# Patient Record
Sex: Male | Born: 1945 | Race: White | Hispanic: No | Smoking: Current every day smoker
Health system: Southern US, Community
[De-identification: ages and names within clinical notes are randomized; demographics above are authoritative.]

## PROBLEM LIST (undated history)

## (undated) DIAGNOSIS — Z8719 Personal history of other diseases of the digestive system: Secondary | ICD-10-CM

## (undated) DIAGNOSIS — Z720 Tobacco use: Secondary | ICD-10-CM

## (undated) DIAGNOSIS — I499 Cardiac arrhythmia, unspecified: Secondary | ICD-10-CM

## (undated) DIAGNOSIS — I951 Orthostatic hypotension: Secondary | ICD-10-CM

## (undated) DIAGNOSIS — R600 Localized edema: Secondary | ICD-10-CM

## (undated) DIAGNOSIS — C8590 Non-Hodgkin lymphoma, unspecified, unspecified site: Secondary | ICD-10-CM

## (undated) DIAGNOSIS — K219 Gastro-esophageal reflux disease without esophagitis: Secondary | ICD-10-CM

## (undated) DIAGNOSIS — K08109 Complete loss of teeth, unspecified cause, unspecified class: Secondary | ICD-10-CM

## (undated) DIAGNOSIS — M87052 Idiopathic aseptic necrosis of left femur: Secondary | ICD-10-CM

## (undated) DIAGNOSIS — J189 Pneumonia, unspecified organism: Secondary | ICD-10-CM

## (undated) DIAGNOSIS — Z8614 Personal history of Methicillin resistant Staphylococcus aureus infection: Secondary | ICD-10-CM

## (undated) DIAGNOSIS — G4733 Obstructive sleep apnea (adult) (pediatric): Secondary | ICD-10-CM

## (undated) DIAGNOSIS — G20 Parkinson's disease: Secondary | ICD-10-CM

## (undated) DIAGNOSIS — E669 Obesity, unspecified: Secondary | ICD-10-CM

## (undated) DIAGNOSIS — D049 Carcinoma in situ of skin, unspecified: Secondary | ICD-10-CM

## (undated) DIAGNOSIS — T8859XA Other complications of anesthesia, initial encounter: Secondary | ICD-10-CM

## (undated) DIAGNOSIS — M51369 Other intervertebral disc degeneration, lumbar region without mention of lumbar back pain or lower extremity pain: Secondary | ICD-10-CM

## (undated) DIAGNOSIS — T4145XA Adverse effect of unspecified anesthetic, initial encounter: Secondary | ICD-10-CM

## (undated) DIAGNOSIS — R519 Headache, unspecified: Secondary | ICD-10-CM

## (undated) DIAGNOSIS — M5136 Other intervertebral disc degeneration, lumbar region: Secondary | ICD-10-CM

## (undated) DIAGNOSIS — Z9889 Other specified postprocedural states: Secondary | ICD-10-CM

## (undated) DIAGNOSIS — K635 Polyp of colon: Secondary | ICD-10-CM

## (undated) DIAGNOSIS — C801 Malignant (primary) neoplasm, unspecified: Secondary | ICD-10-CM

## (undated) DIAGNOSIS — M87051 Idiopathic aseptic necrosis of right femur: Secondary | ICD-10-CM

## (undated) DIAGNOSIS — K8050 Calculus of bile duct without cholangitis or cholecystitis without obstruction: Secondary | ICD-10-CM

## (undated) DIAGNOSIS — I4891 Unspecified atrial fibrillation: Secondary | ICD-10-CM

## (undated) DIAGNOSIS — G20A1 Parkinson's disease without dyskinesia, without mention of fluctuations: Secondary | ICD-10-CM

## (undated) DIAGNOSIS — R112 Nausea with vomiting, unspecified: Secondary | ICD-10-CM

## (undated) DIAGNOSIS — D733 Abscess of spleen: Secondary | ICD-10-CM

## (undated) DIAGNOSIS — Z9981 Dependence on supplemental oxygen: Secondary | ICD-10-CM

## (undated) DIAGNOSIS — G709 Myoneural disorder, unspecified: Secondary | ICD-10-CM

## (undated) DIAGNOSIS — J449 Chronic obstructive pulmonary disease, unspecified: Secondary | ICD-10-CM

## (undated) HISTORY — PX: EYE SURGERY: SHX253

## (undated) HISTORY — PX: CATARACT EXTRACTION: SUR2

## (undated) HISTORY — PX: KNEE ARTHROSCOPY: SUR90

## (undated) HISTORY — DX: Obesity, unspecified: E66.9

## (undated) HISTORY — DX: Idiopathic aseptic necrosis of left femur: M87.052

## (undated) HISTORY — DX: Localized edema: R60.0

## (undated) HISTORY — DX: Other intervertebral disc degeneration, lumbar region: M51.36

## (undated) HISTORY — DX: Orthostatic hypotension: I95.1

## (undated) HISTORY — DX: Unspecified atrial fibrillation: I48.91

## (undated) HISTORY — DX: Chronic obstructive pulmonary disease, unspecified: J44.9

## (undated) HISTORY — DX: Abscess of spleen: D73.3

## (undated) HISTORY — PX: TONSILLECTOMY: SUR1361

## (undated) HISTORY — DX: Obstructive sleep apnea (adult) (pediatric): G47.33

## (undated) HISTORY — DX: Tobacco use: Z72.0

## (undated) HISTORY — DX: Idiopathic aseptic necrosis of right femur: M87.051

## (undated) HISTORY — PX: MULTIPLE TOOTH EXTRACTIONS: SHX2053

## (undated) HISTORY — DX: Personal history of Methicillin resistant Staphylococcus aureus infection: Z86.14

## (undated) HISTORY — DX: Non-Hodgkin lymphoma, unspecified, unspecified site: C85.90

## (undated) HISTORY — PX: ANAL FISSURE REPAIR: SHX2312

## (undated) HISTORY — DX: Other intervertebral disc degeneration, lumbar region without mention of lumbar back pain or lower extremity pain: M51.369

## (undated) HISTORY — DX: Gastro-esophageal reflux disease without esophagitis: K21.9

## (undated) HISTORY — DX: Polyp of colon: K63.5

---

## 2001-01-04 ENCOUNTER — Encounter: Payer: Self-pay | Admitting: *Deleted

## 2001-01-04 ENCOUNTER — Encounter: Admission: RE | Admit: 2001-01-04 | Discharge: 2001-01-04 | Payer: Self-pay | Admitting: *Deleted

## 2003-09-05 ENCOUNTER — Encounter: Payer: Self-pay | Admitting: Ophthalmology

## 2003-09-05 ENCOUNTER — Ambulatory Visit (HOSPITAL_COMMUNITY): Admission: RE | Admit: 2003-09-05 | Discharge: 2003-09-05 | Payer: Self-pay | Admitting: Ophthalmology

## 2003-10-11 ENCOUNTER — Ambulatory Visit (HOSPITAL_BASED_OUTPATIENT_CLINIC_OR_DEPARTMENT_OTHER): Admission: RE | Admit: 2003-10-11 | Discharge: 2003-10-11 | Payer: Self-pay | Admitting: Neurology

## 2003-11-25 ENCOUNTER — Encounter (INDEPENDENT_AMBULATORY_CARE_PROVIDER_SITE_OTHER): Payer: Self-pay | Admitting: *Deleted

## 2003-11-25 ENCOUNTER — Ambulatory Visit (HOSPITAL_COMMUNITY): Admission: RE | Admit: 2003-11-25 | Discharge: 2003-11-25 | Payer: Self-pay | Admitting: *Deleted

## 2004-01-27 ENCOUNTER — Ambulatory Visit (HOSPITAL_COMMUNITY): Admission: RE | Admit: 2004-01-27 | Discharge: 2004-01-27 | Payer: Self-pay | Admitting: Pulmonary Disease

## 2004-09-26 ENCOUNTER — Ambulatory Visit (HOSPITAL_COMMUNITY): Admission: RE | Admit: 2004-09-26 | Discharge: 2004-09-26 | Payer: Self-pay

## 2004-12-13 ENCOUNTER — Ambulatory Visit: Payer: Self-pay | Admitting: Internal Medicine

## 2004-12-13 ENCOUNTER — Ambulatory Visit (HOSPITAL_COMMUNITY): Admission: RE | Admit: 2004-12-13 | Discharge: 2004-12-13 | Payer: Self-pay | Admitting: Internal Medicine

## 2004-12-28 ENCOUNTER — Ambulatory Visit (HOSPITAL_COMMUNITY): Admission: RE | Admit: 2004-12-28 | Discharge: 2004-12-28 | Payer: Self-pay | Admitting: Internal Medicine

## 2005-01-11 ENCOUNTER — Ambulatory Visit: Payer: Self-pay | Admitting: Internal Medicine

## 2005-06-06 ENCOUNTER — Ambulatory Visit: Payer: Self-pay | Admitting: Internal Medicine

## 2005-09-07 ENCOUNTER — Other Ambulatory Visit: Admission: RE | Admit: 2005-09-07 | Discharge: 2005-09-07 | Payer: Self-pay | Admitting: Dermatology

## 2005-11-28 ENCOUNTER — Ambulatory Visit: Payer: Self-pay | Admitting: Internal Medicine

## 2005-12-07 ENCOUNTER — Other Ambulatory Visit: Admission: RE | Admit: 2005-12-07 | Discharge: 2005-12-07 | Payer: Self-pay | Admitting: Dermatology

## 2006-04-24 ENCOUNTER — Ambulatory Visit (HOSPITAL_COMMUNITY): Admission: RE | Admit: 2006-04-24 | Discharge: 2006-04-24 | Payer: Self-pay | Admitting: Pulmonary Disease

## 2006-05-29 ENCOUNTER — Ambulatory Visit: Payer: Self-pay | Admitting: Internal Medicine

## 2006-06-07 ENCOUNTER — Ambulatory Visit (HOSPITAL_COMMUNITY): Admission: RE | Admit: 2006-06-07 | Discharge: 2006-06-07 | Payer: Self-pay | Admitting: Pulmonary Disease

## 2006-07-05 ENCOUNTER — Ambulatory Visit (HOSPITAL_COMMUNITY): Admission: RE | Admit: 2006-07-05 | Discharge: 2006-07-05 | Payer: Self-pay | Admitting: Pulmonary Disease

## 2006-12-25 HISTORY — PX: COLONOSCOPY W/ POLYPECTOMY: SHX1380

## 2006-12-28 ENCOUNTER — Ambulatory Visit: Payer: Self-pay | Admitting: Internal Medicine

## 2007-02-28 ENCOUNTER — Ambulatory Visit (HOSPITAL_COMMUNITY): Admission: RE | Admit: 2007-02-28 | Discharge: 2007-02-28 | Payer: Self-pay | Admitting: Internal Medicine

## 2007-02-28 ENCOUNTER — Encounter (INDEPENDENT_AMBULATORY_CARE_PROVIDER_SITE_OTHER): Payer: Self-pay | Admitting: *Deleted

## 2007-02-28 ENCOUNTER — Ambulatory Visit: Payer: Self-pay | Admitting: Internal Medicine

## 2008-12-25 DIAGNOSIS — Z8614 Personal history of Methicillin resistant Staphylococcus aureus infection: Secondary | ICD-10-CM

## 2008-12-25 HISTORY — PX: LYMPH NODE BIOPSY: SHX201

## 2008-12-25 HISTORY — DX: Personal history of Methicillin resistant Staphylococcus aureus infection: Z86.14

## 2008-12-25 HISTORY — PX: ABSCESS DRAINAGE: SHX1119

## 2009-06-24 DIAGNOSIS — C8590 Non-Hodgkin lymphoma, unspecified, unspecified site: Secondary | ICD-10-CM

## 2009-06-24 HISTORY — DX: Non-Hodgkin lymphoma, unspecified, unspecified site: C85.90

## 2009-07-17 ENCOUNTER — Inpatient Hospital Stay (HOSPITAL_COMMUNITY): Admission: EM | Admit: 2009-07-17 | Discharge: 2009-07-23 | Payer: Self-pay | Admitting: Emergency Medicine

## 2009-07-22 ENCOUNTER — Encounter: Payer: Self-pay | Admitting: Gastroenterology

## 2009-07-23 ENCOUNTER — Ambulatory Visit: Payer: Self-pay | Admitting: Internal Medicine

## 2009-07-23 ENCOUNTER — Ambulatory Visit: Payer: Self-pay | Admitting: Hematology and Oncology

## 2009-07-25 DIAGNOSIS — D733 Abscess of spleen: Secondary | ICD-10-CM

## 2009-07-25 HISTORY — DX: Abscess of spleen: D73.3

## 2009-07-27 ENCOUNTER — Encounter: Payer: Self-pay | Admitting: Gastroenterology

## 2009-07-28 ENCOUNTER — Ambulatory Visit: Payer: Self-pay | Admitting: Hematology and Oncology

## 2009-07-28 ENCOUNTER — Encounter: Payer: Self-pay | Admitting: Gastroenterology

## 2009-08-04 ENCOUNTER — Ambulatory Visit (HOSPITAL_COMMUNITY): Admission: RE | Admit: 2009-08-04 | Discharge: 2009-08-04 | Payer: Self-pay | Admitting: Pulmonary Disease

## 2009-08-04 ENCOUNTER — Ambulatory Visit (HOSPITAL_COMMUNITY): Admission: RE | Admit: 2009-08-04 | Discharge: 2009-08-04 | Payer: Self-pay | Admitting: Hematology and Oncology

## 2009-08-13 ENCOUNTER — Encounter (INDEPENDENT_AMBULATORY_CARE_PROVIDER_SITE_OTHER): Payer: Self-pay | Admitting: Interventional Radiology

## 2009-08-13 ENCOUNTER — Encounter: Payer: Self-pay | Admitting: Hematology and Oncology

## 2009-08-13 ENCOUNTER — Inpatient Hospital Stay (HOSPITAL_COMMUNITY): Admission: AD | Admit: 2009-08-13 | Discharge: 2009-08-19 | Payer: Self-pay | Admitting: Oncology

## 2009-08-13 ENCOUNTER — Ambulatory Visit: Payer: Self-pay | Admitting: Oncology

## 2009-08-14 ENCOUNTER — Ambulatory Visit: Payer: Self-pay | Admitting: Oncology

## 2009-08-20 ENCOUNTER — Ambulatory Visit (HOSPITAL_COMMUNITY): Admission: RE | Admit: 2009-08-20 | Discharge: 2009-08-20 | Payer: Self-pay | Admitting: Oncology

## 2009-08-20 ENCOUNTER — Encounter (INDEPENDENT_AMBULATORY_CARE_PROVIDER_SITE_OTHER): Payer: Self-pay | Admitting: Interventional Radiology

## 2009-08-24 ENCOUNTER — Encounter: Payer: Self-pay | Admitting: Gastroenterology

## 2009-08-24 LAB — CBC WITH DIFFERENTIAL/PLATELET
BASO%: 0.3 % (ref 0.0–2.0)
Eosinophils Absolute: 0.2 10*3/uL (ref 0.0–0.5)
LYMPH%: 22.7 % (ref 14.0–49.0)
MCHC: 33.9 g/dL (ref 32.0–36.0)
MCV: 88.3 fL (ref 79.3–98.0)
MONO%: 6.4 % (ref 0.0–14.0)
Platelets: 301 10*3/uL (ref 140–400)
RBC: 4.77 10*6/uL (ref 4.20–5.82)

## 2009-08-24 LAB — COMPREHENSIVE METABOLIC PANEL
ALT: 21 U/L (ref 0–53)
AST: 14 U/L (ref 0–37)
Albumin: 3.8 g/dL (ref 3.5–5.2)
Alkaline Phosphatase: 95 U/L (ref 39–117)
Potassium: 4.2 mEq/L (ref 3.5–5.3)
Sodium: 140 mEq/L (ref 135–145)
Total Protein: 7.3 g/dL (ref 6.0–8.3)

## 2009-08-24 LAB — URIC ACID: Uric Acid, Serum: 5.1 mg/dL (ref 4.0–7.8)

## 2009-08-27 ENCOUNTER — Ambulatory Visit: Payer: Self-pay | Admitting: Oncology

## 2009-09-01 ENCOUNTER — Encounter: Admission: RE | Admit: 2009-09-01 | Discharge: 2009-09-01 | Payer: Self-pay | Admitting: Oncology

## 2009-09-06 ENCOUNTER — Ambulatory Visit (HOSPITAL_COMMUNITY): Admission: RE | Admit: 2009-09-06 | Discharge: 2009-09-06 | Payer: Self-pay | Admitting: Oncology

## 2009-09-06 ENCOUNTER — Encounter (HOSPITAL_COMMUNITY): Payer: Self-pay | Admitting: Oncology

## 2009-09-09 ENCOUNTER — Ambulatory Visit (HOSPITAL_COMMUNITY): Admission: RE | Admit: 2009-09-09 | Discharge: 2009-09-09 | Payer: Self-pay | Admitting: Interventional Radiology

## 2009-09-09 ENCOUNTER — Encounter (HOSPITAL_COMMUNITY): Payer: Self-pay | Admitting: Oncology

## 2009-09-09 ENCOUNTER — Ambulatory Visit: Admission: RE | Admit: 2009-09-09 | Discharge: 2009-09-09 | Payer: Self-pay | Admitting: Oncology

## 2009-09-10 LAB — COMPREHENSIVE METABOLIC PANEL
AST: 14 U/L (ref 0–37)
Albumin: 3.7 g/dL (ref 3.5–5.2)
BUN: 18 mg/dL (ref 6–23)
Calcium: 9.1 mg/dL (ref 8.4–10.5)
Chloride: 106 mEq/L (ref 96–112)
Glucose, Bld: 113 mg/dL — ABNORMAL HIGH (ref 70–99)
Potassium: 4.1 mEq/L (ref 3.5–5.3)
Sodium: 139 mEq/L (ref 135–145)
Total Protein: 7.2 g/dL (ref 6.0–8.3)

## 2009-09-10 LAB — CBC WITH DIFFERENTIAL/PLATELET
EOS%: 7.6 % — ABNORMAL HIGH (ref 0.0–7.0)
HCT: 42.4 % (ref 38.4–49.9)
LYMPH%: 17.4 % (ref 14.0–49.0)
MCH: 29 pg (ref 27.2–33.4)
MCHC: 34 g/dL (ref 32.0–36.0)
MCV: 85.5 fL (ref 79.3–98.0)
MONO#: 1.1 10*3/uL — ABNORMAL HIGH (ref 0.1–0.9)
MONO%: 9.4 % (ref 0.0–14.0)
NEUT%: 65 % (ref 39.0–75.0)
Platelets: 236 10*3/uL (ref 140–400)
RBC: 4.96 10*6/uL (ref 4.20–5.82)
WBC: 11.7 10*3/uL — ABNORMAL HIGH (ref 4.0–10.3)

## 2009-09-10 LAB — URIC ACID: Uric Acid, Serum: 4.7 mg/dL (ref 4.0–7.8)

## 2009-09-17 LAB — CBC WITH DIFFERENTIAL/PLATELET
BASO%: 0.5 % (ref 0.0–2.0)
Basophils Absolute: 0 10*3/uL (ref 0.0–0.1)
EOS%: 6.2 % (ref 0.0–7.0)
HGB: 13.6 g/dL (ref 13.0–17.1)
MCH: 28.7 pg (ref 27.2–33.4)
MCHC: 34 g/dL (ref 32.0–36.0)
RDW: 14.6 % (ref 11.0–14.6)
WBC: 6 10*3/uL (ref 4.0–10.3)
lymph#: 1.8 10*3/uL (ref 0.9–3.3)

## 2009-09-23 ENCOUNTER — Ambulatory Visit: Payer: Self-pay | Admitting: Oncology

## 2009-09-27 LAB — CBC WITH DIFFERENTIAL/PLATELET
Basophils Absolute: 0.1 10*3/uL (ref 0.0–0.1)
EOS%: 0.4 % (ref 0.0–7.0)
LYMPH%: 16.2 % (ref 14.0–49.0)
MCH: 28.2 pg (ref 27.2–33.4)
MCV: 87 fL (ref 79.3–98.0)
MONO%: 8.1 % (ref 0.0–14.0)
RBC: 4.4 10*6/uL (ref 4.20–5.82)
RDW: 16 % — ABNORMAL HIGH (ref 11.0–14.6)
nRBC: 0 % (ref 0–0)

## 2009-09-27 LAB — URIC ACID: Uric Acid, Serum: 5.3 mg/dL (ref 4.0–7.8)

## 2009-09-27 LAB — COMPREHENSIVE METABOLIC PANEL
Alkaline Phosphatase: 77 U/L (ref 39–117)
Creatinine, Ser: 0.86 mg/dL (ref 0.40–1.50)
Glucose, Bld: 93 mg/dL (ref 70–99)
Sodium: 143 mEq/L (ref 135–145)
Total Bilirubin: 0.4 mg/dL (ref 0.3–1.2)
Total Protein: 6.2 g/dL (ref 6.0–8.3)

## 2009-10-04 LAB — CBC WITH DIFFERENTIAL/PLATELET
BASO%: 0.8 % (ref 0.0–2.0)
Basophils Absolute: 0 10*3/uL (ref 0.0–0.1)
HCT: 34.6 % — ABNORMAL LOW (ref 38.4–49.9)
HGB: 11.4 g/dL — ABNORMAL LOW (ref 13.0–17.1)
LYMPH%: 46.1 % (ref 14.0–49.0)
MCH: 28 pg (ref 27.2–33.4)
MCHC: 32.9 g/dL (ref 32.0–36.0)
MONO#: 0.2 10*3/uL (ref 0.1–0.9)
NEUT%: 44.5 % (ref 39.0–75.0)
Platelets: 136 10*3/uL — ABNORMAL LOW (ref 140–400)
WBC: 2.6 10*3/uL — ABNORMAL LOW (ref 4.0–10.3)
lymph#: 1.2 10*3/uL (ref 0.9–3.3)

## 2009-10-12 LAB — CBC WITH DIFFERENTIAL/PLATELET
BASO%: 0.6 % (ref 0.0–2.0)
Basophils Absolute: 0.1 10*3/uL (ref 0.0–0.1)
EOS%: 0.2 % (ref 0.0–7.0)
HCT: 38.7 % (ref 38.4–49.9)
HGB: 12.7 g/dL — ABNORMAL LOW (ref 13.0–17.1)
LYMPH%: 15 % (ref 14.0–49.0)
MCH: 28.2 pg (ref 27.2–33.4)
MCHC: 32.8 g/dL (ref 32.0–36.0)
MCV: 86 fL (ref 79.3–98.0)
MONO%: 9.8 % (ref 0.0–14.0)
NEUT%: 74.4 % (ref 39.0–75.0)

## 2009-10-12 LAB — COMPREHENSIVE METABOLIC PANEL
ALT: 14 U/L (ref 0–53)
AST: 10 U/L (ref 0–37)
Alkaline Phosphatase: 85 U/L (ref 39–117)
BUN: 14 mg/dL (ref 6–23)
Calcium: 8.9 mg/dL (ref 8.4–10.5)
Creatinine, Ser: 0.87 mg/dL (ref 0.40–1.50)
Total Bilirubin: 0.3 mg/dL (ref 0.3–1.2)

## 2009-10-19 LAB — CBC WITH DIFFERENTIAL/PLATELET
Basophils Absolute: 0 10*3/uL (ref 0.0–0.1)
Eosinophils Absolute: 0 10*3/uL (ref 0.0–0.5)
HCT: 33.3 % — ABNORMAL LOW (ref 38.4–49.9)
HGB: 11.3 g/dL — ABNORMAL LOW (ref 13.0–17.1)
LYMPH%: 53.8 % — ABNORMAL HIGH (ref 14.0–49.0)
MCV: 85.3 fL (ref 79.3–98.0)
MONO#: 0.2 10*3/uL (ref 0.1–0.9)
MONO%: 8.4 % (ref 0.0–14.0)
NEUT#: 0.7 10*3/uL — ABNORMAL LOW (ref 1.5–6.5)
NEUT%: 35 % — ABNORMAL LOW (ref 39.0–75.0)
Platelets: 132 10*3/uL — ABNORMAL LOW (ref 140–400)
WBC: 2 10*3/uL — ABNORMAL LOW (ref 4.0–10.3)

## 2009-10-22 ENCOUNTER — Ambulatory Visit (HOSPITAL_COMMUNITY): Admission: RE | Admit: 2009-10-22 | Discharge: 2009-10-22 | Payer: Self-pay | Admitting: Oncology

## 2009-10-26 ENCOUNTER — Ambulatory Visit: Payer: Self-pay | Admitting: Oncology

## 2009-10-28 LAB — COMPREHENSIVE METABOLIC PANEL
BUN: 13 mg/dL (ref 6–23)
CO2: 25 mEq/L (ref 19–32)
Calcium: 8.5 mg/dL (ref 8.4–10.5)
Chloride: 106 mEq/L (ref 96–112)
Creatinine, Ser: 0.85 mg/dL (ref 0.40–1.50)
Glucose, Bld: 90 mg/dL (ref 70–99)
Total Bilirubin: 0.2 mg/dL — ABNORMAL LOW (ref 0.3–1.2)

## 2009-10-28 LAB — LACTATE DEHYDROGENASE: LDH: 150 U/L (ref 94–250)

## 2009-10-28 LAB — CBC WITH DIFFERENTIAL/PLATELET
BASO%: 0.4 % (ref 0.0–2.0)
Basophils Absolute: 0.1 10*3/uL (ref 0.0–0.1)
Eosinophils Absolute: 0 10*3/uL (ref 0.0–0.5)
HCT: 36.6 % — ABNORMAL LOW (ref 38.4–49.9)
HGB: 11.9 g/dL — ABNORMAL LOW (ref 13.0–17.1)
LYMPH%: 12.6 % — ABNORMAL LOW (ref 14.0–49.0)
MCHC: 32.5 g/dL (ref 32.0–36.0)
MONO#: 1.1 10*3/uL — ABNORMAL HIGH (ref 0.1–0.9)
NEUT%: 78 % — ABNORMAL HIGH (ref 39.0–75.0)
Platelets: 347 10*3/uL (ref 140–400)
WBC: 12.8 10*3/uL — ABNORMAL HIGH (ref 4.0–10.3)

## 2009-11-04 LAB — CBC WITH DIFFERENTIAL/PLATELET
Basophils Absolute: 0 10*3/uL (ref 0.0–0.1)
HCT: 32.4 % — ABNORMAL LOW (ref 38.4–49.9)
HGB: 10.5 g/dL — ABNORMAL LOW (ref 13.0–17.1)
MONO#: 0.2 10*3/uL (ref 0.1–0.9)
NEUT#: 1.2 10*3/uL — ABNORMAL LOW (ref 1.5–6.5)
NEUT%: 46.1 % (ref 39.0–75.0)
RDW: 18.6 % — ABNORMAL HIGH (ref 11.0–14.6)
WBC: 2.7 10*3/uL — ABNORMAL LOW (ref 4.0–10.3)
lymph#: 1.2 10*3/uL (ref 0.9–3.3)

## 2009-11-15 LAB — COMPREHENSIVE METABOLIC PANEL
ALT: 15 U/L (ref 0–53)
AST: 14 U/L (ref 0–37)
Albumin: 3.9 g/dL (ref 3.5–5.2)
BUN: 18 mg/dL (ref 6–23)
CO2: 22 mEq/L (ref 19–32)
Calcium: 9.3 mg/dL (ref 8.4–10.5)
Chloride: 109 mEq/L (ref 96–112)
Creatinine, Ser: 0.85 mg/dL (ref 0.40–1.50)
Potassium: 4.3 mEq/L (ref 3.5–5.3)

## 2009-11-15 LAB — LACTATE DEHYDROGENASE: LDH: 158 U/L (ref 94–250)

## 2009-11-15 LAB — CBC WITH DIFFERENTIAL/PLATELET
Basophils Absolute: 0.1 10*3/uL (ref 0.0–0.1)
EOS%: 0.6 % (ref 0.0–7.0)
HGB: 12.3 g/dL — ABNORMAL LOW (ref 13.0–17.1)
MCH: 29 pg (ref 27.2–33.4)
MCV: 90.3 fL (ref 79.3–98.0)
MONO%: 10.5 % (ref 0.0–14.0)
NEUT#: 5.9 10*3/uL (ref 1.5–6.5)
RBC: 4.24 10*6/uL (ref 4.20–5.82)
RDW: 22.3 % — ABNORMAL HIGH (ref 11.0–14.6)
lymph#: 1.3 10*3/uL (ref 0.9–3.3)

## 2009-11-22 ENCOUNTER — Ambulatory Visit: Admission: RE | Admit: 2009-11-22 | Discharge: 2009-11-22 | Payer: Self-pay | Admitting: Oncology

## 2009-11-22 ENCOUNTER — Ambulatory Visit: Payer: Self-pay | Admitting: Vascular Surgery

## 2009-11-22 ENCOUNTER — Encounter (HOSPITAL_COMMUNITY): Payer: Self-pay | Admitting: Oncology

## 2009-11-22 LAB — CBC WITH DIFFERENTIAL/PLATELET
BASO%: 0.4 % (ref 0.0–2.0)
Basophils Absolute: 0 10*3/uL (ref 0.0–0.1)
EOS%: 1.5 % (ref 0.0–7.0)
Eosinophils Absolute: 0.1 10*3/uL (ref 0.0–0.5)
HCT: 36.1 % — ABNORMAL LOW (ref 38.4–49.9)
HGB: 11.8 g/dL — ABNORMAL LOW (ref 13.0–17.1)
LYMPH%: 19.6 % (ref 14.0–49.0)
MCH: 29.2 pg (ref 27.2–33.4)
MCHC: 32.7 g/dL (ref 32.0–36.0)
MCV: 89.4 fL (ref 79.3–98.0)
MONO#: 0.2 10*3/uL (ref 0.1–0.9)
MONO%: 3.2 % (ref 0.0–14.0)
NEUT#: 3.5 10*3/uL (ref 1.5–6.5)
NEUT%: 75.3 % — ABNORMAL HIGH (ref 39.0–75.0)
Platelets: 106 10*3/uL — ABNORMAL LOW (ref 140–400)
RBC: 4.04 10*6/uL — ABNORMAL LOW (ref 4.20–5.82)
RDW: 21.3 % — ABNORMAL HIGH (ref 11.0–14.6)
WBC: 4.7 10*3/uL (ref 4.0–10.3)
lymph#: 0.9 10*3/uL (ref 0.9–3.3)
nRBC: 0 % (ref 0–0)

## 2009-11-25 ENCOUNTER — Ambulatory Visit: Payer: Self-pay | Admitting: Oncology

## 2009-11-29 LAB — CBC WITH DIFFERENTIAL/PLATELET
Basophils Absolute: 0.1 10*3/uL (ref 0.0–0.1)
Eosinophils Absolute: 0 10*3/uL (ref 0.0–0.5)
HGB: 13.1 g/dL (ref 13.0–17.1)
LYMPH%: 16.2 % (ref 14.0–49.0)
MCV: 91 fL (ref 79.3–98.0)
MONO%: 12 % (ref 0.0–14.0)
NEUT#: 7 10*3/uL — ABNORMAL HIGH (ref 1.5–6.5)
NEUT%: 70.7 % (ref 39.0–75.0)
Platelets: 218 10*3/uL (ref 140–400)

## 2009-12-08 LAB — CBC WITH DIFFERENTIAL/PLATELET
Eosinophils Absolute: 0.1 10*3/uL (ref 0.0–0.5)
HCT: 40 % (ref 38.4–49.9)
LYMPH%: 14 % (ref 14.0–49.0)
MONO#: 1.7 10*3/uL — ABNORMAL HIGH (ref 0.1–0.9)
NEUT#: 11.3 10*3/uL — ABNORMAL HIGH (ref 1.5–6.5)
NEUT%: 73.5 % (ref 39.0–75.0)
Platelets: 200 10*3/uL (ref 140–400)
RBC: 4.36 10*6/uL (ref 4.20–5.82)
WBC: 15.3 10*3/uL — ABNORMAL HIGH (ref 4.0–10.3)
lymph#: 2.2 10*3/uL (ref 0.9–3.3)

## 2009-12-08 LAB — COMPREHENSIVE METABOLIC PANEL
CO2: 25 mEq/L (ref 19–32)
Creatinine, Ser: 0.85 mg/dL (ref 0.40–1.50)
Glucose, Bld: 97 mg/dL (ref 70–99)
Total Bilirubin: 0.2 mg/dL — ABNORMAL LOW (ref 0.3–1.2)

## 2009-12-22 ENCOUNTER — Ambulatory Visit (HOSPITAL_COMMUNITY): Admission: RE | Admit: 2009-12-22 | Discharge: 2009-12-22 | Payer: Self-pay | Admitting: Oncology

## 2009-12-23 ENCOUNTER — Ambulatory Visit: Payer: Self-pay | Admitting: Oncology

## 2009-12-28 ENCOUNTER — Ambulatory Visit: Payer: Self-pay | Admitting: Oncology

## 2009-12-28 LAB — COMPREHENSIVE METABOLIC PANEL
AST: 18 U/L (ref 0–37)
Albumin: 4 g/dL (ref 3.5–5.2)
BUN: 18 mg/dL (ref 6–23)
Calcium: 9.1 mg/dL (ref 8.4–10.5)
Chloride: 106 mEq/L (ref 96–112)
Glucose, Bld: 88 mg/dL (ref 70–99)
Potassium: 4 mEq/L (ref 3.5–5.3)

## 2009-12-28 LAB — CBC WITH DIFFERENTIAL/PLATELET
BASO%: 0.9 % (ref 0.0–2.0)
Basophils Absolute: 0.1 10*3/uL (ref 0.0–0.1)
Eosinophils Absolute: 0.2 10*3/uL (ref 0.0–0.5)
MCH: 31 pg (ref 27.2–33.4)
MCHC: 33.3 g/dL (ref 32.0–36.0)
MCV: 93.3 fL (ref 79.3–98.0)
NEUT#: 6.1 10*3/uL (ref 1.5–6.5)
NEUT%: 68.9 % (ref 39.0–75.0)
Platelets: 234 10*3/uL (ref 140–400)
RBC: 4.45 10*6/uL (ref 4.20–5.82)
RDW: 18.9 % — ABNORMAL HIGH (ref 11.0–14.6)

## 2010-01-12 LAB — CBC WITH DIFFERENTIAL/PLATELET
BASO%: 2.9 % — ABNORMAL HIGH (ref 0.0–2.0)
EOS%: 0.7 % (ref 0.0–7.0)
Eosinophils Absolute: 0.1 10*3/uL (ref 0.0–0.5)
MCHC: 33.2 g/dL (ref 32.0–36.0)
MCV: 96.6 fL (ref 79.3–98.0)
MONO%: 7.6 % (ref 0.0–14.0)
NEUT#: 9 10*3/uL — ABNORMAL HIGH (ref 1.5–6.5)
RBC: 4.21 10*6/uL (ref 4.20–5.82)
RDW: 19.5 % — ABNORMAL HIGH (ref 11.0–14.6)

## 2010-01-19 LAB — CBC WITH DIFFERENTIAL/PLATELET
BASO%: 0.9 % (ref 0.0–2.0)
Eosinophils Absolute: 0.2 10*3/uL (ref 0.0–0.5)
MONO#: 1.2 10*3/uL — ABNORMAL HIGH (ref 0.1–0.9)
NEUT#: 6.1 10*3/uL (ref 1.5–6.5)
Platelets: 220 10*3/uL (ref 140–400)
RBC: 4.44 10*6/uL (ref 4.20–5.82)
RDW: 16.9 % — ABNORMAL HIGH (ref 11.0–14.6)
WBC: 9.2 10*3/uL (ref 4.0–10.3)
lymph#: 1.6 10*3/uL (ref 0.9–3.3)

## 2010-02-07 ENCOUNTER — Ambulatory Visit: Payer: Self-pay | Admitting: Oncology

## 2010-02-09 LAB — COMPREHENSIVE METABOLIC PANEL
ALT: 18 U/L (ref 0–53)
Albumin: 4.4 g/dL (ref 3.5–5.2)
BUN: 19 mg/dL (ref 6–23)
CO2: 26 mEq/L (ref 19–32)
Calcium: 9.9 mg/dL (ref 8.4–10.5)
Chloride: 107 mEq/L (ref 96–112)
Creatinine, Ser: 0.87 mg/dL (ref 0.40–1.50)

## 2010-02-09 LAB — CBC WITH DIFFERENTIAL/PLATELET
Basophils Absolute: 0.1 10*3/uL (ref 0.0–0.1)
Eosinophils Absolute: 0.3 10*3/uL (ref 0.0–0.5)
HCT: 42 % (ref 38.4–49.9)
HGB: 13.9 g/dL (ref 13.0–17.1)
LYMPH%: 17.8 % (ref 14.0–49.0)
MCV: 97 fL (ref 79.3–98.0)
MONO%: 9.7 % (ref 0.0–14.0)
NEUT#: 6 10*3/uL (ref 1.5–6.5)
Platelets: 188 10*3/uL (ref 140–400)

## 2010-02-14 ENCOUNTER — Ambulatory Visit (HOSPITAL_COMMUNITY): Admission: RE | Admit: 2010-02-14 | Discharge: 2010-02-14 | Payer: Self-pay | Admitting: Oncology

## 2010-03-17 ENCOUNTER — Encounter (INDEPENDENT_AMBULATORY_CARE_PROVIDER_SITE_OTHER): Payer: Self-pay

## 2010-03-29 ENCOUNTER — Ambulatory Visit: Payer: Self-pay | Admitting: Oncology

## 2010-03-31 LAB — COMPREHENSIVE METABOLIC PANEL
AST: 14 U/L (ref 0–37)
Albumin: 4.3 g/dL (ref 3.5–5.2)
Alkaline Phosphatase: 59 U/L (ref 39–117)
BUN: 17 mg/dL (ref 6–23)
Creatinine, Ser: 0.72 mg/dL (ref 0.40–1.50)
Potassium: 4 mEq/L (ref 3.5–5.3)

## 2010-03-31 LAB — CBC WITH DIFFERENTIAL/PLATELET
BASO%: 0.5 % (ref 0.0–2.0)
EOS%: 2.2 % (ref 0.0–7.0)
HCT: 40.6 % (ref 38.4–49.9)
MCH: 33.1 pg (ref 27.2–33.4)
MCHC: 34.1 g/dL (ref 32.0–36.0)
NEUT%: 64.7 % (ref 39.0–75.0)
lymph#: 1.4 10*3/uL (ref 0.9–3.3)

## 2010-05-06 ENCOUNTER — Ambulatory Visit: Payer: Self-pay | Admitting: Oncology

## 2010-05-09 ENCOUNTER — Ambulatory Visit (HOSPITAL_COMMUNITY): Admission: RE | Admit: 2010-05-09 | Discharge: 2010-05-09 | Payer: Self-pay | Admitting: Oncology

## 2010-05-09 LAB — CBC WITH DIFFERENTIAL/PLATELET
Eosinophils Absolute: 0.2 10*3/uL (ref 0.0–0.5)
HGB: 13.7 g/dL (ref 13.0–17.1)
MONO#: 0.9 10*3/uL (ref 0.1–0.9)
NEUT#: 2.9 10*3/uL (ref 1.5–6.5)
RBC: 4.32 10*6/uL (ref 4.20–5.82)
RDW: 13.4 % (ref 11.0–14.6)
WBC: 5.4 10*3/uL (ref 4.0–10.3)
lymph#: 1.3 10*3/uL (ref 0.9–3.3)

## 2010-05-09 LAB — COMPREHENSIVE METABOLIC PANEL
Albumin: 4.1 g/dL (ref 3.5–5.2)
Alkaline Phosphatase: 64 U/L (ref 39–117)
CO2: 22 mEq/L (ref 19–32)
Calcium: 9 mg/dL (ref 8.4–10.5)
Chloride: 108 mEq/L (ref 96–112)
Glucose, Bld: 82 mg/dL (ref 70–99)
Potassium: 4 mEq/L (ref 3.5–5.3)
Sodium: 141 mEq/L (ref 135–145)
Total Protein: 6.1 g/dL (ref 6.0–8.3)

## 2010-05-09 LAB — SEDIMENTATION RATE: Sed Rate: 10 mm/hr (ref 0–16)

## 2010-05-09 LAB — LACTATE DEHYDROGENASE: LDH: 146 U/L (ref 94–250)

## 2010-05-20 IMAGING — CR DG CHEST 2V
3 series · 3 of 3 positions shown · non-contrast
Comparison: 07/17/2009

CLINICAL DATA: Fever, chills, leukocytosis, history pancreatic
carcinoma, sleep apnea, smoking

CHEST - 2 VIEW

[view not recorded (1 of 3)]
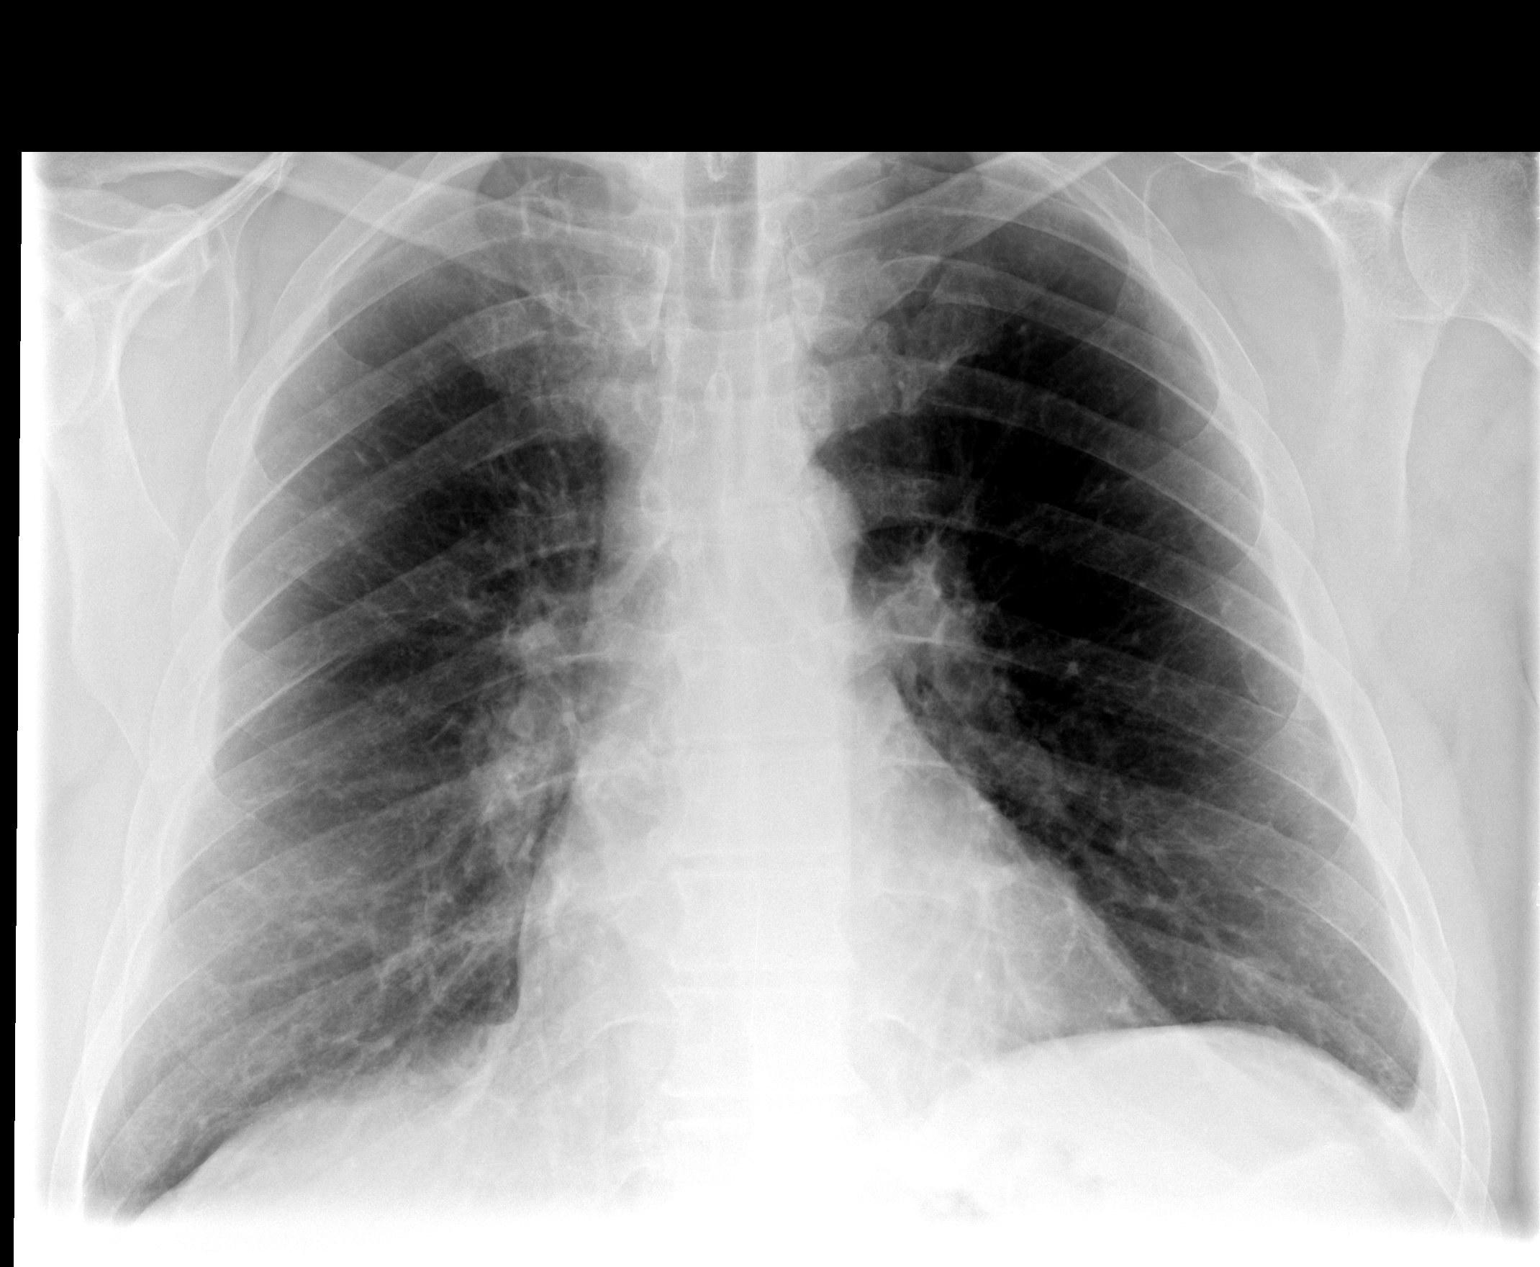

[view not recorded (2 of 3)]
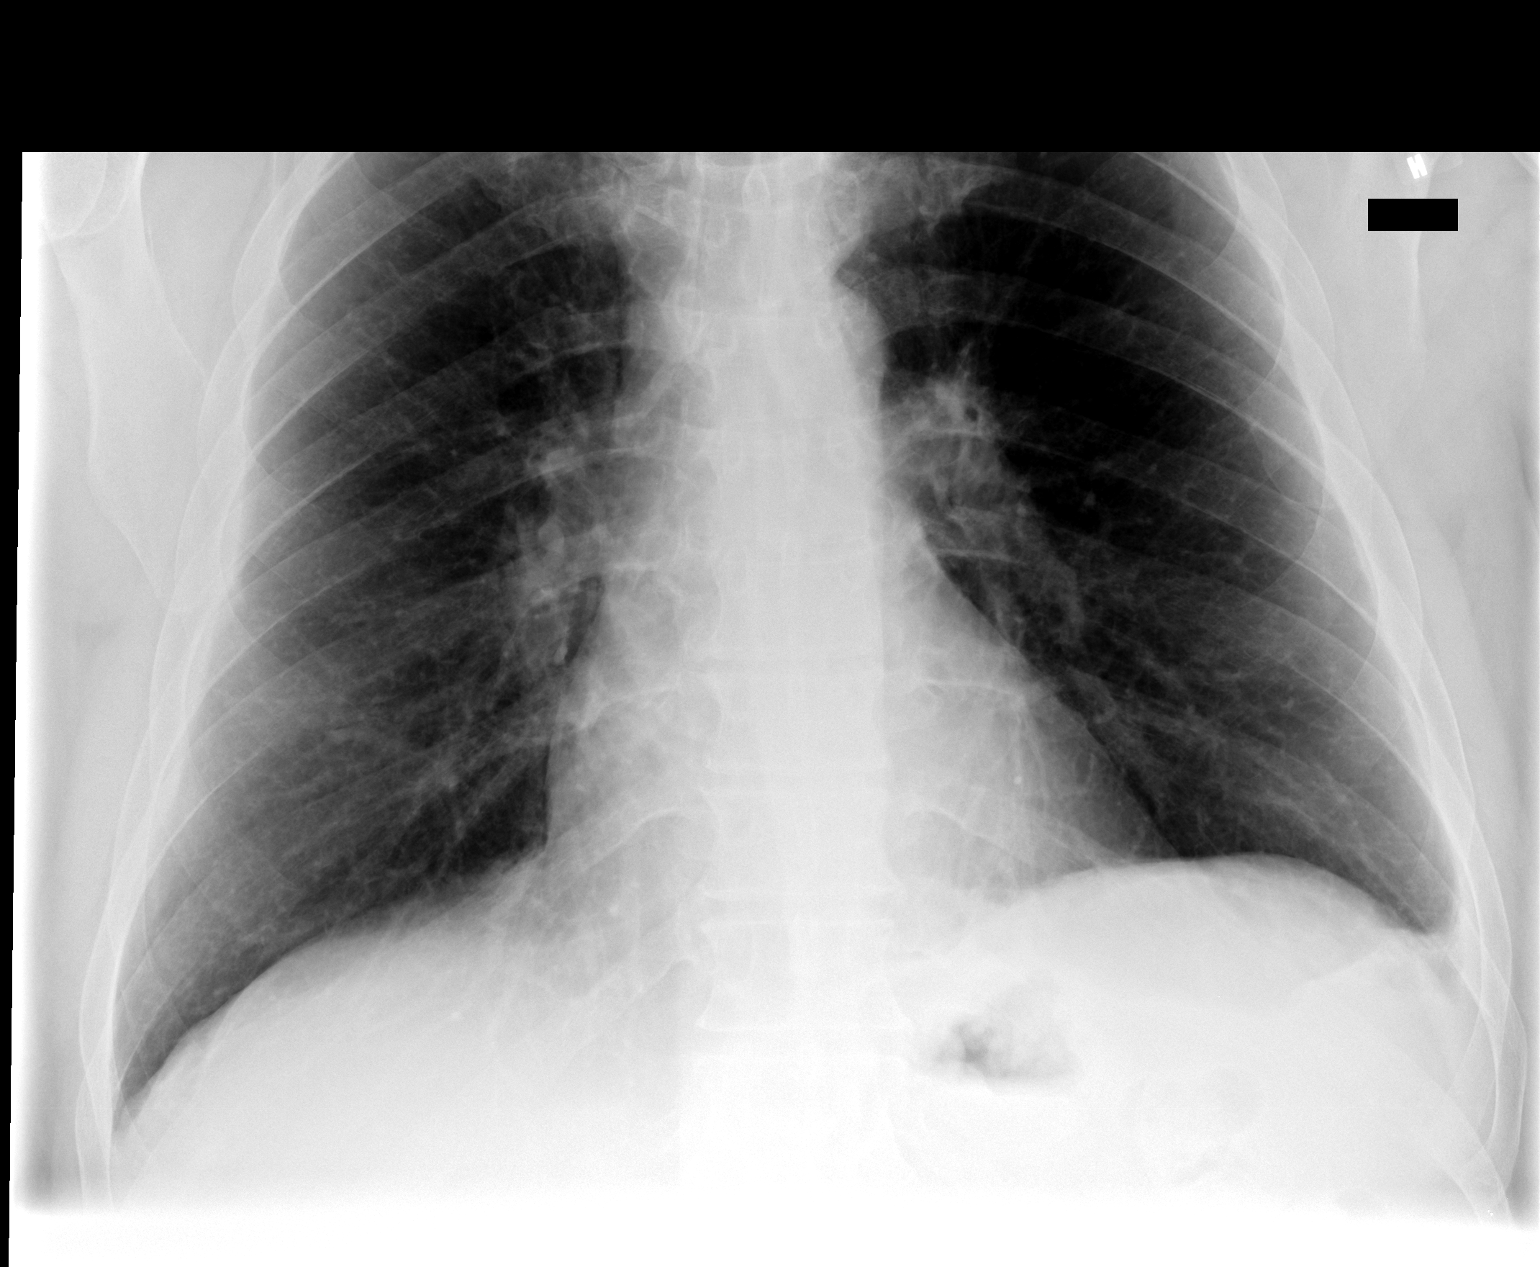

[view not recorded (3 of 3)]
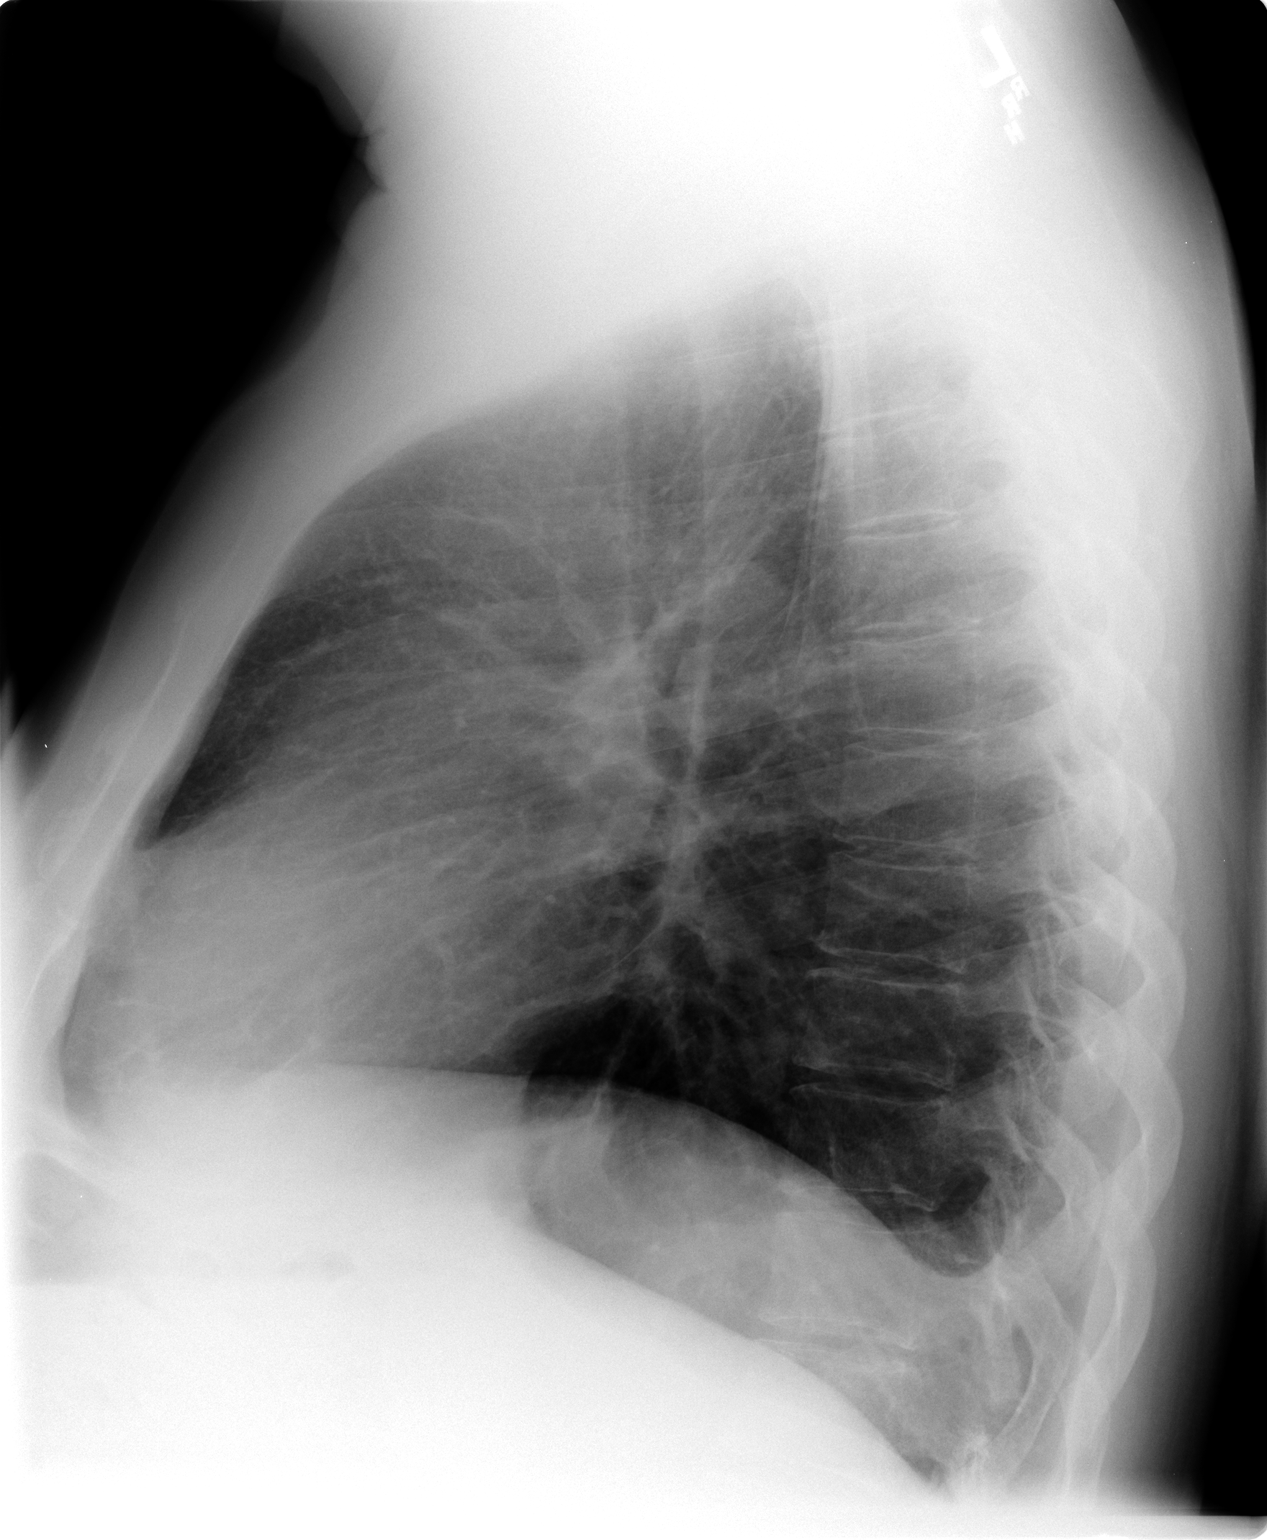

[3 of 3 positions shown; findings below may reference images not displayed]

FINDINGS: Upper normal heart size.
Normal mediastinal contours and pulmonary vascularity.
Mild bronchitic changes, little changed.
No pulmonary infiltrate, right pleural effusion, or pneumothorax.
Small left pleural effusion.
No acute bony abnormalities.
IMPRESSION: Bronchitic changes.
Small left pleural effusion.

## 2010-07-08 ENCOUNTER — Ambulatory Visit: Payer: Self-pay | Admitting: Oncology

## 2010-07-12 LAB — CBC WITH DIFFERENTIAL/PLATELET
Basophils Absolute: 0 10*3/uL (ref 0.0–0.1)
EOS%: 3.2 % (ref 0.0–7.0)
HCT: 42.1 % (ref 38.4–49.9)
HGB: 14.2 g/dL (ref 13.0–17.1)
LYMPH%: 21 % (ref 14.0–49.0)
MCH: 31.7 pg (ref 27.2–33.4)
MCHC: 33.7 g/dL (ref 32.0–36.0)
NEUT%: 62.3 % (ref 39.0–75.0)
Platelets: 192 10*3/uL (ref 140–400)
lymph#: 1.4 10*3/uL (ref 0.9–3.3)

## 2010-07-12 LAB — COMPREHENSIVE METABOLIC PANEL
Albumin: 4.2 g/dL (ref 3.5–5.2)
Alkaline Phosphatase: 56 U/L (ref 39–117)
BUN: 16 mg/dL (ref 6–23)
Calcium: 9.4 mg/dL (ref 8.4–10.5)
Chloride: 107 mEq/L (ref 96–112)
Creatinine, Ser: 0.77 mg/dL (ref 0.40–1.50)
Glucose, Bld: 87 mg/dL (ref 70–99)
Potassium: 4.4 mEq/L (ref 3.5–5.3)

## 2010-09-01 ENCOUNTER — Ambulatory Visit: Payer: Self-pay | Admitting: Oncology

## 2010-09-06 LAB — CBC WITH DIFFERENTIAL/PLATELET
BASO%: 0.5 % (ref 0.0–2.0)
EOS%: 1.9 % (ref 0.0–7.0)
Eosinophils Absolute: 0.1 10*3/uL (ref 0.0–0.5)
LYMPH%: 23.5 % (ref 14.0–49.0)
MCH: 32.5 pg (ref 27.2–33.4)
MCHC: 34.4 g/dL (ref 32.0–36.0)
MCV: 94.5 fL (ref 79.3–98.0)
MONO%: 10.8 % (ref 0.0–14.0)
NEUT#: 4.4 10*3/uL (ref 1.5–6.5)
Platelets: 210 10*3/uL (ref 140–400)
RBC: 4.69 10*6/uL (ref 4.20–5.82)
RDW: 14.5 % (ref 11.0–14.6)

## 2010-09-07 LAB — COMPREHENSIVE METABOLIC PANEL
ALT: 17 U/L (ref 0–53)
AST: 14 U/L (ref 0–37)
Albumin: 4.4 g/dL (ref 3.5–5.2)
Alkaline Phosphatase: 72 U/L (ref 39–117)
Glucose, Bld: 83 mg/dL (ref 70–99)
Potassium: 4.2 mEq/L (ref 3.5–5.3)
Sodium: 142 mEq/L (ref 135–145)
Total Bilirubin: 0.3 mg/dL (ref 0.3–1.2)
Total Protein: 6.5 g/dL (ref 6.0–8.3)

## 2010-09-07 LAB — SEDIMENTATION RATE: Sed Rate: 2 mm/hr (ref 0–16)

## 2010-11-01 ENCOUNTER — Ambulatory Visit: Payer: Self-pay | Admitting: Oncology

## 2010-11-03 ENCOUNTER — Ambulatory Visit (HOSPITAL_COMMUNITY)
Admission: RE | Admit: 2010-11-03 | Discharge: 2010-11-03 | Payer: Self-pay | Source: Home / Self Care | Admitting: Oncology

## 2010-11-03 LAB — CBC WITH DIFFERENTIAL/PLATELET
BASO%: 0.6 % (ref 0.0–2.0)
HCT: 43.3 % (ref 38.4–49.9)
HGB: 15.1 g/dL (ref 13.0–17.1)
MCHC: 34.8 g/dL (ref 32.0–36.0)
MONO#: 0.7 10*3/uL (ref 0.1–0.9)
NEUT%: 71.2 % (ref 39.0–75.0)
RDW: 14.2 % (ref 11.0–14.6)
WBC: 7.9 10*3/uL (ref 4.0–10.3)
lymph#: 1.4 10*3/uL (ref 0.9–3.3)

## 2010-11-03 LAB — COMPREHENSIVE METABOLIC PANEL
BUN: 15 mg/dL (ref 6–23)
CO2: 26 mEq/L (ref 19–32)
Creatinine, Ser: 0.94 mg/dL (ref 0.40–1.50)
Glucose, Bld: 97 mg/dL (ref 70–99)
Total Bilirubin: 0.5 mg/dL (ref 0.3–1.2)

## 2010-11-03 LAB — SEDIMENTATION RATE: Sed Rate: 1 mm/hr (ref 0–16)

## 2010-11-03 LAB — LACTATE DEHYDROGENASE: LDH: 149 U/L (ref 94–250)

## 2010-11-07 ENCOUNTER — Encounter: Payer: Self-pay | Admitting: Gastroenterology

## 2011-01-05 ENCOUNTER — Ambulatory Visit: Payer: Self-pay | Admitting: Oncology

## 2011-01-09 ENCOUNTER — Encounter: Payer: Self-pay | Admitting: *Deleted

## 2011-01-09 LAB — CBC WITH DIFFERENTIAL/PLATELET
BASO%: 0.5 % (ref 0.0–2.0)
Basophils Absolute: 0 10*3/uL (ref 0.0–0.1)
EOS%: 2.2 % (ref 0.0–7.0)
Eosinophils Absolute: 0.2 10*3/uL (ref 0.0–0.5)
HCT: 44 % (ref 38.4–49.9)
HGB: 15.3 g/dL (ref 13.0–17.1)
LYMPH%: 24.8 % (ref 14.0–49.0)
MCH: 31.7 pg (ref 27.2–33.4)
MCHC: 34.8 g/dL (ref 32.0–36.0)
MCV: 91.3 fL (ref 79.3–98.0)
MONO#: 0.5 10*3/uL (ref 0.1–0.9)
MONO%: 6.4 % (ref 0.0–14.0)
NEUT#: 4.8 10*3/uL (ref 1.5–6.5)
NEUT%: 66.1 % (ref 39.0–75.0)
Platelets: 193 10*3/uL (ref 140–400)
RBC: 4.82 10*6/uL (ref 4.20–5.82)
RDW: 13.2 % (ref 11.0–14.6)
WBC: 7.3 10*3/uL (ref 4.0–10.3)
lymph#: 1.8 10*3/uL (ref 0.9–3.3)

## 2011-01-09 LAB — COMPREHENSIVE METABOLIC PANEL
ALT: 24 U/L (ref 0–53)
AST: 18 U/L (ref 0–37)
Albumin: 4.2 g/dL (ref 3.5–5.2)
Alkaline Phosphatase: 55 U/L (ref 39–117)
BUN: 19 mg/dL (ref 6–23)
CO2: 20 mEq/L (ref 19–32)
Calcium: 9.2 mg/dL (ref 8.4–10.5)
Chloride: 110 mEq/L (ref 96–112)
Creatinine, Ser: 0.96 mg/dL (ref 0.40–1.50)
Glucose, Bld: 88 mg/dL (ref 70–99)
Potassium: 4.2 mEq/L (ref 3.5–5.3)
Sodium: 140 mEq/L (ref 135–145)
Total Bilirubin: 0.4 mg/dL (ref 0.3–1.2)
Total Protein: 6.1 g/dL (ref 6.0–8.3)

## 2011-01-09 LAB — CONVERTED CEMR LAB
ALT: 24 units/L
Albumin: 4.2 g/dL
CO2: 20 meq/L
Chloride: 110 meq/L
Glucose, Bld: 88 mg/dL
Potassium: 4.2 meq/L
Sodium: 140 meq/L
Total Protein: 6.1 g/dL

## 2011-01-09 LAB — LACTATE DEHYDROGENASE: LDH: 142 U/L (ref 94–250)

## 2011-01-10 ENCOUNTER — Encounter (INDEPENDENT_AMBULATORY_CARE_PROVIDER_SITE_OTHER): Payer: Self-pay | Admitting: Pulmonary Disease

## 2011-01-10 ENCOUNTER — Encounter: Payer: Self-pay | Admitting: Cardiology

## 2011-01-10 ENCOUNTER — Ambulatory Visit
Admission: RE | Admit: 2011-01-10 | Discharge: 2011-01-10 | Payer: Self-pay | Source: Home / Self Care | Attending: Cardiology | Admitting: Cardiology

## 2011-01-10 ENCOUNTER — Ambulatory Visit (HOSPITAL_COMMUNITY)
Admission: RE | Admit: 2011-01-10 | Discharge: 2011-01-10 | Payer: Self-pay | Source: Home / Self Care | Attending: Pulmonary Disease | Admitting: Pulmonary Disease

## 2011-01-10 DIAGNOSIS — Z87898 Personal history of other specified conditions: Secondary | ICD-10-CM | POA: Insufficient documentation

## 2011-01-10 DIAGNOSIS — I4821 Permanent atrial fibrillation: Secondary | ICD-10-CM | POA: Insufficient documentation

## 2011-01-10 DIAGNOSIS — K219 Gastro-esophageal reflux disease without esophagitis: Secondary | ICD-10-CM | POA: Insufficient documentation

## 2011-01-10 DIAGNOSIS — E119 Type 2 diabetes mellitus without complications: Secondary | ICD-10-CM | POA: Insufficient documentation

## 2011-01-10 DIAGNOSIS — I4891 Unspecified atrial fibrillation: Secondary | ICD-10-CM | POA: Insufficient documentation

## 2011-01-10 DIAGNOSIS — F17200 Nicotine dependence, unspecified, uncomplicated: Secondary | ICD-10-CM | POA: Insufficient documentation

## 2011-01-10 DIAGNOSIS — E669 Obesity, unspecified: Secondary | ICD-10-CM | POA: Insufficient documentation

## 2011-01-10 HISTORY — DX: Permanent atrial fibrillation: I48.21

## 2011-01-10 LAB — CONVERTED CEMR LAB
AST: 16 units/L (ref 0–37)
Albumin: 4.2 g/dL (ref 3.5–5.2)
Alkaline Phosphatase: 54 units/L (ref 39–117)
BUN: 17 mg/dL (ref 6–23)
Basophils Relative: 1 % (ref 0–1)
Calcium: 9.2 mg/dL (ref 8.4–10.5)
Chloride: 108 meq/L (ref 96–112)
Eosinophils Absolute: 0.2 10*3/uL (ref 0.0–0.7)
Lymphs Abs: 3 10*3/uL (ref 0.7–4.0)
Monocytes Relative: 10 % (ref 3–12)
Neutro Abs: 4.2 10*3/uL (ref 1.7–7.7)
Neutrophils Relative %: 51 % (ref 43–77)
Platelets: 207 10*3/uL (ref 150–400)
Potassium: 4.3 meq/L (ref 3.5–5.3)
RBC: 4.63 M/uL (ref 4.22–5.81)
Sodium: 142 meq/L (ref 135–145)
Total Protein: 5.9 g/dL — ABNORMAL LOW (ref 6.0–8.3)
WBC: 8.3 10*3/uL (ref 4.0–10.5)

## 2011-01-11 ENCOUNTER — Encounter: Payer: Self-pay | Admitting: Cardiology

## 2011-01-14 ENCOUNTER — Encounter (HOSPITAL_COMMUNITY): Payer: Self-pay | Admitting: Oncology

## 2011-01-15 ENCOUNTER — Encounter: Payer: Self-pay | Admitting: Physical Medicine & Rehabilitation

## 2011-01-15 ENCOUNTER — Encounter (HOSPITAL_COMMUNITY): Payer: Self-pay | Admitting: Oncology

## 2011-01-16 ENCOUNTER — Encounter: Payer: Self-pay | Admitting: Internal Medicine

## 2011-01-24 NOTE — Letter (Signed)
Summary: Recall, Screening Colonoscopy Only  [facility]  [address]   [place], [place] [address]   Phone: [phone]  Fax: [phone]    March 17, 2010  [name] [address] [place], [place]  [address] September 09, 1946   Dear [name],   Our records indicate it is time to schedule your colonoscopy.     Please call our office at [phone] and ask for the nurse.   Thank you,  Hendricks Limes, LPN Cloria Spring, LPN  [facility] Ph: [phone]   Fax: [phone]

## 2011-01-24 NOTE — Letter (Signed)
Summary: [facility]  [facility]   Imported By: Lennie Odor 11/18/2010 12:26:46  _____________________________________________________________________  External Attachment:    Type:   Image     Comment:   External Document

## 2011-01-26 NOTE — Assessment & Plan Note (Signed)
Summary: PER DR.HAWKINS FOR A-FIB/TG  Medications Added SENNA S 8.6-50 MG TABS (SENNOSIDES-DOCUSATE SODIUM) take 1 tab two times a day      Allergies Added: ! CODEINE  Visit Type:  Initial Consult Referring Provider:  Dr. Arline Sharp Primary Provider:  Dr. Kari Sharp   History of Present Illness: [name] is seen at the kind request of Dr. Arline Sharp for newly identified atrial fibrillation.  [name] has had no cardiovascular disease and has never previously been evaluated by a cardiologist.  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  With those treatments, he developed symptomatic hypotension.  He has some unsteadiness of gait, and has noted recurrent dizziness in recent weeks including one episode of near syncope after he walked to his car.  He does not restrict salt in his diet.  Current Medications (verified): 1)  Lidoderm 5 % Ptch (Lidocaine) .... Apply Q 12hrs 2)  Travatan Z 0.004 % Soln (Travoprost) .... Apply 1 Drop To Each Eye Nightly 3)  Metformin Hcl 500 Mg Tabs (Metformin Hcl) .... Take 1 Tab Two Times A Day 4)  Morphine Sulfate 15 Mg/ml Soln (Morphine Sulfate) .... Every 6 Hrs As Needed For Pain 5)  Senna S 8.6-50 Mg Tabs (Sennosides-Docusate Sodium) .... Take 1 Tab Two Times A Day  Allergies (verified): 1)  ! Codeine  Comments:  Nurse/Medical Assistant: patient didnt bring meds reviewed with patient he is allergic to codeine makes him swell up prescription solution,diabetes,eye drops [facility] [place],morphin [facility] [place]  Past History:  Family History: Last updated: January 26, 2011 Father: Deceased secondary to leukemia Mother: Died at age 40 as a result of CHF Siblings: Total of 16; Sister with carcinoma of the colon; 4 brothers with CHF; one brother due to trauma; 3 sisters of unknown cause;  one sister and infancy due to infectious disease.  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 Tobacco Use - 45 pack years; one pack per day Alcohol Use - no Regular Exercise - no Drug Use - no  Past Medical History: Atrial fibrillation Chronic obstructive pulmonary disease Tobacco abuse-45 pack years; one pack per day Orthostatic hypotension with lightheadedness; no definite loss of consciousness Non-Hodgkin's lymphoma: 06/2009; chemotherapy until 11/2010 Splenic abscess-07/2009 Diabetes mellitus type 2 Obesity Pedal edema Glaucoma Obstructive sleep apnea-treated with CPAP Gastroesophageal reflux disease Degenerative joint disease-HNP of LS spine Colonic polyp  Past Surgical History: Percutaneous drainage of splenic abscess-2010 Colonoscopy with snare polypectomy-2008 Right cataract extraction with lens implant  PFT  Procedure date:  01/27/2004  Findings:                                   PULMONARY FUNCTION TEST   FINDINGS:  1. Spirometry shows a mild ventilatory defect with evidence of air flow     obstruction.  2. Arterial blood gasses are normal.  3. There is no significant bronchodilator effect.  CT Scan  Procedure date:  11/03/2010  Findings:       Clinical Data: Non-Hodgkins lymphoma.    CT ABDOMEN AND PELVIS WITH CONTRAST   The lung bases are clear.  The heart is normal in size.   No pericardial effusion.  The distal esophagus is unremarkable.    The liver demonstrates no abnormalities except for a stable small  right hepatic cyst.  No biliary dilatation.  The gallbladder   appears normal.  The pancreas is normal.  The spleen is normal in   size.  There is a small calcification inferiorly.  There is a small   focal area scarring change likely related to the previous abscess   drainage.  No findings suggest lymphomatous involvement of the   spleen.  The adrenal glands and kidneys are unremarkable and  stable   in appearance.  No mesenteric or retroperitoneal masses or   adenopathy.    The stomach, duodenum, small bowel and colon are unremarkable.   The bladder, prostate gland and seminal vesicles appear normal.  No  pelvic mass or adenopathy.  The aorta demonstrates moderate   atherosclerotic changes but no focal aneurysm or dissection.   The bony structures appear stable.  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    IMPRESSION:   No CT findings to suggest recurrent lymphoma in the abdomen or   pelvis.    Read By:  Cyndie Chime,  M.D.  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 The patient has undergone chemotherapy and radiation   therapy.   Comparison: PET CT 02/14/2010  Findings:    Neck:No hypermetabolic nodes in the neck.    Chest:No hypermetabolic mediastinal or hilar nodes.  No suspicious   pulmonary nodules.    Abdomen / Pelvis:There is no focal residual abnormal hypermetabolic activity within the spleen.  There is small tongue of tissue extending from the spleen measuring 14 mm (image 155)  which is not changed from prior.  No hypermetabolic retroperitoneal, peritoneal, or pelvic lymph nodes.  No abnormal activity within the solid organs.    Skeleton:No focal hypermetabolic activity to suggest skeletal   metastasis.    IMPRESSION:    1.  No focal residual hypermetabolic activity within the spleen to   suggest residual active lymphoma.   2.  No additional evidence of active lymphoma in the chest,   abdomen, or pelvis.    Read By:  Genevive Bi,  M.D.  Carotid Doppler  Procedure date:  06/24/2006  Findings:      Clinical data:   65 year old male with prior history of smoking,   headaches, left eye visual disturbance.   Right carotid system:  No significant atherosclerosis.  The right ICA   peak systolic velocity is 73 cm per  second compared to a CCA velocity of 93 cm per second.  The right ICA/CCA ratio is .79.  End diastolic velocities are not elevated.  The right vertebral is antegrade in flow and patent.   Left carotid system:  No significant atherosclerotic plaque   formation.  The left ICA peak systolic velocity is 92 cm per second   compared to a CCA velocity of 74 cm per second.  The left ICA/CCA ratio is 1.24.  End diastolic velocities are not elevated.  Left   vertebral is also patent and antegrade in flow.   IMPRESSION:   1.  No hemodynamically significant ICA stenosis.   2.  Patent vertebral arteries bilaterally.   3.  No significant carotid atherosclerosis by ultrasound.    Read By:  Sigurd Sos.,  M.D.  Arterial Doppler  Procedure date:  06/24/2006  Findings:       ULTRASOUND LOWER EXTREMITY ARTERIAL SEGMENTAL   Clinical Data:  Decreased pulses in feet.  Question peripheral   vascular disease.   Pulse volume recording shows mildly dampened wave-forms associated with the right ankle.    IMPRESSION:   Slight interval decrease in the ankle brachial indices, with slight   tibial outflow disease seen associated with the right lower   extremity.  No significant inflow stenoses.    ULTRASOUND ABDOMINAL AORTA:   The abdominal aorta is normal in caliber, measuring 2.8 cm in   greatest diameter.  The right kidney measures 12.2 cm in length and the left kidney 13.0 cm in length.  The right common iliac artery   measures 1.4 cm in diameter and the left common iliac artery 1.9 cm in diameter.  There is no hydronephrosis.    IMPRESSION:   Atherosclerotic changes are noted within the abdominal aorta.  No   evidence for an aortic aneurysm.    Read By:  Stoney Bang,  M.D.  EKG  Procedure date:  01/10/2011  Findings:      Atrial fibrillation with controlled ventricular response Heart rate of 85 bpm Otherwise within normal limits No previous tracing for comparison.   Family  History: Father: Deceased secondary to leukemia Mother: Died at age 15 as a result of CHF Siblings: Total of 16; Sister with carcinoma of the colon; 4 brothers with CHF; one brother due to trauma; 3 sisters of unknown cause; one sister and infancy due to infectious disease.  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  All other systems reviewed and are negative.  Vital Signs:  Patient profile:   65 year old male Height:      76 inches Weight:      265 pounds BMI:     32.37 O2 Sat:      98 % on Room air Pulse rate:   82 / minute Pulse (ortho):   111 / minute BP sitting:   106 / 75  (left arm) BP standing:   98 / 64  Vitals Entered By: Dreama Saa, CNA (January 10, 2011 11:08 AM)  O2 Flow:  Room air  Serial Vital Signs/Assessments:  Time      Position  BP       Pulse  Resp  Temp     By 12:21 PM  Lying LA  129/73   88                    Teressa Lower RN 12:21 PM  Sitting   100/76   94                    Teressa Lower RN 12:21 PM  Standing  98/64    111                   Tammy Sanders RN   Physical Exam  General:  Mildly to moderately obese; well-developed; no acute distress: HEENT-[place]/AT; PERRL; EOM intact; conjunctiva and lids nl:  Neck-No JVD; no carotid bruits: Endocrine-No thyromegaly: Lungs-No tachypnea, clear without rales, rhonchi or wheezes: CV-normal PMI; irregular rhythm; distant S1 and S2:;  Abdomen-BS normal; soft and non-tender  without masses or organomegaly: MS-No deformities, cyanosis or clubbing: Neurologic-Nl cranial nerves; symmetric strength and tone: Skin-  Warm, no sig. lesions: Extremities-Nl distal pulses; Trace edema    Impression & Recommendations:  Problem # 1:  ATRIAL FIBRILLATION (ICD-427.31) Patient has conduction system disease with a controlled heart rate despite the absence of AV nodal blocking drugs.  His echocardiogram shows no significant structural heart disease although LV function is low normal.  TSH was normal at 2010.  I doubt hyperthyroidism but will repeat that test.  His only risk factor for thromboembolism is very mild diabetes.  I am inclined to treat him with aspirin rather than full anticoagulation.  Problem # 2:  TOBACCO ABUSE (ICD-305.1) He has been tapering cigarette consumption while taking Chantix over the past 2 months.  This medication was recently discontinued due to adverse GI effects.  He was encouraged to refrain from any cigarette smoking in the future.  Problem # 3:  SLEEP APNEA (ICD-780.57) Sleep apnea could be a contributing cause to atrial fibrillation.  He is apparently receiving adequate therapy for this problem.  Problem # 4:  ORTHOSTATIC HYPOTENSION (ICD-458.0) Patient experienced an episode of near syncope 6 months ago and has had orthostatic dizziness of late.  Vital signs show a significant decrease in systolic blood pressure when assuming the standing position.  He is encouraged to increase the salt in his diet.  If necessary, ProAmatine will be added to his medical regime.  He was advised concerning behavioral measures to control his symptoms and will report syncope should it occur.  Other Orders: T-TSH [id number]) T-Comprehensive Metabolic Panel [phone]) T-CBC w/Diff [phone]) Hemoccult Cards (Take Home) (Hemoccult Cards)  Patient Instructions: 1)  Your physician recommends that you schedule a follow-up appointment in: 2 months 2)  Your physician recommends that you return for lab work in: today 3)  Your physician has asked that you test your stool for blood. It is necessary to  test 3 different stool specimens for accuracy. You will be given 3 hemoccult cards for specimen collection. For each stool specimen, place a small portion of stool sample (from 2 different areas of the stool) into the 2 squares on the card. Close card. Repeat with 2 more stool specimens. Bring the cards back to the office for testing.

## 2011-02-09 ENCOUNTER — Encounter (INDEPENDENT_AMBULATORY_CARE_PROVIDER_SITE_OTHER): Payer: Self-pay | Admitting: *Deleted

## 2011-02-09 ENCOUNTER — Encounter (INDEPENDENT_AMBULATORY_CARE_PROVIDER_SITE_OTHER): Payer: Self-pay

## 2011-02-09 DIAGNOSIS — R195 Other fecal abnormalities: Secondary | ICD-10-CM

## 2011-02-09 LAB — CONVERTED CEMR LAB
OCCULT 1: NEGATIVE
OCCULT 2: NEGATIVE
OCCULT 3: NEGATIVE

## 2011-02-15 NOTE — Miscellaneous (Signed)
Summary: HEMOCCULT CARDS 02/09/2011  Clinical Lists Changes  Observations: Added new observation of HEMOCCULT 3: NEG (02/09/2011 11:38) Added new observation of HEMOCCULT 2: NEG (02/09/2011 11:38) Added new observation of HEMOCCULT 1: NEG (02/09/2011 11:38)

## 2011-02-28 ENCOUNTER — Encounter: Payer: Self-pay | Admitting: *Deleted

## 2011-02-28 DIAGNOSIS — E669 Obesity, unspecified: Secondary | ICD-10-CM

## 2011-02-28 DIAGNOSIS — R600 Localized edema: Secondary | ICD-10-CM

## 2011-03-09 ENCOUNTER — Other Ambulatory Visit (HOSPITAL_COMMUNITY): Payer: Self-pay | Admitting: Oncology

## 2011-03-09 ENCOUNTER — Encounter (HOSPITAL_BASED_OUTPATIENT_CLINIC_OR_DEPARTMENT_OTHER): Payer: Medicare Other | Admitting: Oncology

## 2011-03-09 DIAGNOSIS — C8589 Other specified types of non-Hodgkin lymphoma, extranodal and solid organ sites: Secondary | ICD-10-CM

## 2011-03-09 DIAGNOSIS — C8590 Non-Hodgkin lymphoma, unspecified, unspecified site: Secondary | ICD-10-CM

## 2011-03-09 LAB — CBC WITH DIFFERENTIAL/PLATELET
BASO%: 0.8 % (ref 0.0–2.0)
EOS%: 2.2 % (ref 0.0–7.0)
MCHC: 34.3 g/dL (ref 32.0–36.0)
MONO#: 0.7 10*3/uL (ref 0.1–0.9)
RBC: 4.55 10*6/uL (ref 4.20–5.82)
WBC: 7.1 10*3/uL (ref 4.0–10.3)
lymph#: 1.9 10*3/uL (ref 0.9–3.3)

## 2011-03-09 LAB — COMPREHENSIVE METABOLIC PANEL

## 2011-03-09 LAB — LACTATE DEHYDROGENASE

## 2011-03-10 LAB — COMPREHENSIVE METABOLIC PANEL
Albumin: 4.4 g/dL (ref 3.5–5.2)
Alkaline Phosphatase: 49 U/L (ref 39–117)
BUN: 18 mg/dL (ref 6–23)
Creatinine, Ser: 1.09 mg/dL (ref 0.40–1.50)
Glucose, Bld: 106 mg/dL — ABNORMAL HIGH (ref 70–99)
Potassium: 4.1 mEq/L (ref 3.5–5.3)
Total Bilirubin: 0.4 mg/dL (ref 0.3–1.2)

## 2011-03-13 LAB — GLUCOSE, CAPILLARY: Glucose-Capillary: 109 mg/dL — ABNORMAL HIGH (ref 70–99)

## 2011-03-15 LAB — GLUCOSE, CAPILLARY: Glucose-Capillary: 99 mg/dL (ref 70–99)

## 2011-03-23 ENCOUNTER — Ambulatory Visit: Payer: Self-pay | Admitting: Cardiology

## 2011-03-27 LAB — GLUCOSE, CAPILLARY: Glucose-Capillary: 97 mg/dL (ref 70–99)

## 2011-03-30 LAB — GLUCOSE, CAPILLARY: Glucose-Capillary: 117 mg/dL — ABNORMAL HIGH (ref 70–99)

## 2011-03-31 LAB — GLUCOSE, CAPILLARY
Glucose-Capillary: 106 mg/dL — ABNORMAL HIGH (ref 70–99)
Glucose-Capillary: 96 mg/dL (ref 70–99)

## 2011-03-31 LAB — DIFFERENTIAL
Eosinophils Relative: 6 % — ABNORMAL HIGH (ref 0–5)
Lymphocytes Relative: 16 % (ref 12–46)
Lymphs Abs: 1.8 10*3/uL (ref 0.7–4.0)

## 2011-03-31 LAB — CHROMOSOME ANALYSIS, BONE MARROW

## 2011-03-31 LAB — CBC
HCT: 42.9 % (ref 39.0–52.0)
Hemoglobin: 14.4 g/dL (ref 13.0–17.0)
Platelets: 273 10*3/uL (ref 150–400)
RBC: 4.82 MIL/uL (ref 4.22–5.81)
WBC: 11.1 10*3/uL — ABNORMAL HIGH (ref 4.0–10.5)

## 2011-04-01 LAB — URINALYSIS, ROUTINE W REFLEX MICROSCOPIC
Bilirubin Urine: NEGATIVE
Ketones, ur: NEGATIVE mg/dL
Nitrite: NEGATIVE
Protein, ur: NEGATIVE mg/dL
Urobilinogen, UA: 1 mg/dL (ref 0.0–1.0)

## 2011-04-01 LAB — BASIC METABOLIC PANEL
CO2: 30 mEq/L (ref 19–32)
Calcium: 8.6 mg/dL (ref 8.4–10.5)
Calcium: 8.7 mg/dL (ref 8.4–10.5)
Chloride: 103 mEq/L (ref 96–112)
Creatinine, Ser: 0.92 mg/dL (ref 0.4–1.5)
GFR calc Af Amer: >60 mL/min (ref >60)
GFR calc Af Amer: >60 mL/min (ref >60)
GFR calc non Af Amer: >60 mL/min (ref >60)
Glucose, Bld: 99 mg/dL (ref 70–99)
Potassium: 3.7 mEq/L (ref 3.5–5.1)
Sodium: 137 mEq/L (ref 135–145)
Sodium: 138 mEq/L (ref 135–145)

## 2011-04-01 LAB — CBC
Hemoglobin: 11.5 g/dL — ABNORMAL LOW (ref 13.0–17.0)
Hemoglobin: 14.1 g/dL (ref 13.0–17.0)
MCHC: 33.3 g/dL (ref 30.0–36.0)
MCHC: 33.3 g/dL (ref 30.0–36.0)
MCHC: 33.9 g/dL (ref 30.0–36.0)
MCV: 89.3 fL (ref 78.0–100.0)
MCV: 91.3 fL (ref 78.0–100.0)
MCV: 90.3 fL (ref 78.0–100.0)
Platelets: 259 10*3/uL (ref 150–400)
Platelets: 290 10*3/uL (ref 150–400)
Platelets: 342 10*3/uL (ref 150–400)
Platelets: 363 10*3/uL (ref 150–400)
RBC: 3.8 MIL/uL — ABNORMAL LOW (ref 4.22–5.81)
RBC: 3.88 MIL/uL — ABNORMAL LOW (ref 4.22–5.81)
RDW: 13.5 % (ref 11.5–15.5)
RDW: 14.4 % (ref 11.5–15.5)
RDW: 14.6 % (ref 11.5–15.5)
RDW: 14.4 % (ref 11.5–15.5)
WBC: 7.8 10*3/uL (ref 4.0–10.5)

## 2011-04-01 LAB — GLUCOSE, CAPILLARY
Glucose-Capillary: 106 mg/dL — ABNORMAL HIGH (ref 70–99)
Glucose-Capillary: 118 mg/dL — ABNORMAL HIGH (ref 70–99)
Glucose-Capillary: 73 mg/dL (ref 70–99)
Glucose-Capillary: 79 mg/dL (ref 70–99)
Glucose-Capillary: 83 mg/dL (ref 70–99)
Glucose-Capillary: 84 mg/dL (ref 70–99)
Glucose-Capillary: 88 mg/dL (ref 70–99)
Glucose-Capillary: 88 mg/dL (ref 70–99)
Glucose-Capillary: 90 mg/dL (ref 70–99)
Glucose-Capillary: 98 mg/dL (ref 70–99)

## 2011-04-01 LAB — COMPREHENSIVE METABOLIC PANEL
ALT: 29 U/L (ref 0–53)
AST: 21 U/L (ref 0–37)
Albumin: 2.5 g/dL — ABNORMAL LOW (ref 3.5–5.2)
Albumin: 2.7 g/dL — ABNORMAL LOW (ref 3.5–5.2)
Alkaline Phosphatase: 100 U/L (ref 39–117)
CO2: 29 mEq/L (ref 19–32)
Calcium: 8.5 mg/dL (ref 8.4–10.5)
Calcium: 9.1 mg/dL (ref 8.4–10.5)
Chloride: 104 mEq/L (ref 96–112)
Creatinine, Ser: 0.98 mg/dL (ref 0.4–1.5)
Creatinine, Ser: 1.02 mg/dL (ref 0.4–1.5)
GFR calc Af Amer: >60 mL/min (ref >60)
GFR calc Af Amer: >60 mL/min (ref >60)
GFR calc non Af Amer: >60 mL/min (ref >60)
Glucose, Bld: 89 mg/dL (ref 70–99)
Glucose, Bld: 94 mg/dL (ref 70–99)
Potassium: 3.7 mEq/L (ref 3.5–5.1)
Sodium: 136 mEq/L (ref 135–145)
Total Bilirubin: 0.4 mg/dL (ref 0.3–1.2)
Total Protein: 6.5 g/dL (ref 6.0–8.3)
Total Protein: 7.3 g/dL (ref 6.0–8.3)

## 2011-04-01 LAB — DIFFERENTIAL
Basophils Absolute: 0 10*3/uL (ref 0.0–0.1)
Basophils Relative: 0 % (ref 0–1)
Eosinophils Absolute: 0.1 10*3/uL (ref 0.0–0.7)
Monocytes Relative: 9 % (ref 3–12)
Neutro Abs: 7.6 10*3/uL (ref 1.7–7.7)
Neutrophils Relative %: 64 % (ref 43–77)

## 2011-04-01 LAB — PROTIME-INR
INR: 1.1 (ref 0.00–1.49)
INR: 1.2 (ref 0.00–1.49)
Prothrombin Time: 15.4 seconds — ABNORMAL HIGH (ref 11.6–15.2)

## 2011-04-01 LAB — URINE CULTURE
Culture: NO GROWTH
Special Requests: NEGATIVE

## 2011-04-01 LAB — CULTURE, BLOOD (ROUTINE X 2)
Culture: NO GROWTH
Culture: NO GROWTH

## 2011-04-01 LAB — CULTURE, ROUTINE-ABSCESS

## 2011-04-01 LAB — LACTATE DEHYDROGENASE: LDH: 175 U/L (ref 94–250)

## 2011-04-01 LAB — APTT: aPTT: 34 seconds (ref 24–37)

## 2011-04-02 LAB — COMPREHENSIVE METABOLIC PANEL
ALT: 22 U/L (ref 0–53)
AST: 22 U/L (ref 0–37)
Alkaline Phosphatase: 104 U/L (ref 39–117)
Alkaline Phosphatase: 89 U/L (ref 39–117)
Alkaline Phosphatase: 82 U/L (ref 39–117)
BUN: 18 mg/dL (ref 6–23)
BUN: 8 mg/dL (ref 6–23)
CO2: 28 mEq/L (ref 19–32)
Calcium: 8.8 mg/dL (ref 8.4–10.5)
Calcium: 9.6 mg/dL (ref 8.4–10.5)
Chloride: 105 mEq/L (ref 96–112)
Chloride: 104 mEq/L (ref 96–112)
Creatinine, Ser: 1.03 mg/dL (ref 0.4–1.5)
Creatinine, Ser: 1.07 mg/dL (ref 0.4–1.5)
GFR calc Af Amer: >60 mL/min (ref >60)
GFR calc non Af Amer: >60 mL/min (ref >60)
Glucose, Bld: 102 mg/dL — ABNORMAL HIGH (ref 70–99)
Glucose, Bld: 121 mg/dL — ABNORMAL HIGH (ref 70–99)
Glucose, Bld: 99 mg/dL (ref 70–99)
Potassium: 4.4 mEq/L (ref 3.5–5.1)
Potassium: 4.4 mEq/L (ref 3.5–5.1)
Potassium: 3.6 mEq/L (ref 3.5–5.1)
Sodium: 138 mEq/L (ref 135–145)
Total Bilirubin: 0.8 mg/dL (ref 0.3–1.2)
Total Protein: 7 g/dL (ref 6.0–8.3)

## 2011-04-02 LAB — GLUCOSE, CAPILLARY
Glucose-Capillary: 108 mg/dL — ABNORMAL HIGH (ref 70–99)
Glucose-Capillary: 96 mg/dL (ref 70–99)
Glucose-Capillary: 105 mg/dL — ABNORMAL HIGH (ref 70–99)
Glucose-Capillary: 122 mg/dL — ABNORMAL HIGH (ref 70–99)
Glucose-Capillary: 97 mg/dL (ref 70–99)

## 2011-04-02 LAB — URINALYSIS, ROUTINE W REFLEX MICROSCOPIC
Glucose, UA: NEGATIVE mg/dL
Glucose, UA: NEGATIVE mg/dL
Hgb urine dipstick: NEGATIVE
Ketones, ur: NEGATIVE mg/dL
Leukocytes, UA: NEGATIVE
Protein, ur: NEGATIVE mg/dL
Protein, ur: NEGATIVE mg/dL
Specific Gravity, Urine: 1.025 (ref 1.005–1.030)
Urobilinogen, UA: 0.2 mg/dL (ref 0.0–1.0)

## 2011-04-02 LAB — CBC
HCT: 45.1 % (ref 39.0–52.0)
HCT: 37 % — ABNORMAL LOW (ref 39.0–52.0)
Hemoglobin: 13.7 g/dL (ref 13.0–17.0)
Hemoglobin: 15.8 g/dL (ref 13.0–17.0)
Hemoglobin: 12.3 g/dL — ABNORMAL LOW (ref 13.0–17.0)
Hemoglobin: 12.8 g/dL — ABNORMAL LOW (ref 13.0–17.0)
MCHC: 35 g/dL (ref 30.0–36.0)
MCHC: 35.3 g/dL (ref 30.0–36.0)
MCHC: 35.3 g/dL (ref 30.0–36.0)
MCHC: 33.4 g/dL (ref 30.0–36.0)
MCHC: 34 g/dL (ref 30.0–36.0)
MCV: 90.2 fL (ref 78.0–100.0)
MCV: 90.4 fL (ref 78.0–100.0)
MCV: 90.2 fL (ref 78.0–100.0)
Platelets: 151 10*3/uL (ref 150–400)
Platelets: 170 10*3/uL (ref 150–400)
Platelets: 101 10*3/uL — ABNORMAL LOW (ref 150–400)
Platelets: 99 10*3/uL — ABNORMAL LOW (ref 150–400)
RBC: 4.32 MIL/uL (ref 4.22–5.81)
RBC: 4.34 MIL/uL (ref 4.22–5.81)
RBC: 3.98 MIL/uL — ABNORMAL LOW (ref 4.22–5.81)
RBC: 4.07 MIL/uL — ABNORMAL LOW (ref 4.22–5.81)
RDW: 14.3 % (ref 11.5–15.5)
RDW: 13.8 % (ref 11.5–15.5)
RDW: 13.8 % (ref 11.5–15.5)
RDW: 14 % (ref 11.5–15.5)
RDW: 14.2 % (ref 11.5–15.5)
WBC: 7.3 10*3/uL (ref 4.0–10.5)
WBC: 5.3 10*3/uL (ref 4.0–10.5)
WBC: 6.7 10*3/uL (ref 4.0–10.5)

## 2011-04-02 LAB — DIFFERENTIAL
Basophils Absolute: 0 10*3/uL (ref 0.0–0.1)
Basophils Absolute: 0 10*3/uL (ref 0.0–0.1)
Basophils Relative: 0 % (ref 0–1)
Basophils Relative: 1 % (ref 0–1)
Basophils Relative: 1 % (ref 0–1)
Basophils Relative: 1 % (ref 0–1)
Eosinophils Absolute: 0.1 10*3/uL (ref 0.0–0.7)
Eosinophils Absolute: 0.1 10*3/uL (ref 0.0–0.7)
Eosinophils Relative: 2 % (ref 0–5)
Lymphocytes Relative: 19 % (ref 12–46)
Lymphocytes Relative: 36 % (ref 12–46)
Lymphs Abs: 1.9 10*3/uL (ref 0.7–4.0)
Monocytes Absolute: 0.5 10*3/uL (ref 0.1–1.0)
Monocytes Relative: 5 % (ref 3–12)
Monocytes Relative: 9 % (ref 3–12)
Monocytes Relative: 10 % (ref 3–12)
Neutro Abs: 6.9 10*3/uL (ref 1.7–7.7)
Neutro Abs: 7 10*3/uL (ref 1.7–7.7)
Neutro Abs: 2.5 10*3/uL (ref 1.7–7.7)
Neutrophils Relative %: 60 % (ref 43–77)
Neutrophils Relative %: 62 % (ref 43–77)
Neutrophils Relative %: 75 % (ref 43–77)
Neutrophils Relative %: 50 % (ref 43–77)

## 2011-04-02 LAB — BASIC METABOLIC PANEL
BUN: 13 mg/dL (ref 6–23)
CO2: 30 mEq/L (ref 19–32)
Calcium: 8.8 mg/dL (ref 8.4–10.5)
Chloride: 106 mEq/L (ref 96–112)
Creatinine, Ser: 1.1 mg/dL (ref 0.4–1.5)
GFR calc Af Amer: >60 mL/min (ref >60)

## 2011-04-02 LAB — POCT CARDIAC MARKERS
CKMB, poc: 2.2 ng/mL (ref 1.0–8.0)
Myoglobin, poc: 202 ng/mL (ref 12–200)

## 2011-04-02 LAB — CANCER ANTIGEN 19-9: CA 19-9: 8.6 U/mL — ABNORMAL LOW (ref <35.0)

## 2011-04-02 LAB — LIPASE, BLOOD: Lipase: 21 U/L (ref 11–59)

## 2011-04-02 LAB — C-REACTIVE PROTEIN: CRP: 0.3 mg/dL — ABNORMAL LOW (ref <0.6)

## 2011-04-02 LAB — LIPID PANEL
HDL: 26 mg/dL — ABNORMAL LOW (ref >39)
LDL Cholesterol: 148 mg/dL — ABNORMAL HIGH (ref 0–99)
Triglycerides: 151 mg/dL — ABNORMAL HIGH (ref <150)
VLDL: 30 mg/dL (ref 0–40)

## 2011-04-02 LAB — URINE MICROSCOPIC-ADD ON

## 2011-04-02 LAB — CEA: CEA: 2 ng/mL (ref 0.0–5.0)

## 2011-05-05 ENCOUNTER — Encounter (HOSPITAL_BASED_OUTPATIENT_CLINIC_OR_DEPARTMENT_OTHER): Payer: Medicare Other | Admitting: Oncology

## 2011-05-05 ENCOUNTER — Ambulatory Visit (HOSPITAL_COMMUNITY)
Admission: RE | Admit: 2011-05-05 | Discharge: 2011-05-05 | Disposition: A | Discharge status: Home / Self Care | Payer: Medicare Other | Source: Ambulatory Visit | Attending: Oncology | Admitting: Oncology

## 2011-05-05 ENCOUNTER — Encounter (HOSPITAL_COMMUNITY): Payer: Self-pay

## 2011-05-05 ENCOUNTER — Other Ambulatory Visit (HOSPITAL_COMMUNITY): Payer: Self-pay | Admitting: Oncology

## 2011-05-05 ENCOUNTER — Encounter (HOSPITAL_COMMUNITY)
Admission: RE | Admit: 2011-05-05 | Discharge: 2011-05-05 | Disposition: A | Discharge status: Home / Self Care | Payer: Medicare Other | Source: Ambulatory Visit | Attending: Oncology | Admitting: Oncology

## 2011-05-05 DIAGNOSIS — C8590 Non-Hodgkin lymphoma, unspecified, unspecified site: Secondary | ICD-10-CM

## 2011-05-05 DIAGNOSIS — C8589 Other specified types of non-Hodgkin lymphoma, extranodal and solid organ sites: Secondary | ICD-10-CM | POA: Insufficient documentation

## 2011-05-05 DIAGNOSIS — I7090 Unspecified atherosclerosis: Secondary | ICD-10-CM | POA: Insufficient documentation

## 2011-05-05 LAB — CBC WITH DIFFERENTIAL/PLATELET
BASO%: 0.4 % (ref 0.0–2.0)
EOS%: 2.1 % (ref 0.0–7.0)
HCT: 43.3 % (ref 38.4–49.9)
LYMPH%: 26 % (ref 14.0–49.0)
MCH: 31.7 pg (ref 27.2–33.4)
MCHC: 33.8 g/dL (ref 32.0–36.0)
MONO#: 0.6 10*3/uL (ref 0.1–0.9)
NEUT%: 63.3 % (ref 39.0–75.0)
Platelets: 156 10*3/uL (ref 140–400)

## 2011-05-05 LAB — GLUCOSE, CAPILLARY: Glucose-Capillary: 97 mg/dL (ref 70–99)

## 2011-05-05 LAB — CMP (CANCER CENTER ONLY)
ALT(SGPT): 29 U/L (ref 10–47)
CO2: 26 mEq/L (ref 18–33)
Creat: 0.9 mg/dl (ref 0.6–1.2)
Total Bilirubin: 0.5 mg/dl (ref 0.20–1.60)

## 2011-05-05 MED ORDER — FLUDEOXYGLUCOSE F - 18 (FDG) INJECTION: 17.5000 | Freq: Once | INTRAVENOUS | Status: AC | PRN

## 2011-05-09 ENCOUNTER — Encounter (HOSPITAL_BASED_OUTPATIENT_CLINIC_OR_DEPARTMENT_OTHER): Payer: Medicare Other | Admitting: Oncology

## 2011-05-09 ENCOUNTER — Ambulatory Visit (HOSPITAL_COMMUNITY)
Admission: RE | Admit: 2011-05-09 | Discharge: 2011-05-09 | Disposition: A | Discharge status: Home / Self Care | Payer: Medicare Other | Source: Ambulatory Visit | Attending: Oncology | Admitting: Oncology

## 2011-05-09 ENCOUNTER — Encounter (HOSPITAL_COMMUNITY): Payer: Self-pay

## 2011-05-09 DIAGNOSIS — C8589 Other specified types of non-Hodgkin lymphoma, extranodal and solid organ sites: Secondary | ICD-10-CM | POA: Insufficient documentation

## 2011-05-09 DIAGNOSIS — R109 Unspecified abdominal pain: Secondary | ICD-10-CM | POA: Insufficient documentation

## 2011-05-09 DIAGNOSIS — M87059 Idiopathic aseptic necrosis of unspecified femur: Secondary | ICD-10-CM | POA: Insufficient documentation

## 2011-05-09 MED ORDER — IOHEXOL 300 MG/ML  SOLN
125.0000 mL | Freq: Once | INTRAMUSCULAR | Status: AC | PRN
  Administered 2011-05-09: 125 mL via INTRAVENOUS

## 2011-05-09 NOTE — Consult Note (Signed)
[name], [name]                  ACCOUNT NO.:  [id number]   MEDICAL RECORD NO.:  [id number]          PATIENT TYPE:  INP   LOCATION:  1337                         FACILITY:  [facility]   PHYSICIAN:  [name], M.D.DATE OF BIRTH:  Jul 17, 1946   DATE OF CONSULTATION:  07/23/2009  DATE OF DISCHARGE:                                 CONSULTATION   REQUESTING PHYSICIAN:  Dr. Rob Sharp.   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   PATIENT IDENTIFICATION/HISTORY OF PRESENT ILLNESS:  [name] is a  65 year old white male admitted to [facility] on July 17, 2009  for evaluation and management of a 24-hour history of abdominal pain  with nausea and vomiting as well as increased pain with food.  His  workup included a CT scan of the abdomen and pelvis without contrast  which revealed an ill-defined soft tissue involving the distal pancreas  and splenic hilar region, no acute pelvic findings.  Followup CT of the  abdomen with contrast on July 17, 2009 revealed a 3.8 x 4.1 cm mass  immediately superior to the pancreatic tail.  There are lymph nodes  surrounding pancreatic tail.  The mass appears to be surrounding the  splenic artery and vein.  A 4 cm hypodense mass mid pole of the spleen  which may be due to metastatic disease from direct invasion.  Followup  MRI of the abdomen with and without contrast on July 20, 2009 revealed a  3.5 x 3 cm focal mass extending from the superior aspect of the  pancreatic tail to the splenic hilum. Two focal splenic lesions suspect  for metastatic disease.  Small nodule dorsal to pancreatic tail mass,  likely lymph node.  Small central hepatic cyst, small bilateral pleural  effusions and sigmoid diverticulosis.  Bilateral femoral head avascular  necrosis.  The patient went on to receive upper EUS with fine-needle  aspirate on July 22, 2009, this showed a 3.8 x 3.3 cm pancreatic tail  mass and  adenopathy, malignant appearing.  Fine-needle aspirate from the  mass and adenopathy was positive for marked atypical cells and was felt  to strongly suggest large lymphoid cells. Additional material was  however strongly recommnded.  Dr. Delila Sharp felt that this could be islet  cell  and he is obtaining a second opinion at [facility].   PAST MEDICAL HISTORY/SURGICAL HISTORY:  1. COPD.  2. Depression.  3. Glaucoma.  4. Obstructive sleep apnea.  5. Gastroesophageal reflux disease.  6. Obesity.  7. Osteoarthritis.  8. History of herniated disk with chronic back pain.  9. Type 2 diabetes mellitus.   MEDICATIONS ON ADMISSION:  1. Aleve 600 mg p.o. three times daily as needed for pain.  2. Aspirin 81 mg p.o. daily.  3. Lidoderm patch to back 12 hours on 12 hours off.  4. Travatan eye drops 0.004% one drop to each eye daily.  5. Metformin 500 mg p.o. twice daily.   ALLERGIES:  CODEINE.   REVIEW OF SYSTEMS:  The patient is currently pain  free.  He is not  experiencing any dyspnea, nausea, vomiting, constipation or diarrhea.  He has good appetite.  He does report some intermittent back, hip and  abdominal pain as well as intermittent numbness and tingling in his  wrist at times.   FAMILY HISTORY:  Positive history of colon cancer in a sister and an  uncle, also positive history of malignancy in his father who died of  leukemia.  No other history of malignancies.  The patient has one  daughter, age 77, alive and well.   SOCIAL HISTORY:  The patient is divorced.  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  He has a history of  heavy cigarette smoking for approximately 45 years.  He then quit for a  short time and resumed smoking 6 months ago.  He states he has not had a  cigarette since his hospital admission.  No history of EtOH or illicit  drug use.   PHYSICAL EXAM:  Temperature is 98.5, heart rate 53, respirations 20,  blood  pressure 113/59, O2 saturation 99% on 2 liters.  GENERAL:  This is a well-developed, well-nourished white male in no  acute distress.  HEENT:  Normocephalic, sclerae nonicteric, there is no oral thrush or  mucositis.  SKIN:  Without rashes or lesions.  LYMPH:  No cervical, supraclavicular, axillary or inguinal  lymphadenopathy.  CARDIAC:  Regular rate and rhythm without murmurs or gallops.  Peripheral pulses are 2+.  CHEST:  Lungs clear to auscultation bilaterally.  ABDOMEN:  Positive bowel sounds, soft with generalized upper quadrant  tenderness, no organomegaly.  EXTREMITIES:  No edema or cyanosis.  NEURO:  Alert and oriented x3.  Strength, sensation, coordination are  grossly intact.   LABS:  CBC with diff reveals white blood count of 5, hemoglobin 12.3,  hematocrit 36.2, platelets of 105, ANC of 2.5 and MCV of 91.  Laboratory  data from July 21, 2009:  PSA of 0.51, CEA of 2.  Laboratory data from  July 20, 2009:  CA 19-9 of 8.6, lipase of 21.  Chemistries reveal a  sodium of 137, potassium 3.6, chloride of 104, BUN of 8, creatinine 1.07  and glucose of 99, calcium of 8.2, bilirubin 0.8, alkaline phosphatase  82, AST 18, ALT 20, total protein 5.8 and albumin of 3.   IMPRESSION/PLAN:  1. 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  Dr. Johna Sharp plans to      follow up with patient as an out patient for a surgical opinion.      He follows up with Oncology thereafter.  2. Osteoarthritis with chronic back pain and bilateral femoral head      avascular necrosis.  3. Diabetes mellitus type 2 on oral medication.  4. Glaucoma on eye drops.  5.  Thrombocytopenia, no bleeding or bruising.  6. Anemia, asymptomatic.  7. Deep venous thrombosis prophylaxis, sequential compression devices.  8. Obstructive sleep apnea.  9. Gastroesophageal reflux disease.   Patient is evaluated by Dr. Arlan Organ and the plan  of care is  formulated by Dr. Arlan Organ.   Thank you for allowing [place] to participate in the care of this patient.      Sherilyn Banker, NP      [name], M.D.  Electronically Signed    RJ/MEDQ  D:  07/23/2009  T:  07/23/2009  Job:  [id number]

## 2011-05-09 NOTE — Group Therapy Note (Signed)
NAME:  [name], [name]                  ACCOUNT NO.:  [id number]   MEDICAL RECORD NO.:  [id number]          PATIENT TYPE:  INP   LOCATION:  A336                          FACILITY:  [facility]   PHYSICIAN:  Melissa L. Ladona Ridgel, MD  DATE OF BIRTH:  June 30, 1946   DATE OF PROCEDURE:  DATE OF DISCHARGE:  07/19/2009                                 PROGRESS NOTE   Please note, this patient was seen in conjunction with physician's  assistant.  Please see our written documentation in conjunction with  this dictation.  Subjectively, the patient is feeling okay.  He did  tolerate a little bit of clear liquids this morning but is not advancing  his diet as quickly as he would like. Still, he maintains some nausea  but no vomiting.  The patient underwent CT scan on admission and was  noted to have a mass that is involving the tail of the pancreas.  It  extends to the level of the splenic hilum and wraps around the splenic  artery.  There is also a 4 cm mass in the spleen. All the findings are  consistent and worrisome for possible malignancy.  In light of the fact  that the patient is unable to have a CT scan here at [facility]  secondary to his current weight, we have elected to transfer the patient  to [facility] Hospital where he can have his MRI and can be evaluated  by [facility] who may elect to undertake an esophageal ultrasound if his  MRI warrants that.   PHYSICAL EXAMINATION:  VITAL SIGNS:  Clinically, I have reviewed his  vital signs.  He is afebrile.  Blood pressure stable at 128/86, pulse  53, respirations 18, saturation 98%.  GENERAL:  This is a robust, moderately obese white male in no acute  distress.  HEENT:  He is normocephalic, atraumatic.  Pupils equal, round, reactive  to light.  Extraocular muscles are intact.  His mucous membranes are  moist.  NECK:  Supple.  I did not appreciate any JVD.  CHEST:  Clear to auscultation.  There is no rhonchi, rales or wheezes.  CARDIOVASCULAR:   Regular rate and rhythm.  Positive S1,S2.  No S3, S4.  No murmurs, rubs or gallops.  ABDOMEN:  Soft, nontender.  Note that he is slightly tender in the mid  epigastric area.  I do not appreciate any guarding or rebound.  EXTREMITIES:  Show no clubbing, cyanosis or edema.  NEUROLOGIC:  He is awake, alert, oriented.  Cranial nerves II-XII are  intact.   LABORATORY DATA:  I have reviewed his laboratories and note no really  significant abnormalities.  Thyroid studies are within normal limits.  Hemoglobin A1c is 5.8.  CBC is within normal limits today with white  count of 7.3, hemoglobin 13.7, hematocrit 38.8, platelets of 105.  His  creatinine is currently 1.06.   ASSESSMENT/PLAN:  This is a 65 year old white male who presented to the  hospital with nausea, vomiting, abdominal pain, found to have pancreatic  mass that extends to the spleen with metastatic lesions  to the spleen.  The patient really is not tolerating them much p.o. intake.  Therefore,  feel it is important to transfer him to [facility] for further  evaluation of this mass and to establish his nutritional status so that  he is able to fight further adversity as the days go on.  The patient  likely can be discharged in the morning after a plan is put together for  possible outpatient evaluation with other studies, but at this time I  feel it is necessary for him to continue to be in the hospital since he  is not eating solid foods.   Total time on this case was approximately 40 minutes.  Please see the  accompanying written note by my physician's assistant.      Melissa L. Ladona Ridgel, MD  Electronically Signed     MLT/MEDQ  D:  07/19/2009  T:  07/19/2009  Job:  [id number]

## 2011-05-09 NOTE — Discharge Summary (Signed)
NAMEJODECI, [name]                  ACCOUNT NO.:  [id number]   MEDICAL RECORD NO.:  [id number]          PATIENT TYPE:  INP   LOCATION:  1337                         FACILITY:  [facility]   PHYSICIAN:  Marcellus Scott, MD     DATE OF BIRTH:  January 08, 1946   DATE OF ADMISSION:  07/19/2009  DATE OF DISCHARGE:  07/23/2009                               DISCHARGE SUMMARY   PRIMARY MEDICAL DOCTOR:  Dr. Kari Baars   DISCHARGE DIAGNOSES:  1. 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  2. Abdominal pain secondary to problem number 1.  3. Thrombocytopenia, improving.  4. Type 2 diabetes mellitus.  5. Obstructive sleep apnea syndrome on nightly continuous positive      airway pressure and as-needed home oxygen.  6. Mild anemia.  7. Gastroesophageal reflux disease.  8. Tobacco abuse.  9. Chronic obstructive pulmonary disease.  10.History of osteoarthritis, herniated disks and chronic back pain.  11.Glaucoma.   DISCHARGE MEDICATIONS:  1. Lidoderm 5% patch to back 12 hours onn and 12 hours off, as needed.  2. Travatan 0.004% eye drops, 1 drop in each eye at bedtime.  3. Metformin 500 mg p.o. b.i.d.  4. Nicotine 7 mg per 24-hour patch daily for 5 weeks then discontinue.      Two-week supply dispensed.  5. CPAP at bedtime.  6. Oxygen via nasal cannula at 2 liters per minute p.r.n.  7. Morphine sulfate immediate release (MSIR) 15 mg p.o. q.6 hourly      p.r.n. Twenty tablets dispensed.   DISCONTINUED MEDICATIONS:  1. Aleve  2. Aspirin.   PROCEDURES:  1. Endoscopic ultrasound with biopsy by Dr. Rob Bunting on July 22, 2009.  Impression:  A 3.8 cm x 3.3 cm tail of pancreas mass nearby      malignant appearing adenopathy.  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  2. CT of the abdomen with contrast.  Impression:  A 3.8 cm x 4.1 cm      mass immediately superior to the  pancreatic tail.  There are lymph      nodes surrounding the pancreatic tail.  The mass appears to be      surrounding splenic artery and vein.  There is a 4 cm hypodense      mass in the mid pole of the spleen which may be due to metastatic      disease from direct invasion.  MRI using pancreatic protocol was      recommended.  3. CT of the abdomen on July 24th.  Impression:  Ill-defined soft      tissue involving the distal pancreas and splenic hilar region.      This finding was highly concerning for a neoplastic process arising      from the pancreas.  There may be adjacent lymphadenopathy.      Recommended abdominal CT using a pancreatic protocol to better  characterize this abnormality.  This was done as per procedure #1.      A 3-mm stone in the right kidney without hydronephrosis.      Diverticulosis without acute inflammation.  4. CT of the pelvis.  Impression:  No acute pelvic findings.  There is      sclerosis involving the femoral head concerning for avascular      necrosis or osteonecrosis.  5. Chest x-ray on July 24th.  Impression:  No acute chest findings.   PERTINENT LABS:  CBCs today with hemoglobin 12.3, hematocrit 36.2, white  blood cell 5, platelets 105.  PSA 0.51, carcinoembryonic antigen 2, CA-  19-9 elevated at 8.6, lipase 21.  Comprehensive metabolic panel  unremarkable except for total protein 5.8, albumin 3, hemoglobin A1c  5.8, C-reactive protein 0.3, free T4 1.08, TSH 3.480.  Lipid panel:  Cholesterol 204, triglycerides 151, HDL 26, LDL 148, VLDL 30.  However,  the sample was not a fasting sample as it was drawn at 6:44 p.m.  Urinalysis without features of urinary tract infection.  ESR 2.  Urinalysis not keeping with urinary tract infection.  Point of care  cardiac markers not suggestive of acute MI.   CONSULTATIONS:  1. GI, Dr. Rob Bunting.  2. Surgery, Dr. Glenna Fellows.  3. Oncology, Dr. Vicente Serene Odogwu.   HOSPITAL COURSE AND PATIENT  DISPOSITION:  Please refer to the history  and physical note for initial admission details.  In summary, [name]  is a pleasant 65 year old Caucasian male patient with a history of COPD,  depression, glaucoma, obstructive sleep apnea on nightly CPAP,  gastroesophageal reflux disease, obesity, type 2 diabetes mellitus who  presented with a history of abdominal pain and nausea, vomiting 24 hours  prior to admission which increased with food.  He denied any weight loss  or episodes of jaundice.  He was thereby admitted for further evaluation  and management.  He has been off tobacco for approximately 4-5 years.  Further evaluation revealed CT abdomen and pelvis with a pancreatic  mass.  He was placed on morphine PCA.  GI was consulted.  They proceeded  to do MRI of the abdomen which was done outside the [facility].  The impression was an approximately 3.5 cm by 3 cm size focal  mass extending from the superior aspect of the pancreatic tail to the  splenic hila.  Resolution of this study was not adequate to assess  possible splenic vascular encasement or splenic parenchymal invasion.  No focal splenic lesions are suspected for metastatic disease.  Small  nodule dorsal to the pancreatic tail most likely lymph node.  Small  central hepatic cyst.  Small bilateral pleural effusions.  Sigmoid  diverticulosis.  Bilateral femoral head avascular necrosis.  GI proceeded to do endoscopic ultrasound and biopsy which was suggestive  of islet cell tumor.  They recommended surgical and oncology evaluation.  Dr. Dalene Carrow saw the patient and I did discuss with her.  She indicates  that if it is an islet cell tumor, the tumors are generally chemo  resistant.  She indicates disease appears localized and recommended a  surgical opinion.  I spoke with Dr. Glenna Fellows who indicated that  the patient could be discharged from his standpoint and his offices will  call for an appointment for the  patient to be followed in their offices.  At this time patient complains of sense of abdominal bloating, but  minimal pain.  However, he requests a small  supply of medication if his  abdominal pain gets worse.  He has been advised to seek immediate  medical attention if there is worsening pain.  Both Dr. Johna Sheriff and Dr.  Lonell Face offices will get in touch with [name] for appointments to  follow with them.   Patient had thrombocytopenia to a low of 99.  This had dropped from 170  on admission.  He had been on Lovenox.  There was no bleeding.  A HIT  panel was sent off and is pending.  Recommend repeating his CBCs in a  week's time.  If his HIT is positive then recommend not using heparin  products in the future.  At this time, the patient is stable for  discharge.      Marcellus Scott, MD  Electronically Signed     AH/MEDQ  D:  07/23/2009  T:  07/23/2009  Job:  [id number]   cc:   Ramon Dredge L. [name], M.D.  Fax: [phone]   Lauretta I. Odogwu, M.D.  Fax: [phone]   Lorne Skeens. Hoxworth, M.D.  [address], [address]  [place]  [place] [address]   Rachael Fee, MD  [address]  [place], [place] [address]

## 2011-05-09 NOTE — Discharge Summary (Signed)
[name], [name]                  ACCOUNT NO.:  [id number]   MEDICAL RECORD NO.:  [id number]          PATIENT TYPE:  INP   LOCATION:  1332                         FACILITY:  [facility]   PHYSICIAN:  Samul Dada, M.D.DATE OF BIRTH:  09/05/1946   DATE OF ADMISSION:  08/13/2009  DATE OF DISCHARGE:  08/19/2009                               DISCHARGE SUMMARY   PRIMARY MEDICAL PHYSICIAN:  Oneal Deputy. [name], M.D.   DISCHARGE DIAGNOSES:  1. 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  2. Large 10.9 x 6.5 x 8.6 cm splenic abscess.  3. Abdominal pain due to pancreatic tail mass and splenic abscess.  4. Diabetes mellitus type 2.  5. Glaucoma.  6. Obstructive sleep apnea on continuous positive airway pressure at      night, as well as oxygen via nasal cannula as needed at home.  7. Gastroesophageal reflux disease.  8. Chronic obstructive pulmonary disease.  9. History of tobacco abuse.  10.History of osteoarthritis, herniated disk and chronic back pain.   DISCHARGE MEDICATIONS:  1. Lidoderm 5% patch to back, 12 hours on followed by 12 hours off as      needed.  2. Travatan 0.004% eye drops, 1 drop to each eye at bedtime.  3. Metformin 500 mg p.o. b.i.d.  4. Continuous positive airway pressure at bedtime.  5. Oxygen via nasal cannula at 2 liters p.r.n.  6. Morphine sulfate immediate release 15 mg every 6 hours p.r.n. pain.  7. Augmentin 875 mg p.o. b.i.d. 3 weeks.   PROCEDURES:  1. Interventional radiology to place a large caliber drainage catheter      in the splenic abscess on August 13, 2009.  2. CT of the abdomen and pelvis with and without contrast on August 18, 2009 showed interval decrease in the size of the splenic      abscess.  Comparison was made to the CT scan of the abdomen and      pelvis on July 17, 2009.  On July 17, 2009, the abscess was 13.8 x      12.6 x 8.6 cm and now measured 10.9 x 6.5 x 8.6 cm was a stable      pancreatic tail mass and  splenic mass.   VITAL SIGNS DAY OF DISCHARGE:  Temperature 98.2, pulse 65, respiration  20, blood pressure 117/71, pulse ox 96% on room air.   PERTINENT LABORATORIES:  1. Cultures of the abscess drainage grew abundant WBCs present      predominantly PMN.  Rare squamous epithelial cells present.      Moderate gram-positive cocci in pairs, clusters and chains, rare      gram positive rods, moderate diphtheroids, Corynebacterium species.  2. Negative urine culture from August 13, 2009.  No growth.  3. CBC on August 17, 2009.  WBC of 7.8, hemoglobin 11.5, platelets      263,000.  4. BMET on August 17, 2009.  Sodium 137, potassium 3.5, chloride 103,      CO2 of 30, glucose 106, BUN 15, creatinine 0.92.  5. Blood  cultures x2 from August __________, 2010 - no growth.   CONDITION OF PATIENT AT DISCHARGE:  Stable.   HOSPITAL COURSE:  See history and physical for admission details.  Briefly, [name] was admitted for a biopsy of the pancreatic mass which  was unable to be completed due to the large splenic abscess.  A large  bore catheter to drain the splenic abscess was placed.  The patient did  have fevers and was on Unasyn antibiotic for the abscess during his  admission.  He was discharged on Augmentin.   FOLLOW UP:  1. On August 20, 2009, the patient will return to the hospital to have      the catheter draining the splenic abscess replaced, as it had begun      to slow down in its drainage.  2. The patient will follow up with Dr. Arline Asp in the office next      week, on either August 23, 2009 or August 24, 2009.  3. Biopsy of the pancreatic tail mass will be rescheduled once the      splenic abscess is resolved.      Bobbe Medico, PA-C      Samul Dada, M.D.  Electronically Signed    SJ/MEDQ  D:  08/20/2009  T:  08/20/2009  Job:  [id number]

## 2011-05-09 NOTE — Group Therapy Note (Signed)
NAME:  [name], [name]                  ACCOUNT NO.:  [id number]   MEDICAL RECORD NO.:  [id number]          PATIENT TYPE:  INP   LOCATION:  A336                          FACILITY:  [facility]   PHYSICIAN:  Melissa L. Ladona Ridgel, MD  DATE OF BIRTH:  May 26, 1946   DATE OF PROCEDURE:  DATE OF DISCHARGE:  07/19/2009                                 PROGRESS NOTE   ADDENDUM   He has no family history of pancreatic cancer.  CT done at admission  with contrast showed a 4-cm pancreatic lesion in the tail of the  pancreas, also showed an approximately 4-cm splenic lesion.  There was  no acute pancreatitis noted on CT.  The patient does have his  gallbladder.   LABORATORY DATA:  White blood cell count 7.3, hemoglobin 13.7,  hematocrit 38.8, and platelets 105.  Sodium 138, potassium 4.4, BUN 10,  and creatinine 1.06.  LFTs are completely within normal limits.  Serum  albumin is 3.2.   OBJECTIVE:  VITAL SIGNS:  Temperature is 98.6, pulse 53, respirations  18, and blood pressure is 125/56.  GENERAL:  This is a 65 year old, relatively obese gentleman who is a  Caucasian.  He is calm, appropriate, and in no acute distress.  HEAD:  Normocephalic and atraumatic.  EYES:  PERRL and anicteric.  NOSE:  No external lesions or discharge.  MOUTH:  No external lesions.  Mucous membranes are moist.  NECK:  No lymphadenopathy or JVD.  Trachea is midline.  Neck is supple.  RESPIRATORY:  He has no accessory muscle use.  Breath sounds are  decreased on the left, but there are no obvious wheezes or crackles.  HEART:  Decreased heart sounds but no obvious murmurs, rubs, or gallops.  Normal S1 and S2.  No S3 or S4.  ABDOMEN:  Obese.  The patient jumps when I palpate his right lower  quadrant.  He also complains of pain diffusely throughout his abdomen.  His abdomen is minimally distended and soft.  He has decreased bowel  sounds.  No tympany.  No organomegaly.  Distal extremities show no  edema.  NEUROLOGIC:  Cranial nerves  II through XII are grossly intact.  His  judgment is appropriate.  He has no dizziness or headaches.  PSYCHIATRIC:  His mood and affect are normal.  Recent and remote memory  are intact.   ASSESSMENT:  A 65 year old gentleman with long-term smoking history,  multiple medical comorbidities, and heavy NSAID use.  Admitted, CT scan  shows a mass in his pancreatic tail as well as in his spleen.  Lipase is  normal, but he appears to have clinical pancreatitis.  Other medical  comorbidities are stable.   PLAN:  With regards to the pancreatitis and mass in his pancreas, we  will check a CA 19-9, start him on non-fat clear liquid diet, transfer  him to [facility] for MRI and for the diagnostic workup of his lesions  with regards to the rest of his comorbidities.  1. COPD is stable.  2. Depression stable, not on medication.  3. Glaucoma, on Travatan eye  drops.  4. Type 2 diabetes.  Outpatient, he was on metformin.  Inpatient, it      has been diet controlled with the serum glucose remaining about 100-      110.  5. Chronic back pain.  He is on Aleve as an outpatient for this.      Currently, on a morphine drip for his pancreatic pain as well as      his chronic back pain.  6. Tobacco abuse.  Encouraged quitting.  The patient has been on      Chantix.  He is also on nicotine patch.  We will transfer him to      [facility] to be accepted by [facility] for further      diagnostic workup of his pancreatic and splenic lesions.      Stephani Police, PA      Melissa L. Ladona Ridgel, MD  Electronically Signed    MLY/MEDQ  D:  07/19/2009  T:  07/20/2009  Job:  [id number]

## 2011-05-09 NOTE — H&P (Signed)
[name], [name]                  ACCOUNT NO.:  [id number]   MEDICAL RECORD NO.:  [id number]          Sharp TYPE:  INP   LOCATION:  1332                         [facility]ACILITY:  [facility]   PHYSICIAN:  Samul Dada, M.D.DATE OF BIRTH:  September 06, 1946   DATE OF ADMISSION:  08/13/2009  DATE OF DISCHARGE:                              HISTORY & PHYSICAL   REASON FOR ADMISSION:  Large splenic abscess.   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  [name] had a fine  needle aspiration on 07/29, suspicious for non-Hodgkin's large cell  lymphoma.  He was to have today a formal biopsy of Ryan pancreatic tail,  however, during Ryan procedure, a large abscess was found, which placed  Ryan biopsy on hold.  Of note, [name] had had fevers and chills over  Ryan last week, and as an outpatient given antibiotics without  significant improvement.   Today, [name] is symptomatic, but Ryan abscess is worrisome for  infectious process, due to odorous quality, and is draining to an  external valve.  To this point, 125 mL of bloody fluid has been drained.  We will admit [name], for 23-hour observation.   PAST MEDICAL HISTORY:  1. Rule out lymphoma.  2. Osteoarthritis.  3. Diabetes mellitus type 2.  4. Glaucoma.  5. History of thrombocytopenia.  6. Obstructive sleep apnea, on CPAP.   ALLERGIES:  1. CODEINE.  2. PREDNISOLONE, WHICH CAUSES BLURRED VISION.   REVIEW OF SYSTEMS:  See HPI for significant positives.  [name]  denies any significant respiratory or cardiac symptoms.  Ryan pain is  more concentrated in his back and in his left upper quadrant, relieved  with Ryan current medication regimen.  No mental status changes.  No  urinary complaints.  No blood in Ryan stools.  Rest of Ryan review of  systems is negative.   SOCIAL HISTORY:  Ryan  Sharp is divorced, a retired [employer].  No  alcohol history.  He smokes one pack a day of cigarettes for at least 45  years.   PHYSICAL EXAMINATION:  GENERAL:  On physical exam, this is a well-  developed, well-nourished 65 year old white male in no acute distress,  alert and oriented x3.  VITAL SIGNS:  Vital signs are pending.  HEENT: Normocephalic, atraumatic.  PERRLA.  Oral cavity without thrush  or lesions.  NECK: Supple.  No cervical or supraclavicular masses.  LUNGS: Essentially clear to auscultation anteriorly.  CARDIOVASCULAR: Regular rate and rhythm without murmurs, rubs or  gallops.  ABDOMEN: Distended, tender in Ryan left upper quadrant, there is a large  abscess drain on his left upper quadrant area.  Bowel sounds x4.  EXTREMITIES: Without clubbing or cyanosis.  No edema.  No inguinal  masses seen at Ryan area of drain.  There is mild erythema, but no  tenderness to light palpation.  No petechial rash.  GU/RECTAL:  Deferred.  MUSCULOSKELETAL: No apparent spinal tenderness.  NEUROLOGIC:  Nonfocal.   LABORATORY DATA:  Labs at this point are pending, as they have been  recently drawn.   ASSESSMENT/PLAN:  Mr. Mcmillon is a pleasant 65 year old white male, with a  new diagnosis of a large pancreatic tail mass, for which he was to  undergo a biopsy, but found to have a large splenic abscess instead, for  which Ryan biopsy has been placed on hold at this time.  [name]  needs to be admitted for further evaluation, for a 23-hour observation.   In Ryan meantime, we will gently hydrate, draw labs including CBC with  differential, CMP, LDH, CEA, uric acid and panculture him.  Will provide  pain medications, including MS Contin and morphine as needed.  In  addition, [name] will be placed on Unasyn 3 grams IV q.6 h after  drawing his cultures.  [name] will be closely monitored, will await  Ryan results of Ryan sample of Ryan drain that has been sent to cytology,  and  further recommendations are to proceed.      [name], P.A.      Samul Dada, M.D.  Electronically Signed    SW/MEDQ  D:  08/19/2009  T:  08/19/2009  Job:  [id number]

## 2011-05-09 NOTE — H&P (Signed)
NAMECLAUDIUS, [name]                  ACCOUNT NO.:  [id number]   MEDICAL RECORD NO.:  [id number]          PATIENT TYPE:  INP   LOCATION:  A336                          FACILITY:  [facility]   PHYSICIAN:  Renee Ramus, MD       DATE OF BIRTH:  [birth year]   DATE OF ADMISSION:  07/17/2009  DATE OF DISCHARGE:  LH                              HISTORY & PHYSICAL   PRIMARY CARE PHYSICIAN:  [name], M.D.   HISTORY OF PRESENT ILLNESS:  The patient is a 65 year old male who has  had abdominal pain with nausea and vomiting x24 hours prior to  admission.  The patient reports an increase of pain with food.  There  are no alleviating factors.  He denies fevers, chills, night sweats,  diarrhea or constipation.  He denies diaphoresis, shortness of breath,  PND or orthopnea.  The patient denies recent weight loss.  He also  denies any episodes of jaundice.  The patient was seen in the emergency  department, admitted to our service.   PAST MEDICAL HISTORY:  1. COPD.  2. Depression.  3. Glaucoma.  4. Obstructive sleep apnea.  5. Gastroesophageal reflux disease.  6. Obesity.  7. Osteoarthritis.  8. Diabetes mellitus type 2, controlled.  9. History of herniated disks with chronic back pain.  10.Glaucoma.   SOCIAL HISTORY:  The patient has been off of tobacco for approximately 4-  5 years, had an extensive tobacco history previous.  Denies alcohol use.   FAMILY HISTORY:  Not available.   REVIEW OF SYSTEMS:  All other comprehensive review systems are negative.   ALLERGIES:  The patient is allergic to CODEINE.   CURRENT MEDICATIONS:  1. Aleve 600 mg p.o. t.i.d. p.r.n. pain.  2. Aspirin 81 mg p.o. daily p.r.n. pain.  3. Lidoderm patch to back 12 hours on, 12 hours off p.r.n. pain.  4. Travatan eye drops 0.004% one drop OU daily.  5. Metformin 500 mg 1 p.o. b.i.d.   PHYSICAL EXAMINATION:  GENERAL:  This is a well-developed, well-  nourished somewhat obese white male, currently in no apparent  distress.  VITAL SIGNS:  Blood pressure 143/81, heart rate 77, respiratory rate 22,  temperature 97.7.  HEENT:  No jugular venous distention or lymphadenopathy.  Oropharynx is  clear.  Mucous membranes are pink and moist.  TMs clear bilaterally.  Pupils are equal, round, and reactive to light and accommodation.  Extraocular muscles are intact.  CARDIOVASCULAR:  He has a regular rate  and rhythm without murmurs, rubs, or gallops.  PULMONARY:  Lungs are clear to auscultation bilaterally.  ABDOMEN:  Obese, nontender, nondistended without hepatosplenomegaly.  Bowel sounds are present.  He has no rebound or guarding.  EXTREMITIES:  He has no clubbing, cyanosis, or edema.  He has good  peripheral pulses, dorsalis pedis and radial arteries.  He is able to  move all extremities.  NEURO:  Cranial nerves II-XII are grossly intact.  He has no focal  neurological deficits.   STUDIES:  1. Chest x-ray shows no acute disease.  2. CT abdomen and  pelvis shows 3.8 x 4.1-cm mass in the pancreatic      tail.   LABORATORY FINDINGS:  White count 9.2, H and H 15.8 and 45, MCV 90,  platelets 170.  Sodium 137, potassium 4.4, chloride 106, bicarb 25, BUN  18, creatinine 1.03, and glucose of 112.   ASSESSMENT AND PLAN:  1. Pancreatic mass.  We will obtain MRI with pancreatic protocol, make      the patient n.p.o., place on aggressive pain medication, and      consider GI consult.  2. Chronic obstructive pulmonary disease, currently stable.  The      patient does not require additional oxygen.  3. Obstructive sleep apnea.  The patient will continue BiPAP with      settings of 11/8 at 2 L per minute O2.  4. Glaucoma.  We will continue Travatan eye drops.  5. Chronic back pain.  The patient will use pain meds aggressively as      above.  6. Osteoarthritis, as above.  7. Diabetes mellitus type 2.  We will hold insulin for now, check      hemoglobin A1c.  8. Depression, currently stable.   The patient is  full code.  H and P was constructed by reviewing past  medical history, confirmed with emergency medical room physician  reviewing the emergency medical record.  Time spent 1 hour.      Renee Ramus, MD  Electronically Signed     JF/MEDQ  D:  07/17/2009  T:  07/18/2009  Job:  [id number]   cc:   Ramon Dredge L. Juanetta Sharp, M.D.  Fax: [phone]

## 2011-05-09 NOTE — Group Therapy Note (Signed)
NAME:  ROCZEN, [name]                  ACCOUNT NO.:  [id number]   MEDICAL RECORD NO.:  [id number]          PATIENT TYPE:  INP   LOCATION:  A336                          FACILITY:  [facility]   PHYSICIAN:  Melissa L. Ladona Ridgel, MD  DATE OF BIRTH:  January 27, 1946   DATE OF PROCEDURE:  DATE OF DISCHARGE:  07/19/2009                                 PROGRESS NOTE   SUBJECTIVE:  Mr. Bednarczyk is still complaining of a band of pain across his  upper abdomen.  He says this is eased some now, but he is on morphine.  He has had no more nausea and vomiting, does not feel short of breath.  He has not had a stool in several days.  He is hungry and wants to eat.  He is complaining of some left lower quadrant abdominal pain, tells me  that he has not had a bowel movement in several days.   He has a long-term history of intermittent pancreatic-type pain that is  in the form of a band around his upper abdomen, radiates to his back.  He also has daily abdominal bloating and distention with eating.  He has  a 45 plus-year smoking history.  He does not drink any alcohol or use  recreational drugs.  He has heavy NSAID use in that he takes an 81 mg  aspirin as well as Aleve sometimes up to 3 times daily for his chronic  back pain.  He does have a family history of colon cancer in his sister  and in his aunt.  His last colonoscopy was in 2008 by Dr. Roetta Sessions.  He had diverticulosis at that time.  Colon polyps were removed.  Pathology demonstrated tubular adenoma and serrated adenoma.  No high-  grade dysplasia or malignancy was identified.      Stephani Police, PA      Melissa L. Ladona Ridgel, MD  Electronically Signed    MLY/MEDQ  D:  07/19/2009  T:  07/20/2009  Job:  [id number]

## 2011-05-10 ENCOUNTER — Encounter: Payer: Self-pay | Admitting: *Deleted

## 2011-05-10 DIAGNOSIS — R600 Localized edema: Secondary | ICD-10-CM | POA: Insufficient documentation

## 2011-05-10 DIAGNOSIS — H409 Unspecified glaucoma: Secondary | ICD-10-CM | POA: Insufficient documentation

## 2011-05-11 ENCOUNTER — Encounter: Payer: Self-pay | Admitting: *Deleted

## 2011-05-11 ENCOUNTER — Encounter: Payer: Self-pay | Admitting: Cardiology

## 2011-05-11 ENCOUNTER — Ambulatory Visit (INDEPENDENT_AMBULATORY_CARE_PROVIDER_SITE_OTHER): Payer: Medicare Other | Admitting: Cardiology

## 2011-05-11 ENCOUNTER — Other Ambulatory Visit: Payer: Self-pay | Admitting: Cardiology

## 2011-05-11 DIAGNOSIS — F17200 Nicotine dependence, unspecified, uncomplicated: Secondary | ICD-10-CM

## 2011-05-11 DIAGNOSIS — J449 Chronic obstructive pulmonary disease, unspecified: Secondary | ICD-10-CM

## 2011-05-11 DIAGNOSIS — M5137 Other intervertebral disc degeneration, lumbosacral region: Secondary | ICD-10-CM

## 2011-05-11 DIAGNOSIS — I4891 Unspecified atrial fibrillation: Secondary | ICD-10-CM

## 2011-05-11 DIAGNOSIS — G4733 Obstructive sleep apnea (adult) (pediatric): Secondary | ICD-10-CM

## 2011-05-11 DIAGNOSIS — K635 Polyp of colon: Secondary | ICD-10-CM

## 2011-05-11 DIAGNOSIS — M5136 Other intervertebral disc degeneration, lumbar region: Secondary | ICD-10-CM

## 2011-05-11 DIAGNOSIS — I951 Orthostatic hypotension: Secondary | ICD-10-CM

## 2011-05-11 DIAGNOSIS — D126 Benign neoplasm of colon, unspecified: Secondary | ICD-10-CM

## 2011-05-11 MED ORDER — ASPIRIN 325 MG PO TBEC: 325.0000 mg | DELAYED_RELEASE_TABLET | Freq: Every day | ORAL | Status: DC

## 2011-05-11 NOTE — Assessment & Plan Note (Addendum)
Heart rate remains adequately controlled in the absence of treatment with drugs with AV nodal blocking properties.  Intravenous diltiazem or beta blocker can be utilized perioperatively if heart rate increases.  Aspirin can be safely discontinued perioperatively at the preference of orthopaedic surgery.  [facility] will be available to assist in hospital as needed.

## 2011-05-11 NOTE — Assessment & Plan Note (Signed)
Mr. Wuebker discontinued cigarette smoking for 5 years in the past with the assistance of Chantix and would like to make another effort.  A prescription was provided to him with appropriate instructions.

## 2011-05-11 NOTE — Patient Instructions (Addendum)
Your physician recommends that you schedule a follow-up appointment in: 7 MONTHS Your physician has requested that you have a lexiscan myoview. For further information please visit [email or web address] Please follow instruction sheet, as given.   Your physician has recommended you make the following change in your medication:ENTERIC COATED ASPIRIN 325MG  DAILY

## 2011-05-11 NOTE — Assessment & Plan Note (Addendum)
Symptoms and blood pressure changes have resolved with increased salt intake.

## 2011-05-11 NOTE — Progress Notes (Signed)
HPI : Mr. Diekman returns to the office for continued assessment and treatment of atrial fibrillation, initially diagnosed 4 months ago.  Since that time, he has remained stable, but has a plethora of complaints including generalized fatigue, headache, leg pain, abdominal pain and chest pain.  Exercise is limited due to his orthopaedic problems.  He notes no palpitations, dyspnea, orthopnea, PND, lightheadedness or syncope.  Has abdominal and chest discomfort of aching quality present essentially continuously, but increased with activity.  Orthostatic symptoms have resolved with increased salt intake.  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 Thurston Hole.  Medical clearance is requested.  Current Outpatient Prescriptions on File Prior to Visit  Medication Sig Dispense Refill  . lidocaine (LIDODERM) 5 % Place 1 patch onto the skin daily. Remove & Discard patch within 12 hours or as directed by MD       . metFORMIN (GLUMETZA) 500 MG (MOD) 24 hr tablet Take 500 mg by mouth 2 (two) times daily.        . MORPHINE SULFATE, CONCENTRATE, PO Take 15 mg by mouth as needed.        . senna (SENOKOT) 8.6 MG tablet Take 1 tablet by mouth 2 (two) times daily.        . travoprost, benzalkonium, (TRAVATAN) 0.004 % ophthalmic solution 1 drop at bedtime.           Allergies  Allergen Reactions  . Codeine       Past medical history, social history, and family history reviewed and updated.  ROS: See history of present illness.  PHYSICAL EXAM: BP 126/82  Pulse 63  Ht 6\' 4"  (1.93 m)  Wt 264 lb (119.75 kg)  BMI 32.14 kg/m2  SpO2 98%  General-Well developed; no acute distress Body habitus-Overweight Neck-No JVD; no carotid bruits Lungs-mild expiratory rhonchi with some prolongation of the expiratory phase; resonant to percussion Cardiovascular-Irregular rhythm; normal PMI; distant S1 and S2 Abdomen-normal bowel sounds; soft and non-tender without masses or  organomegaly Musculoskeletal-No deformities, no cyanosis or clubbing Neurologic-Normal cranial nerves; symmetric strength and tone Skin-Warm, no significant lesions Extremities-distal pulses intact-1+ posterior tibials 2+ dorsalis pedis; trace edema  EKG: Atrial fibrillation with a ventricular rate of 95 bpm; delayed R-wave progression; no previous tracing for comparison.  Laboratory:  Results were excellent in January 2012 included a normal TSH, normal chemistry profile and a normal CBC.  His most recent lipid profile showed good control of hyperlipidemia.  ASSESSMENT AND PLAN:

## 2011-05-12 NOTE — Op Note (Signed)
NAME:  [name], [name]                  ACCOUNT NO.:  [id number]   MEDICAL RECORD NO.:  [id number]          PATIENT TYPE:  AMB   LOCATION:  DAY                           FACILITY:  [facility]   PHYSICIAN:  [name], M.D. DATE OF BIRTH:  1946/02/02   DATE OF PROCEDURE:  02/28/2007  DATE OF DISCHARGE:                               OPERATIVE REPORT   PROCEDURE:  Colonoscopy with snare polypectomy, polyp ablation.   INDICATIONS FOR PROCEDURE:  Patient is 60-year gentleman sent over from  Dr. Juanetta Sharp for colonoscopy.  He has history colonic adenomas.  He had a  colonoscopy in 2004 Dr. Luther Sharp down in [place], [place].  He had the adenomas removed.  He has no lower GI tract symptoms now.  There is no family history of cancer of the colon.  Colonoscopy is now  being done as surveillance maneuver.  This approach has been discussed  with the patient at length.  Potential risks, benefits and alternatives  have been reviewed, questions answered.  Please see documentation on the  medical record.   PROCEDURE NOTE:  O2 saturation, blood pressure, pulse and respirations  monitored throughout entire procedure.   CONSCIOUS SEDATION:  Versed 4 mg IV, Demerol 50 mg IV in divided doses.   INSTRUMENT:  Pentax video chip system.   FINDINGS:  Digital rectal exam revealed no abnormalities.   ENDOSCOPIC FINDINGS:  The prep was adequate.  Examination of the colonic  mucosa was undertaken from the rectosigmoid junction to the left,  transverse and right colon to area of the appendiceal orifice ,  ileocecal valve and cecum.  These structures were well seen and  photographed for the record.  From this level, the scope slowly  cautiously withdrawn.  All previously mentioned mucosal surfaces were  again seen.  The patient had the following abnormalities:  1. He had scattered pan colonic diverticula.  2. He had three 5 mm pedunculated polyps in the mid ascending colon      which were cold snared,  recovered through the scope. 3.  He had an      angry 5 mm pedunculated polyp at 35 cm which was removed with the      hot snare cautery recovered through the scope.  3. Down in the rectosigmoid and rectum there were numerous a 1-3 mm      diminutive polyps which were ablated with the tip of hot snare      cautery unit.  Otherwise, thorough examination of the rectal mucosa including  retroflexed view of the anal verge demonstrated no abnormalities.  The  patient tolerated the procedure well as reactive to endoscopy.   IMPRESSION:  1. Multiple diminutive rectosigmoid polyps ablated with the tip of the      hot snare cautery unit as described above, otherwise normal rectum.  2. Pedunculated polyps in the sigmoid and ascending colon removed with      snare, as described above.  3. Scattered pan colonic diverticula.  Remainder of colon mucosa      appeared normal.   RECOMMENDATIONS:  1. Diverticulosis literature  provided to [name].  2. No aspirin or arthritis medications for 10 days.  3. Follow-up on path.  4. Further recommendations to follow.      [name], M.D.  Electronically Signed     RMR/MEDQ  D:  02/28/2007  T:  02/28/2007  Job:  [id number]   cc:   [name], M.D.  Fax: [phone]

## 2011-05-12 NOTE — Procedures (Signed)
NAME:  [name], [name]                            ACCOUNT NO.:  [id number]   MEDICAL RECORD NO.:  [id number]                   PATIENT TYPE:  OUT   LOCATION:  RESP                                 FACILITY:  [facility]   PHYSICIAN:  Edward L. [name], M.D.             DATE OF BIRTH:  [birth year]   DATE OF PROCEDURE:  DATE OF DISCHARGE:                              PULMONARY FUNCTION TEST   FINDINGS:  1. Spirometry shows a mild ventilatory defect with evidence of air flow     obstruction.  2. Arterial blood gasses are normal.  3. There is no significant bronchodilator effect.      ___________________________________________                                            Oneal Deputy. [name], M.D.   [name]  D:  01/27/2004  T:  01/27/2004  Job:  [id number]

## 2011-05-12 NOTE — Assessment & Plan Note (Signed)
[facility]                             PULMONARY OFFICE NOTE   NAME:[name], [name]                         MRN:          [id number]  DATE:12/28/2006                            DOB:          [birth year]    PROBLEMS:  1. Obstructive sleep apnea with hypersomnia.  2. Esophageal reflux.  3. Chronic obstructive pulmonary disease.  4. Depression.  5. Tobacco.  6. Glaucoma.  7. Degenerative disc disease.   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 His wife is with him today. She says he snores  very little through his BiPAP which is set at inspiratory 11, expiratory  8 with 2 liters of oxygen and a heated humidifier. He is having  increased persistent mild dry sore throat. He tosses and tumbles around  the bed at night. He complains that his legs swell. He spends much of  his time sitting with legs down. He will be seeing Dr. [name] soon and  I suggested he talk with Dr. [name] about leg edema, because I think  that is contributing to his sleep discomfort. Additional diuresis,  elastic stockings and elevation may help. He is using Clonazepam 0.5 mg  x2 each night now. Sleep is partly disturbed by nocturia. We discussed  clues to proper CPAP pressure adjustment.   MEDICATIONS:  1. Albuterol with home nebulizer daily p.r.n.  2. Clonazepam 0.5 mg 1 or 2 nightly.  3. Furosemide 40 mg.  4. Multivitamins.  5. Protonix 40 mg.  6. Glaucoma eye drops.  7. Methazolamide 50 mg t.i.d.  8. Oxygen at 2 liters at home used occasionally p.r.n.  9. Lidoderm pain patch.  10.Aspirin 81 mg.  11.Albuterol rescue inhaler daily p.r.n.  Drug intolerance to CODEINE and ALPHAGAN DROPS.   OBJECTIVE:  Weight 311 pounds, blood pressure 126/68, pulse regular 60,  room air saturation 98%. He is quite obese, alert, breathing is not  labored. Heart sounds are regular without murmur. There is 2+ bilateral  edema below  the knees. Lungs are clear.   IMPRESSION:  1. Obstructive sleep apnea, adequately controlled with BiPAP and      supplemental oxygen. He would do better with weight loss.  2. Peripheral edema, aggravated by chronic dependent legs. Dr. [name]      may decide that related treatments need adjusting.  3. Chronic obstructive pulmonary disease, appears stable.   PLAN:  1. We are asking [facility] to increase his BiPAP pressure to      inspiratory 12/expiratory 8 for trial.  2. I have emphasized weight loss, use of support hose, and elevation      of his legs. He will discuss his peripheral edema with Dr. [name].      I have provided him with the phone number for the [facility]      Pulmonary Rehabilitation Program and he can call for information to      decide if that would be at all of interest to him.  3. Schedule return 12 months, earlier p.r.n.  Clinton D. Maple Hudson, MD, Tonny Bollman, FACP  Electronically Signed    CDY/MedQ  DD: 12/28/2006  DT: 12/29/2006  Job #: [id number]   cc:   Ramon Dredge L. [name], M.D.

## 2011-05-15 ENCOUNTER — Encounter (HOSPITAL_COMMUNITY): Admission: RE | Admit: 2011-05-15 | Payer: Medicare Other | Source: Ambulatory Visit

## 2011-05-15 ENCOUNTER — Encounter (HOSPITAL_COMMUNITY): Payer: Medicare Other

## 2011-05-15 ENCOUNTER — Encounter: Payer: Medicare Other | Admitting: *Deleted

## 2011-05-16 ENCOUNTER — Encounter: Payer: Self-pay | Admitting: Cardiology

## 2011-05-19 ENCOUNTER — Telehealth: Payer: Self-pay | Admitting: Cardiology

## 2011-05-19 NOTE — Telephone Encounter (Signed)
Orders faxed to Radiology

## 2011-05-23 ENCOUNTER — Encounter (HOSPITAL_COMMUNITY)
Admission: RE | Admit: 2011-05-23 | Discharge: 2011-05-23 | Disposition: A | Discharge status: Home / Self Care | Payer: Medicare Other | Source: Ambulatory Visit | Attending: Cardiology | Admitting: Cardiology

## 2011-05-23 ENCOUNTER — Ambulatory Visit (INDEPENDENT_AMBULATORY_CARE_PROVIDER_SITE_OTHER): Payer: Medicare Other | Admitting: *Deleted

## 2011-05-23 ENCOUNTER — Encounter (HOSPITAL_COMMUNITY): Payer: Medicare Other

## 2011-05-23 ENCOUNTER — Encounter (HOSPITAL_COMMUNITY): Payer: Self-pay

## 2011-05-23 DIAGNOSIS — I4891 Unspecified atrial fibrillation: Secondary | ICD-10-CM | POA: Insufficient documentation

## 2011-05-23 DIAGNOSIS — R079 Chest pain, unspecified: Secondary | ICD-10-CM | POA: Insufficient documentation

## 2011-05-23 HISTORY — DX: Malignant (primary) neoplasm, unspecified: C80.1

## 2011-05-23 MED ORDER — TECHNETIUM TC 99M TETROFOSMIN IV KIT
10.0000 | PACK | Freq: Once | INTRAVENOUS | Status: AC | PRN
  Administered 2011-05-23: 9.5 via INTRAVENOUS

## 2011-05-23 MED ORDER — TECHNETIUM TC 99M TETROFOSMIN IV KIT
30.0000 | PACK | Freq: Once | INTRAVENOUS | Status: AC | PRN
  Administered 2011-05-23: 28.9 via INTRAVENOUS

## 2011-05-23 NOTE — Progress Notes (Deleted)

## 2011-05-23 NOTE — Progress Notes (Signed)
Please see dictated report for Stress Nuclear Study.   

## 2011-07-03 ENCOUNTER — Telehealth: Payer: Self-pay | Admitting: *Deleted

## 2011-07-03 NOTE — Telephone Encounter (Signed)
Gave preliminary results to pt await your final report

## 2011-07-10 ENCOUNTER — Encounter (HOSPITAL_BASED_OUTPATIENT_CLINIC_OR_DEPARTMENT_OTHER): Payer: Medicare Other | Admitting: Oncology

## 2011-07-10 ENCOUNTER — Other Ambulatory Visit (HOSPITAL_COMMUNITY): Payer: Self-pay | Admitting: Oncology

## 2011-07-10 DIAGNOSIS — C8589 Other specified types of non-Hodgkin lymphoma, extranodal and solid organ sites: Secondary | ICD-10-CM

## 2011-07-10 LAB — CBC WITH DIFFERENTIAL/PLATELET
Basophils Absolute: 0.1 10*3/uL (ref 0.0–0.1)
EOS%: 2.3 % (ref 0.0–7.0)
HGB: 14.7 g/dL (ref 13.0–17.1)
MCH: 31.9 pg (ref 27.2–33.4)
MCHC: 34 g/dL (ref 32.0–36.0)
MCV: 93.9 fL (ref 79.3–98.0)
MONO%: 9.4 % (ref 0.0–14.0)
RDW: 14.4 % (ref 11.0–14.6)

## 2011-07-10 LAB — COMPREHENSIVE METABOLIC PANEL
AST: 15 U/L (ref 0–37)
Albumin: 4.1 g/dL (ref 3.5–5.2)
Alkaline Phosphatase: 56 U/L (ref 39–117)
BUN: 15 mg/dL (ref 6–23)
Creatinine, Ser: 1.03 mg/dL (ref 0.50–1.35)
Potassium: 3.7 mEq/L (ref 3.5–5.3)

## 2011-07-31 ENCOUNTER — Encounter: Payer: Self-pay | Admitting: *Deleted

## 2011-07-31 ENCOUNTER — Telehealth: Payer: Self-pay | Admitting: Cardiology

## 2011-07-31 NOTE — Telephone Encounter (Signed)
Results mailed to pt, and faxed to Drs, [name] and Thurston Hole

## 2011-09-14 ENCOUNTER — Other Ambulatory Visit (HOSPITAL_COMMUNITY): Payer: Self-pay | Admitting: Oncology

## 2011-09-14 ENCOUNTER — Encounter (HOSPITAL_BASED_OUTPATIENT_CLINIC_OR_DEPARTMENT_OTHER): Payer: Medicare Other | Admitting: Oncology

## 2011-09-14 DIAGNOSIS — C8590 Non-Hodgkin lymphoma, unspecified, unspecified site: Secondary | ICD-10-CM

## 2011-09-14 DIAGNOSIS — J189 Pneumonia, unspecified organism: Secondary | ICD-10-CM

## 2011-09-14 DIAGNOSIS — I4891 Unspecified atrial fibrillation: Secondary | ICD-10-CM

## 2011-09-14 DIAGNOSIS — C8589 Other specified types of non-Hodgkin lymphoma, extranodal and solid organ sites: Secondary | ICD-10-CM

## 2011-09-14 DIAGNOSIS — Z7901 Long term (current) use of anticoagulants: Secondary | ICD-10-CM

## 2011-09-14 LAB — COMPREHENSIVE METABOLIC PANEL
AST: 20 U/L (ref 0–37)
BUN: 18 mg/dL (ref 6–23)
CO2: 22 mEq/L (ref 19–32)
Calcium: 9.4 mg/dL (ref 8.4–10.5)
Chloride: 102 mEq/L (ref 96–112)
Creatinine, Ser: 1.01 mg/dL (ref 0.50–1.35)
Total Bilirubin: 0.5 mg/dL (ref 0.3–1.2)

## 2011-09-14 LAB — CBC WITH DIFFERENTIAL/PLATELET
Basophils Absolute: 0 10*3/uL (ref 0.0–0.1)
EOS%: 1.2 % (ref 0.0–7.0)
HCT: 47 % (ref 38.4–49.9)
HGB: 15.9 g/dL (ref 13.0–17.1)
LYMPH%: 24.4 % (ref 14.0–49.0)
MCH: 31.9 pg (ref 27.2–33.4)
NEUT%: 67.1 % (ref 39.0–75.0)
Platelets: 196 10*3/uL (ref 140–400)
lymph#: 2.7 10*3/uL (ref 0.9–3.3)

## 2011-09-14 LAB — LACTATE DEHYDROGENASE: LDH: 162 U/L (ref 94–250)

## 2011-11-13 ENCOUNTER — Encounter: Payer: Self-pay | Admitting: Physician Assistant

## 2011-11-14 ENCOUNTER — Other Ambulatory Visit (HOSPITAL_COMMUNITY): Payer: Self-pay | Admitting: Oncology

## 2011-11-14 ENCOUNTER — Other Ambulatory Visit (HOSPITAL_BASED_OUTPATIENT_CLINIC_OR_DEPARTMENT_OTHER): Payer: Medicare Other

## 2011-11-14 ENCOUNTER — Ambulatory Visit (HOSPITAL_COMMUNITY)
Admission: RE | Admit: 2011-11-14 | Discharge: 2011-11-14 | Disposition: A | Discharge status: Home / Self Care | Payer: Medicare Other | Source: Ambulatory Visit | Attending: Oncology | Admitting: Oncology

## 2011-11-14 ENCOUNTER — Ambulatory Visit: Payer: Medicare Other | Admitting: Physician Assistant

## 2011-11-14 DIAGNOSIS — C8590 Non-Hodgkin lymphoma, unspecified, unspecified site: Secondary | ICD-10-CM

## 2011-11-14 DIAGNOSIS — C8589 Other specified types of non-Hodgkin lymphoma, extranodal and solid organ sites: Secondary | ICD-10-CM | POA: Insufficient documentation

## 2011-11-14 DIAGNOSIS — J189 Pneumonia, unspecified organism: Secondary | ICD-10-CM

## 2011-11-14 DIAGNOSIS — J4489 Other specified chronic obstructive pulmonary disease: Secondary | ICD-10-CM | POA: Insufficient documentation

## 2011-11-14 DIAGNOSIS — J449 Chronic obstructive pulmonary disease, unspecified: Secondary | ICD-10-CM | POA: Insufficient documentation

## 2011-11-14 DIAGNOSIS — N200 Calculus of kidney: Secondary | ICD-10-CM | POA: Insufficient documentation

## 2011-11-14 DIAGNOSIS — E119 Type 2 diabetes mellitus without complications: Secondary | ICD-10-CM | POA: Insufficient documentation

## 2011-11-14 LAB — CBC WITH DIFFERENTIAL/PLATELET
EOS%: 2.1 % (ref 0.0–7.0)
Eosinophils Absolute: 0.2 10*3/uL (ref 0.0–0.5)
LYMPH%: 27.9 % (ref 14.0–49.0)
MCH: 31.2 pg (ref 27.2–33.4)
MCV: 93.6 fL (ref 79.3–98.0)
MONO%: 6.9 % (ref 0.0–14.0)
NEUT#: 5.8 10*3/uL (ref 1.5–6.5)
Platelets: 178 10*3/uL (ref 140–400)
RBC: 4.73 10*6/uL (ref 4.20–5.82)

## 2011-11-14 LAB — CMP (CANCER CENTER ONLY)
AST: 22 U/L (ref 11–38)
Alkaline Phosphatase: 55 U/L (ref 26–84)
BUN, Bld: 17 mg/dL (ref 7–22)
Glucose, Bld: 100 mg/dL (ref 73–118)
Sodium: 139 mEq/L (ref 128–145)
Total Bilirubin: 0.5 mg/dl (ref 0.20–1.60)
Total Protein: 7.1 g/dL (ref 6.4–8.1)

## 2011-11-14 MED ORDER — IOHEXOL 300 MG/ML  SOLN
125.0000 mL | Freq: Once | INTRAMUSCULAR | Status: AC | PRN
  Administered 2011-11-14: 125 mL via INTRAVENOUS

## 2011-11-19 ENCOUNTER — Other Ambulatory Visit: Payer: Self-pay | Admitting: Oncology

## 2011-11-20 ENCOUNTER — Telehealth: Payer: Self-pay | Admitting: Medical Oncology

## 2011-11-20 NOTE — Telephone Encounter (Signed)
Pt called asking for his chest x-ray results and his CT results. He had an appointment with Dr. Arline Asp and he had to leave that day due to not feeling well. He also would like to know about getting his port out and his next appointment.

## 2012-01-05 ENCOUNTER — Telehealth: Payer: Self-pay

## 2012-01-05 NOTE — Telephone Encounter (Signed)
Called pt to ask why he is seeing Dr Larena Sox as a new pt on 1/22.  Pt was upset with not seeing MD on his appts but seeing the NP. Also wanting his port taken out and Dr Arline Asp not addressing that issue. He also said he would end up taking the whole day to come to the clinic. The pt spoke for about 16 minutes with voicing his displeasure in the scheduling issues. He stated he was not upset w/Dr Murinson as a clinician but with all the timing issues. And the fact he has a port and most of the people still stuck him for blood b/c they were not qualified to access the port.This message forwarded to Dr Arline Asp and also Thea Gist in service recovery.

## 2012-01-16 ENCOUNTER — Encounter (HOSPITAL_COMMUNITY): Payer: Medicare Other | Attending: Oncology | Admitting: Oncology

## 2012-01-16 ENCOUNTER — Encounter (HOSPITAL_COMMUNITY): Payer: Medicare Other

## 2012-01-16 ENCOUNTER — Encounter (HOSPITAL_COMMUNITY): Payer: Self-pay | Admitting: Oncology

## 2012-01-16 VITALS — BP 124/82 | HR 101 | Temp 98.0°F | Ht 74.5 in | Wt 250.0 lb

## 2012-01-16 DIAGNOSIS — C8589 Other specified types of non-Hodgkin lymphoma, extranodal and solid organ sites: Secondary | ICD-10-CM | POA: Insufficient documentation

## 2012-01-16 DIAGNOSIS — E119 Type 2 diabetes mellitus without complications: Secondary | ICD-10-CM

## 2012-01-16 DIAGNOSIS — Z87898 Personal history of other specified conditions: Secondary | ICD-10-CM

## 2012-01-16 DIAGNOSIS — C8590 Non-Hodgkin lymphoma, unspecified, unspecified site: Secondary | ICD-10-CM

## 2012-01-16 DIAGNOSIS — L989 Disorder of the skin and subcutaneous tissue, unspecified: Secondary | ICD-10-CM

## 2012-01-16 LAB — DIFFERENTIAL
Lymphs Abs: 3.4 10*3/uL (ref 0.7–4.0)
Monocytes Relative: 7 % (ref 3–12)
Neutro Abs: 5.6 10*3/uL (ref 1.7–7.7)
Neutrophils Relative %: 57 % (ref 43–77)

## 2012-01-16 LAB — COMPREHENSIVE METABOLIC PANEL
Alkaline Phosphatase: 59 U/L (ref 39–117)
BUN: 17 mg/dL (ref 6–23)
CO2: 24 mEq/L (ref 19–32)
Chloride: 104 mEq/L (ref 96–112)
GFR calc Af Amer: >90 mL/min (ref >90)
Glucose, Bld: 93 mg/dL (ref 70–99)
Potassium: 3.7 mEq/L (ref 3.5–5.1)
Total Bilirubin: 0.3 mg/dL (ref 0.3–1.2)

## 2012-01-16 LAB — CBC
Hemoglobin: 14 g/dL (ref 13.0–17.0)
RBC: 4.56 MIL/uL (ref 4.22–5.81)

## 2012-01-16 LAB — LACTATE DEHYDROGENASE: LDH: 140 U/L (ref 94–250)

## 2012-01-16 MED ORDER — HEPARIN SOD (PORK) LOCK FLUSH 100 UNIT/ML IV SOLN
500.0000 [IU] | Freq: Once | INTRAVENOUS | Status: AC
  Filled 2012-01-16: qty 5

## 2012-01-16 MED ORDER — SODIUM CHLORIDE 0.9 % IJ SOLN
10.0000 mL | INTRAMUSCULAR | Status: DC | PRN
  Administered 2012-01-16: 10 mL via INTRAVENOUS
  Filled 2012-01-16: qty 10

## 2012-01-16 MED ORDER — HEPARIN SOD (PORK) LOCK FLUSH 100 UNIT/ML IV SOLN
INTRAVENOUS | Status: AC
  Administered 2012-01-16: 500 [IU] via INTRAVENOUS
  Filled 2012-01-16: qty 5

## 2012-01-16 MED ORDER — SODIUM CHLORIDE 0.9 % IJ SOLN
INTRAMUSCULAR | Status: AC
  Administered 2012-01-16: 10 mL via INTRAVENOUS
  Filled 2012-01-16: qty 10

## 2012-01-16 MED ORDER — SODIUM CHLORIDE 0.9 % IJ SOLN
10.0000 mL | INTRAMUSCULAR | Status: AC | PRN
  Filled 2012-01-16: qty 10

## 2012-01-16 MED ORDER — HEPARIN SOD (PORK) LOCK FLUSH 100 UNIT/ML IV SOLN
500.0000 [IU] | Freq: Once | INTRAVENOUS | Status: AC
  Administered 2012-01-16: 500 [IU] via INTRAVENOUS
  Filled 2012-01-16: qty 5

## 2012-01-16 MED ORDER — SODIUM CHLORIDE 0.9 % IJ SOLN
INTRAMUSCULAR | Status: AC
  Filled 2012-01-16: qty 10

## 2012-01-16 NOTE — Progress Notes (Signed)
CC:   Edward L. [name], M.D. Gerrit Friends. Dietrich Pates, MD, [facility] Samul Dada, M.D.  DIAGNOSES: 1. 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  He had 7     cycles of chemotherapy with R-CHOP and Neulasta support.  His last     PET scan in 2012 showed no evidence of recurrence.  He had CAT     scans in November 2012 which also did not reveal any recurrent     Disease.His disease was limited to the spleen and pancreas. 2. Post-biopsy splenic abscess 2010 treated with drainage and     antibiotics. 3. Diabetes mellitus x10 years on metformin 500 mg b.i.d. 4. Chronic abdominal pain with what appears to be a large amount of     stool residual in his colon on recent CT scans and PET scans. 5. Constipation alternating with diarrhea at times. 6. Chronic obstructive pulmonary disease.  Now back to smoking about a     half-pack to a pack of cigarettes a day. 7. Skin cancer on the upper back removed by Dr. Suan Halter, he states     years ago.  He has a lesion on the right breast area 1.5 cm across     which is very worrisome for squamous cell or basal cell carcinoma.     We will get him back to Dr. [name].  He also has a 3-4 cm irregular     lesion in the base of the left neck worrisome for the skin cancer     as well. 8. Left cataract operation with lens implant years ago. 9. 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     He has seen Dr. Salvatore Marvel in the past and may need to see him     again for hip replacement surgery. 11.Port-A-Cath placed in the past for chemotherapy.  We would like to     have that removed. 12.Atrial fibrillation for which he is seeing Dr. [place] Rep[place] Bing here     at [facility].  He takes aspirin 325 mg for this, he states.  HISTORY:  This is a pleasant 66 year old gentlemen who started having left-sided abdominal pain back  in 2010, came to the above-mentioned diagnosis.  His PET scan shows splenic involvement, pancreatic tail involvement, no disease outside of that area, interestingly.  I have staged him as basically a IIS and perhaps he is better staged as a IISE potentially.  But, he was treated by Dr. Johney Frame with 7 cycles of R- CHOP and Mr. Pickup states he could not take any more chemotherapy at that time "because of his blood work."  He does have morphine which he takes for pain, but he uses that, it sounds like, more for diffuse abdominal pain.  He had seen Dr. Karilyn Cota years ago for EGD and colonoscopy.  He states that some of this discomfort was present before.  I am wondering whether he has chronic constipation since on review his CAT scans he has tremendous amounts of stool in his colon.  He was married 1 time but is divorced.  He is from this [place].  Used to work for the [employer].  Went on disability years ago.  He was able to quit smoking one time with the help of Chantix from Dr. [name] and he has a prescription for  that to try again, he states.  He had blood work back in November which showed a normal CBC, unremarkable differential.  He had a CMET which was also unremarkable. He had a normal BUN, normal creatinine, glucose of 100, normal electrolytes, normal liver enzymes.  LDH was 130 at that time.  SOCIAL HISTORY:  As mentioned above.  FAMILY HISTORY:  Noncontributory.  PHYSICAL EXAM:  Vital Signs:  He is 6 feet, 2 and 0.5 inch is tall, states that he was 6 foot 4 at one time.  He weighs 250 pounds; at one time weighed over 350 pounds, he states.  Blood pressure today 124/82, pulse right around 100 and regular, respirations 18 and unlabored. Temperature is normal.  He complains of pain at a score of 4.  He has hip pain.  He has abdominal pain.  He has a skin lesion mentioned above over the right breast, one also at the base left neck.  He has a little area  over the right scapula which has a little scab over it.  I think he needs to show that to the dermatologist as well.  There are a couple of nonspecific itchy areas on the right foot at the base of the 4th and 5th toes, which are not specific to me.  He has no adenopathy in the cervical, supraclavicular, infraclavicular, axillary, or inguinal areas. He has upper and lower dental plates.  Tongue is unremarkable.  Throat is clear.  He has a left pupillary change of a prior cataract operation. Right shows a cataract and a light-responsive pupil.  He is alert and oriented.  He has reddish hair.  He has a port in the right upper chest wall.  Lungs:  Clear to auscultation and percussion.  Heart:  Irregular rhythm but normal rate right around 100.  He has no obvious S3 gallop or murmur.  His abdomen is slightly tender in the lower left quadrant.  He has no obvious hepatosplenomegaly.  Bowel sounds are diminished without question.  He has no peripheral edema.  Pulses in his feet are trace to 1+ only.  He is right-handed.  He also does complain of review of systems of some jerkiness in the right hand sometimes when he is holding a cup of coffee or a drink, or a utensil.  He has also seen Dr. Vickey Huger in the past for this which she supposedly has made a diagnosis, he states, but he does not remember what it was.  ASSESSMENT AND PLAN:  I have reviewed his scans today I think we should do 1 more PET scan this coming month and then if he is okay he would like to have his port removed which I think is reasonable.  He states it bothers him but there are lot of things that this gentleman is not happy with; namely his skin, his abdominal pain, his bowels, his hips.  I think he needs to take one of these issues at a time.  I think the lymphoma needs to be re-evaluated.  We need to repeat the blood work which we will do today.  We will also do a hemoglobin A1c at the request of Dr. [name].  If everything  checks out from the lymphoma standpoint, what I have asked him to do, if his pain is not better with MiraLAX 1 cap full twice a day, I would like him to see Dr. Karilyn Cota because I think his abdominal pain does not need to be treated with morphine and needs  to be treated with getting his bowels cleaned out and moving on a regular basis.  He states that he will try the MiraLAX.  If the hips continue to deteriorate, which they look like they will on his scans, he needs to see Dr. Thurston Hole because as he gets older Mr. Ursin needs to be up on his feet.  We will see him back in a month.  We will go over things then and we will get him a dermatology appointment as soon as possible.    ______________________________ Ladona Horns. Mariel Sleet, MD ESN/MEDQ  D:  01/16/2012  T:  01/16/2012  Job:  [id number]

## 2012-01-16 NOTE — Progress Notes (Signed)
[name] presented for Portacath access and flush. Proper placement of portacath confirmed by CXR. Portacath located right chest wall accessed with  H 20 needle. Good blood return present and blood drawn for lab work. Portacath flushed with 20ml NS and 500U/60ml Heparin and needle removed intact. Procedure without incident. Patient tolerated procedure well.

## 2012-01-16 NOTE — Patient Instructions (Addendum)
[name]  [id number] [birth year]   [facility]  Discharge Instructions  RECOMMENDATIONS MADE BY THE CONSULTANT AND ANY TEST RESULTS WILL BE SENT TO YOUR REFERRING DOCTOR.   EXAM FINDINGS BY MD TODAY AND SIGNS AND SYMPTOMS TO REPORT TO CLINIC OR PRIMARY MD: Will do some labs today and get you scheduled for a PET scan and bring you back to see Dr. Mariel Sleet in about 1 month.  We will get you an appointment to be seen by dermatologist.  You have several lesions that are worrisome.  One is on your left neck, one is on your right breast and one is on your right shoulder blade. You also need to make sure your bowels are emptying well so we want you to take Miralax twice daily.  If you want [place] to get you an appointment with Dr. Karilyn Cota, let us know.  You also need to stop smoking.  That would improve your health considerably.  MEDICATIONS PRESCRIBED: Miralax (over-the-counter)  Take 17 grams twice daily.     SPECIAL INSTRUCTIONS/FOLLOW-UP: Xray Studies Needed PET scan within next couple of weeks and Return to Clinic to see Dr. Mariel Sleet in 1 month.  We will call you with the dates and times for your appointments.   I acknowledge that I have been informed and understand all the instructions given to me and received a copy. I do not have any more questions at this time, but understand that I may call the Specialty Clinic at [facility] at [phone] during business hours should I have any further questions or need assistance in obtaining follow-up care.    __________________________________________  _____________  __________ Signature of Patient or Authorized Representative            Date                   Time    __________________________________________ Nurse's Signature

## 2012-01-16 NOTE — Progress Notes (Signed)
This office note has been dictated.

## 2012-01-17 ENCOUNTER — Telehealth (HOSPITAL_COMMUNITY): Payer: Self-pay

## 2012-01-17 LAB — HEMOGLOBIN A1C
Hgb A1c MFr Bld: 5.7 % — ABNORMAL HIGH (ref <5.7)
Mean Plasma Glucose: 117 mg/dL — ABNORMAL HIGH (ref <117)

## 2012-01-17 LAB — IGG, IGA, IGM: IgM, Serum: 50 mg/dL — ABNORMAL LOW (ref 41–251)

## 2012-01-17 NOTE — Telephone Encounter (Signed)
Patient notified that has appointment with Amber @ Dr. Scharlene Gloss office on 1/29 @ 12:30pm, PET scan scheduled for 2/5 and to see Dr. Mariel Sleet 2/19.

## 2012-01-30 ENCOUNTER — Encounter (HOSPITAL_COMMUNITY): Payer: Self-pay

## 2012-01-30 ENCOUNTER — Encounter (HOSPITAL_COMMUNITY)
Admission: RE | Admit: 2012-01-30 | Discharge: 2012-01-30 | Disposition: A | Discharge status: Home / Self Care | Payer: Medicare Other | Source: Ambulatory Visit | Attending: Oncology | Admitting: Oncology

## 2012-01-30 DIAGNOSIS — C8590 Non-Hodgkin lymphoma, unspecified, unspecified site: Secondary | ICD-10-CM

## 2012-01-30 DIAGNOSIS — I700 Atherosclerosis of aorta: Secondary | ICD-10-CM | POA: Insufficient documentation

## 2012-01-30 DIAGNOSIS — K5730 Diverticulosis of large intestine without perforation or abscess without bleeding: Secondary | ICD-10-CM | POA: Insufficient documentation

## 2012-01-30 DIAGNOSIS — C8589 Other specified types of non-Hodgkin lymphoma, extranodal and solid organ sites: Secondary | ICD-10-CM | POA: Insufficient documentation

## 2012-01-30 MED ORDER — FLUDEOXYGLUCOSE F - 18 (FDG) INJECTION
16.4000 | Freq: Once | INTRAVENOUS | Status: AC | PRN
  Administered 2012-01-30: 16.4 via INTRAVENOUS

## 2012-02-13 ENCOUNTER — Encounter (HOSPITAL_COMMUNITY): Payer: Medicare Other | Attending: Oncology | Admitting: Oncology

## 2012-02-13 ENCOUNTER — Encounter (HOSPITAL_COMMUNITY): Payer: Self-pay | Admitting: Oncology

## 2012-02-13 VITALS — BP 119/75 | HR 72 | Temp 98.4°F | Wt 249.0 lb

## 2012-02-13 DIAGNOSIS — Z87898 Personal history of other specified conditions: Secondary | ICD-10-CM | POA: Insufficient documentation

## 2012-02-13 DIAGNOSIS — J449 Chronic obstructive pulmonary disease, unspecified: Secondary | ICD-10-CM

## 2012-02-13 DIAGNOSIS — G5790 Unspecified mononeuropathy of unspecified lower limb: Secondary | ICD-10-CM

## 2012-02-13 DIAGNOSIS — C8589 Other specified types of non-Hodgkin lymphoma, extranodal and solid organ sites: Secondary | ICD-10-CM

## 2012-02-13 NOTE — Progress Notes (Signed)
CC:   Samul Dada, M.D. Edward L. [name], M.D. Gerrit Friends. Dietrich Pates, MD, [facility]  DIAGNOSES: 1. Stage IIAES diffuse large B-cell lymphoma, established on     08/20/2009 via splenic biopsy and pancreatic tail mass biopsy,     status post 7 cycles of R-CHOP with Neulasta support.  His PET scan     in 2012 showed no evidence of disease.  His PET scan now shows no     evidence of recurrent disease, but there is a questionable     prevascular lymph node that is 5 mm that is present with     questionable uptake and needs a repeat test in 6 months. 2. Post biopsy splenic abscess in 2010 treated with drainage and     antibiotics. 3. Chronic back pain times many years, and he saw Dr. Demaris Callander 10     years ago or more for this chronic back pain, which radiates around     the left flank. 4. Constipation alternating with diarrhea. 5. Chronic obstructive pulmonary disease. Still smoking.  I have asked     him to quit.  He has promised to quit, now with this questionable     lymph node abnormality. 6. Skin cancer in the upper back removed by Dr. Suan Halter in the past,     and he has a lesion on the right chest wall that needs to be     removed. 7. 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 8. Left cataract operation lens implant years ago. 9. Bilateral avascular necrosis of the hips, which he uses a cane,     having seen Dr. Thurston Hole in the past and may need to see him again. 10.Port-A-Cath placement in the past, and he wants that removed dearly     and that is fine, as long as he does not mind having it replaced if     need be, and he states he is fine with that. 11.Atrial fibrillation, on aspirin, followed by Dr. Dietrich Pates.  DISCUSSION:  Elan actually brought his ex-wife, Berton Mount, here to have another set of ears, and he is feeling good except for this chronic flank pain.  It turns out he saw Dr. Floyce Stakes 10 years ago or more for this, had lots of biofeedback therapy, etc.  Never got any  better.  Does not want to see anyone else about it he states. But his labs show a mild hypogammaglobulinemia.  His IgG is actually reported as being low, but it is within the normal range.  IgA is within the normal range.  IgM is slightly low. So sedimentation rate is normal.  CMET is unremarkable. Beta 2 microglobulin is normal.  LDH is normal, and the PET scan shows no definitive evidence for recurrent disease.  His IgM is also slightly low at 50 I should add it states, but even that is within the normal range looking at the ranges that are given by the lab. So why they have called the IgG and IgM low is unclear to me.  All 3 are within the normal range.  We will see him back in 6 months with a repeat of labs. I will not do the immunoglobulins, but we will do labs and a PET scan and see him then.  He is delighted with the news.  He still complains about his flank pain.  I have offered him a consultation with Dr. Thyra Breed, but I have not offered him any change of medication.  ______________________________ Ladona Horns. Mariel Sleet, MD ESN/MEDQ  D:  02/13/2012  T:  02/13/2012  Job:  [id number]

## 2012-02-13 NOTE — Patient Instructions (Signed)
[name]  [id number] June 12, 1946   [facility]  Discharge Instructions  RECOMMENDATIONS MADE BY THE CONSULTANT AND ANY TEST RESULTS WILL BE SENT TO YOUR REFERRING DOCTOR.   EXAM FINDINGS BY MD TODAY AND SIGNS AND SYMPTOMS TO REPORT TO CLINIC OR PRIMARY MD: Scan was ok.  Will repeat your blood work and PET scan in 6 months.  Report any new lumps, night sweats, fevers, recurrent infections, etc. We will contact Interventional Radiology to schedule an appointment for you to get your port removed and will call you with the date and time.  MEDICATIONS PRESCRIBED: none      SPECIAL INSTRUCTIONS/FOLLOW-UP: Lab work Needed in 6 months, Xray Studies Needed in 6 months and Return to Clinic on  After scans.   I acknowledge that I have been informed and understand all the instructions given to me and received a copy. I do not have any more questions at this time, but understand that I may call the Specialty Clinic at [facility] at [phone] during business hours should I have any further questions or need assistance in obtaining follow-up care.    __________________________________________  _____________  __________ Signature of Patient or Authorized Representative            Date                   Time    __________________________________________ Nurse's Signature

## 2012-02-13 NOTE — Progress Notes (Signed)
This office note has been dictated.

## 2012-02-15 NOTE — Progress Notes (Signed)
Addended by: Edythe Lynn A on: 02/15/2012 08:24 AM   Modules accepted: Orders

## 2012-02-16 ENCOUNTER — Other Ambulatory Visit: Payer: Self-pay | Admitting: Physician Assistant

## 2012-02-21 ENCOUNTER — Other Ambulatory Visit: Payer: Self-pay | Admitting: Radiology

## 2012-02-21 ENCOUNTER — Telehealth (HOSPITAL_COMMUNITY): Payer: Self-pay | Admitting: Oncology

## 2012-02-21 ENCOUNTER — Encounter (HOSPITAL_COMMUNITY): Payer: Self-pay | Admitting: Pharmacy Technician

## 2012-02-22 ENCOUNTER — Other Ambulatory Visit: Payer: Self-pay | Admitting: Radiology

## 2012-02-22 ENCOUNTER — Ambulatory Visit (HOSPITAL_COMMUNITY): Admission: RE | Admit: 2012-02-22 | Payer: Medicare Other | Source: Ambulatory Visit

## 2012-02-23 ENCOUNTER — Ambulatory Visit (HOSPITAL_COMMUNITY)
Admission: RE | Admit: 2012-02-23 | Discharge: 2012-02-23 | Disposition: A | Discharge status: Home / Self Care | Payer: Medicare Other | Source: Ambulatory Visit | Attending: Oncology | Admitting: Oncology

## 2012-02-23 ENCOUNTER — Encounter (HOSPITAL_COMMUNITY): Payer: Self-pay

## 2012-02-23 DIAGNOSIS — J4489 Other specified chronic obstructive pulmonary disease: Secondary | ICD-10-CM | POA: Insufficient documentation

## 2012-02-23 DIAGNOSIS — J449 Chronic obstructive pulmonary disease, unspecified: Secondary | ICD-10-CM | POA: Insufficient documentation

## 2012-02-23 DIAGNOSIS — Z87898 Personal history of other specified conditions: Secondary | ICD-10-CM

## 2012-02-23 DIAGNOSIS — K219 Gastro-esophageal reflux disease without esophagitis: Secondary | ICD-10-CM | POA: Insufficient documentation

## 2012-02-23 DIAGNOSIS — Z7982 Long term (current) use of aspirin: Secondary | ICD-10-CM | POA: Insufficient documentation

## 2012-02-23 DIAGNOSIS — G4733 Obstructive sleep apnea (adult) (pediatric): Secondary | ICD-10-CM | POA: Insufficient documentation

## 2012-02-23 DIAGNOSIS — C8589 Other specified types of non-Hodgkin lymphoma, extranodal and solid organ sites: Secondary | ICD-10-CM | POA: Insufficient documentation

## 2012-02-23 DIAGNOSIS — Z79899 Other long term (current) drug therapy: Secondary | ICD-10-CM | POA: Insufficient documentation

## 2012-02-23 DIAGNOSIS — Z452 Encounter for adjustment and management of vascular access device: Secondary | ICD-10-CM | POA: Insufficient documentation

## 2012-02-23 DIAGNOSIS — E119 Type 2 diabetes mellitus without complications: Secondary | ICD-10-CM | POA: Insufficient documentation

## 2012-02-23 LAB — CBC
HCT: 43.4 % (ref 39.0–52.0)
MCH: 31.1 pg (ref 26.0–34.0)
MCV: 92.5 fL (ref 78.0–100.0)
Platelets: 202 10*3/uL (ref 150–400)
RDW: 14.4 % (ref 11.5–15.5)
WBC: 9 10*3/uL (ref 4.0–10.5)

## 2012-02-23 MED ORDER — LIDOCAINE HCL 1 % IJ SOLN
INTRAMUSCULAR | Status: AC
  Filled 2012-02-23: qty 20

## 2012-02-23 MED ORDER — CEFAZOLIN SODIUM-DEXTROSE 2-3 GM-% IV SOLR
INTRAVENOUS | Status: AC
  Filled 2012-02-23: qty 50

## 2012-02-23 MED ORDER — CEFAZOLIN SODIUM-DEXTROSE 2-3 GM-% IV SOLR
2.0000 g | Freq: Once | INTRAVENOUS | Status: AC
  Administered 2012-02-23: 2 g via INTRAVENOUS

## 2012-02-23 MED ORDER — SODIUM CHLORIDE 0.9 % IV SOLN: INTRAVENOUS | Status: DC

## 2012-02-23 NOTE — Discharge Instructions (Signed)
  INCISION SITE CARE  The incision site may have small adhesive strips on it. This helps keep the incision site closed. Sometimes, no adhesive strips are placed. Instead of adhesive strips, a special kind of surgical glue is used to keep the incision closed.   If adhesive strips were placed on the incision sites, do not take them off. They will fall off on their own.   The incision site may be sore for 1 to 2 days. Pain medicine can help.   Do not get the incision site wet. Bathe or shower as directed by your caregiver.   The incision site should heal in 5 to 7 days. A small scar may form after the incision has healed.    SEEK IMMEDIATE MEDICAL CARE IF:   Your port does not flush or you are unable to get a blood return.   New drainage or pus is coming from the incision.   A bad smell is coming from the incision site.   You develop swelling or increased redness at the incision site.   You develop increased swelling or pain at the port site.   You develop swelling or pain in the surrounding skin near the port.   You have an oral temperature above 102 F (38.9 C), not controlled by medicine.  MAKE SURE YOU:   Understand these instructions.   Will watch your condition.   Will get help right away if you are not doing well or get worse.  Document Released: 12/11/2005 Document Revised: 08/23/2011 Document Reviewed: 03/04/2009 [facility] Patient Information 2012 [place], [place].

## 2012-02-23 NOTE — Procedures (Signed)
Procedure:  Right chest port removal Uncomplicated.  See separately dictated report.

## 2012-02-23 NOTE — H&P (Signed)
Agree 

## 2012-02-23 NOTE — H&P (Signed)
[name] is an 66 y.o. male.   Chief Complaint: Hx Lymphoma; no treatment for over 2 yrs HPI: Port a Cath removal today  Past Medical History  Diagnosis Date  . Atrial fibrillation   . COPD (chronic obstructive pulmonary disease)     mild PFT abnormalities; normal ABG  . Tobacco abuse     45 pack years  . Orthostatic hypotension     lightheadness; no definate loss of consciousness  . Non Hodgkin's lymphoma 06/2009    chemotherapy until 11/2010  . Splenic abscess 07/2009  . Diabetes mellitus     type 2  . Obesity   . Pedal edema   . Glaucoma   . Obstructive sleep apnea     treated with CPAP  . GERD (gastroesophageal reflux disease)   . Degenerative disc disease, lumbar     HNP of the LS spine  . Colonic polyp   . Avascular necrosis of bones of both hips   . Atherosclerosis of abdominal aorta     asymptomatic; DX by CT scan  . Cancer   . Hx MRSA infection 2010    sith splenic abcess    Past Surgical History  Procedure Date  . Abscess drainage 2010    Splenic  . Colonoscopy w/ polypectomy 2008    with snare polypectomy  . Cataract extraction     Right with lens implant  . Lymph node biopsy 2010    done x 2 for NHL diagnosis    Family History  Problem Relation Age of Onset  . Heart failure Mother 56    results of chf  . Leukemia Father   . Colon cancer Sister   . Heart disease Brother   . Heart disease Brother   . Heart disease Brother   . Heart disease Brother    Social History:  reports that he has been smoking Cigarettes.  He has a 40 pack-year smoking history. He does not have any smokeless tobacco history on file. He reports that he does not drink alcohol or use illicit drugs.  Allergies:  Allergies  Allergen Reactions  . Codeine Other (See Comments)    Shakes   . Prednisolone Acetate Other (See Comments)    redness    Medications Prior to Admission  Medication Sig Dispense Refill  . albuterol (ACCUNEB) 0.63 MG/3ML nebulizer solution Take 1  ampule by nebulization every 6 (six) hours as needed. Wheezing      . albuterol (PROVENTIL HFA;VENTOLIN HFA) 108 (90 BASE) MCG/ACT inhaler Inhale 2 puffs into the lungs every 6 (six) hours as needed. Wheezing      . aspirin 325 MG EC tablet Take 325 mg by mouth daily after breakfast.      . lidocaine (LIDODERM) 5 % Place 1 patch onto the skin daily as needed. Remove & Discard patch within 12 hours or as directed by MD. Pain       . metFORMIN (GLUMETZA) 500 MG (MOD) 24 hr tablet Take 500 mg by mouth 2 (two) times daily.        . MORPHINE SULFATE, CONCENTRATE, PO Take 15 mg by mouth every 4 (four) hours as needed. Pain       . senna (SENOKOT) 8.6 MG tablet Take 1 tablet by mouth 2 (two) times daily as needed. Constipation       . sodium chloride (AYR) 0.65 % nasal spray Place 1 spray into the nose as needed. Allergies      . travoprost, benzalkonium, (  TRAVATAN) 0.004 % ophthalmic solution Place 1 drop into both eyes at bedtime.        Medications Prior to Admission  Medication Dose Route Frequency Provider Last Rate Last Dose  . 0.9 %  sodium chloride infusion   Intravenous Continuous D Jeananne Rama, PA      . heparin lock flush 100 unit/mL  500 Units Intravenous Once [name], MD      . sodium chloride 0.9 % injection 10 mL  10 mL Intravenous PRN [name], MD        Results for orders placed during the hospital encounter of 02/23/12 (from the past 48 hour(s))  CBC     Status: Normal   Collection Time   02/23/12  9:14 AM      Component Value Range Comment   WBC 9.0  4.0 - 10.5 (K/uL)    RBC 4.69  4.22 - 5.81 (MIL/uL)    Hemoglobin 14.6  13.0 - 17.0 (g/dL)    HCT 16.1  09.6 - 04.5 (%)    MCV 92.5  78.0 - 100.0 (fL)    MCH 31.1  26.0 - 34.0 (pg)    MCHC 33.6  30.0 - 36.0 (g/dL)    RDW 40.9  81.1 - 91.4 (%)    Platelets 202  150 - 400 (K/uL)   GLUCOSE, CAPILLARY     Status: Normal   Collection Time   02/23/12  9:27 AM      Component Value Range Comment   Glucose-Capillary  97  70 - 99 (mg/dL)    No results found.  Review of Systems  Constitutional: Negative for fever.  Respiratory: Negative for cough.   Cardiovascular: Negative for chest pain.  Gastrointestinal: Negative for nausea and vomiting.  Neurological: Negative for headaches.    Blood pressure 120/63, pulse 90, temperature 97.8 F (36.6 C), temperature source Oral, resp. rate 16, height 6' 2.5" (1.892 m), weight 249 lb (112.946 kg), SpO2 100.00%. Physical Exam  Constitutional: He is oriented to person, place, and time. He appears well-developed and well-nourished.  HENT:  Head: Normocephalic.  Eyes: EOM are normal.  Neck: Normal range of motion.  Cardiovascular: Normal rate, regular rhythm and normal heart sounds.   No murmur heard. Respiratory: Effort normal and breath sounds normal. He has no wheezes.  GI: Soft. Bowel sounds are normal. There is no tenderness.  Musculoskeletal: Normal range of motion.       Uses walker and cane  Neurological: He is alert and oriented to person, place, and time.  Skin: Skin is warm and dry.     Assessment/Plan Hx Lymphoma PAC no longer needed For removal today Pt aware of procedure benefits and risks and agreeable to proceed. Consent signed.  Kortez Murtagh A 02/23/2012, 10:00 AM

## 2012-07-15 ENCOUNTER — Emergency Department (HOSPITAL_COMMUNITY): Payer: Medicare Other

## 2012-07-15 ENCOUNTER — Inpatient Hospital Stay (HOSPITAL_COMMUNITY)
Admission: EM | Admit: 2012-07-15 | Discharge: 2012-07-18 | DRG: 191 | Disposition: A | Discharge status: Home / Self Care | Payer: Medicare Other | Attending: Pulmonary Disease | Admitting: Pulmonary Disease

## 2012-07-15 ENCOUNTER — Encounter (HOSPITAL_COMMUNITY): Payer: Self-pay | Admitting: *Deleted

## 2012-07-15 DIAGNOSIS — I4821 Permanent atrial fibrillation: Secondary | ICD-10-CM | POA: Diagnosis present

## 2012-07-15 DIAGNOSIS — I951 Orthostatic hypotension: Secondary | ICD-10-CM | POA: Diagnosis present

## 2012-07-15 DIAGNOSIS — M51379 Other intervertebral disc degeneration, lumbosacral region without mention of lumbar back pain or lower extremity pain: Secondary | ICD-10-CM | POA: Diagnosis present

## 2012-07-15 DIAGNOSIS — M5137 Other intervertebral disc degeneration, lumbosacral region: Secondary | ICD-10-CM | POA: Diagnosis present

## 2012-07-15 DIAGNOSIS — G4733 Obstructive sleep apnea (adult) (pediatric): Secondary | ICD-10-CM | POA: Diagnosis present

## 2012-07-15 DIAGNOSIS — M8708 Idiopathic aseptic necrosis of bone, other site: Secondary | ICD-10-CM | POA: Diagnosis present

## 2012-07-15 DIAGNOSIS — E669 Obesity, unspecified: Secondary | ICD-10-CM | POA: Diagnosis present

## 2012-07-15 DIAGNOSIS — G8929 Other chronic pain: Secondary | ICD-10-CM | POA: Diagnosis present

## 2012-07-15 DIAGNOSIS — J441 Chronic obstructive pulmonary disease with (acute) exacerbation: Secondary | ICD-10-CM | POA: Diagnosis present

## 2012-07-15 DIAGNOSIS — H409 Unspecified glaucoma: Secondary | ICD-10-CM | POA: Diagnosis present

## 2012-07-15 DIAGNOSIS — Z6833 Body mass index (BMI) 33.0-33.9, adult: Secondary | ICD-10-CM

## 2012-07-15 DIAGNOSIS — E119 Type 2 diabetes mellitus without complications: Secondary | ICD-10-CM | POA: Diagnosis present

## 2012-07-15 DIAGNOSIS — Z7982 Long term (current) use of aspirin: Secondary | ICD-10-CM

## 2012-07-15 DIAGNOSIS — Z87898 Personal history of other specified conditions: Secondary | ICD-10-CM

## 2012-07-15 DIAGNOSIS — Z23 Encounter for immunization: Secondary | ICD-10-CM

## 2012-07-15 DIAGNOSIS — J449 Chronic obstructive pulmonary disease, unspecified: Secondary | ICD-10-CM | POA: Diagnosis present

## 2012-07-15 DIAGNOSIS — Z66 Do not resuscitate: Secondary | ICD-10-CM | POA: Diagnosis present

## 2012-07-15 DIAGNOSIS — Z806 Family history of leukemia: Secondary | ICD-10-CM

## 2012-07-15 DIAGNOSIS — K219 Gastro-esophageal reflux disease without esophagitis: Secondary | ICD-10-CM | POA: Diagnosis present

## 2012-07-15 DIAGNOSIS — F17200 Nicotine dependence, unspecified, uncomplicated: Secondary | ICD-10-CM | POA: Diagnosis present

## 2012-07-15 DIAGNOSIS — M5126 Other intervertebral disc displacement, lumbar region: Secondary | ICD-10-CM | POA: Diagnosis present

## 2012-07-15 DIAGNOSIS — I4891 Unspecified atrial fibrillation: Secondary | ICD-10-CM

## 2012-07-15 DIAGNOSIS — C8589 Other specified types of non-Hodgkin lymphoma, extranodal and solid organ sites: Secondary | ICD-10-CM | POA: Diagnosis present

## 2012-07-15 DIAGNOSIS — Z8249 Family history of ischemic heart disease and other diseases of the circulatory system: Secondary | ICD-10-CM

## 2012-07-15 DIAGNOSIS — I700 Atherosclerosis of aorta: Secondary | ICD-10-CM | POA: Diagnosis present

## 2012-07-15 DIAGNOSIS — D72829 Elevated white blood cell count, unspecified: Secondary | ICD-10-CM | POA: Diagnosis present

## 2012-07-15 LAB — CBC WITH DIFFERENTIAL/PLATELET
Basophils Relative: 0 % (ref 0–1)
Eosinophils Absolute: 0.2 10*3/uL (ref 0.0–0.7)
Eosinophils Relative: 2 % (ref 0–5)
HCT: 44 % (ref 39.0–52.0)
Hemoglobin: 14.8 g/dL (ref 13.0–17.0)
Lymphs Abs: 2.4 10*3/uL (ref 0.7–4.0)
MCH: 31.2 pg (ref 26.0–34.0)
MCHC: 33.6 g/dL (ref 30.0–36.0)
MCV: 92.8 fL (ref 78.0–100.0)
Monocytes Absolute: 0.9 10*3/uL (ref 0.1–1.0)
Monocytes Relative: 8 % (ref 3–12)
Neutrophils Relative %: 70 % (ref 43–77)

## 2012-07-15 LAB — BASIC METABOLIC PANEL
BUN: 21 mg/dL (ref 6–23)
Calcium: 10.2 mg/dL (ref 8.4–10.5)
Creatinine, Ser: 1.15 mg/dL (ref 0.50–1.35)
GFR calc Af Amer: 75 mL/min — ABNORMAL LOW (ref >90)
GFR calc non Af Amer: 65 mL/min — ABNORMAL LOW (ref >90)
Glucose, Bld: 96 mg/dL (ref 70–99)

## 2012-07-15 MED ORDER — INSULIN ASPART 100 UNIT/ML ~~LOC~~ SOLN
0.0000 [IU] | Freq: Every day | SUBCUTANEOUS | Status: DC
  Administered 2012-07-16 – 2012-07-17 (×2): 0 [IU] via SUBCUTANEOUS

## 2012-07-15 MED ORDER — METFORMIN HCL 500 MG PO TABS
500.0000 mg | ORAL_TABLET | Freq: Two times a day (BID) | ORAL | Status: DC
  Administered 2012-07-16 – 2012-07-18 (×5): 500 mg via ORAL
  Filled 2012-07-15 (×5): qty 1

## 2012-07-15 MED ORDER — DILTIAZEM HCL 100 MG IV SOLR
5.0000 mg/h | INTRAVENOUS | Status: DC
  Administered 2012-07-15: 5 mg/h via INTRAVENOUS
  Filled 2012-07-15: qty 100

## 2012-07-15 MED ORDER — ALBUTEROL SULFATE (5 MG/ML) 0.5% IN NEBU
5.0000 mg | INHALATION_SOLUTION | Freq: Once | RESPIRATORY_TRACT | Status: AC
  Administered 2012-07-15: 5 mg via RESPIRATORY_TRACT
  Filled 2012-07-15: qty 1

## 2012-07-15 MED ORDER — SODIUM CHLORIDE 0.9 % IV SOLN
INTRAVENOUS | Status: DC
  Administered 2012-07-15 – 2012-07-17 (×3): via INTRAVENOUS

## 2012-07-15 MED ORDER — LEVALBUTEROL HCL 1.25 MG/0.5ML IN NEBU
1.2500 mg | INHALATION_SOLUTION | Freq: Four times a day (QID) | RESPIRATORY_TRACT | Status: DC
  Administered 2012-07-16 – 2012-07-17 (×4): 1.25 mg via RESPIRATORY_TRACT
  Filled 2012-07-15 (×5): qty 0.5

## 2012-07-15 MED ORDER — ACETAMINOPHEN 325 MG PO TABS
650.0000 mg | ORAL_TABLET | Freq: Four times a day (QID) | ORAL | Status: DC | PRN
  Administered 2012-07-17: 650 mg via ORAL

## 2012-07-15 MED ORDER — ONDANSETRON HCL 4 MG/2ML IJ SOLN
4.0000 mg | Freq: Once | INTRAMUSCULAR | Status: AC
  Administered 2012-07-15: 4 mg via INTRAVENOUS
  Filled 2012-07-15: qty 2

## 2012-07-15 MED ORDER — DEXTROSE 5 % IV SOLN
1.0000 g | INTRAVENOUS | Status: DC
  Administered 2012-07-16 – 2012-07-17 (×3): 1 g via INTRAVENOUS
  Filled 2012-07-15 (×3): qty 10

## 2012-07-15 MED ORDER — ALUM & MAG HYDROXIDE-SIMETH 200-200-20 MG/5ML PO SUSP
30.0000 mL | Freq: Four times a day (QID) | ORAL | Status: DC | PRN
  Administered 2012-07-17: 30 mL via ORAL
  Filled 2012-07-15: qty 30

## 2012-07-15 MED ORDER — ENOXAPARIN SODIUM 40 MG/0.4ML ~~LOC~~ SOLN
40.0000 mg | SUBCUTANEOUS | Status: DC
  Administered 2012-07-16 – 2012-07-17 (×3): 40 mg via SUBCUTANEOUS
  Filled 2012-07-15 (×3): qty 0.4

## 2012-07-15 MED ORDER — DEXTROSE 5 % IV SOLN
INTRAVENOUS | Status: AC
  Filled 2012-07-15: qty 500

## 2012-07-15 MED ORDER — DILTIAZEM LOAD VIA INFUSION
10.0000 mg | Freq: Once | INTRAVENOUS | Status: AC
  Administered 2012-07-15: 10 mg via INTRAVENOUS
  Filled 2012-07-15: qty 10

## 2012-07-15 MED ORDER — INSULIN ASPART 100 UNIT/ML ~~LOC~~ SOLN
0.0000 [IU] | Freq: Three times a day (TID) | SUBCUTANEOUS | Status: DC
  Administered 2012-07-16: 3 [IU] via SUBCUTANEOUS
  Administered 2012-07-16: 2 [IU] via SUBCUTANEOUS
  Administered 2012-07-16: 3 [IU] via SUBCUTANEOUS
  Administered 2012-07-17 – 2012-07-18 (×4): 2 [IU] via SUBCUTANEOUS

## 2012-07-15 MED ORDER — IPRATROPIUM BROMIDE 0.02 % IN SOLN
0.5000 mg | Freq: Once | RESPIRATORY_TRACT | Status: AC
  Administered 2012-07-15: 0.5 mg via RESPIRATORY_TRACT
  Filled 2012-07-15 (×2): qty 2.5

## 2012-07-15 MED ORDER — LIDOCAINE 5 % EX PTCH
1.0000 | MEDICATED_PATCH | Freq: Every day | CUTANEOUS | Status: DC | PRN
  Filled 2012-07-15: qty 1

## 2012-07-15 MED ORDER — DEXTROSE 5 % IV SOLN
500.0000 mg | INTRAVENOUS | Status: DC
  Administered 2012-07-16 – 2012-07-17 (×3): 500 mg via INTRAVENOUS
  Filled 2012-07-15 (×3): qty 500

## 2012-07-15 MED ORDER — CEFTRIAXONE SODIUM 1 G IJ SOLR
INTRAMUSCULAR | Status: AC
  Filled 2012-07-15: qty 10

## 2012-07-15 MED ORDER — SODIUM CHLORIDE 0.9 % IJ SOLN
3.0000 mL | Freq: Two times a day (BID) | INTRAMUSCULAR | Status: DC
  Administered 2012-07-16 – 2012-07-17 (×2): 3 mL via INTRAVENOUS
  Filled 2012-07-15 (×2): qty 3

## 2012-07-15 MED ORDER — ACETAMINOPHEN 650 MG RE SUPP
650.0000 mg | Freq: Four times a day (QID) | RECTAL | Status: DC | PRN
  Filled 2012-07-15: qty 2

## 2012-07-15 MED ORDER — ONDANSETRON HCL 4 MG PO TABS: 4.0000 mg | ORAL_TABLET | Freq: Four times a day (QID) | ORAL | Status: DC | PRN

## 2012-07-15 MED ORDER — ONDANSETRON HCL 4 MG/2ML IJ SOLN: 4.0000 mg | Freq: Four times a day (QID) | INTRAMUSCULAR | Status: DC | PRN

## 2012-07-15 MED ORDER — MORPHINE SULFATE 15 MG PO TABS
15.0000 mg | ORAL_TABLET | Freq: Four times a day (QID) | ORAL | Status: DC | PRN
  Administered 2012-07-16 – 2012-07-17 (×6): 15 mg via ORAL
  Filled 2012-07-15 (×6): qty 1

## 2012-07-15 MED ORDER — METHYLPREDNISOLONE SODIUM SUCC 125 MG IJ SOLR
125.0000 mg | Freq: Once | INTRAMUSCULAR | Status: AC
  Administered 2012-07-15: 125 mg via INTRAVENOUS
  Filled 2012-07-15: qty 2

## 2012-07-15 MED ORDER — METHYLPREDNISOLONE SODIUM SUCC 125 MG IJ SOLR
125.0000 mg | Freq: Four times a day (QID) | INTRAMUSCULAR | Status: DC
  Administered 2012-07-16 – 2012-07-18 (×10): 125 mg via INTRAVENOUS
  Filled 2012-07-15 (×10): qty 2

## 2012-07-15 MED ORDER — ASPIRIN 325 MG PO TABS
325.0000 mg | ORAL_TABLET | Freq: Every day | ORAL | Status: DC
  Administered 2012-07-16 – 2012-07-18 (×3): 325 mg via ORAL
  Filled 2012-07-15 (×3): qty 1

## 2012-07-15 NOTE — ED Provider Notes (Signed)
History     CSN: [id number]  Arrival date & time 07/15/12  1827   First MD Initiated Contact with Patient 07/15/12 1852      Chief Complaint  Patient presents with  . Fever     Patient is a 66 y.o. male presenting with fever.  Fever Primary symptoms of the febrile illness include fever, cough, wheezing, shortness of breath, nausea and vomiting. The current episode started 3 to 5 days ago. This is a new problem. The problem has been gradually worsening.  coughing worsens symptoms Nothing improves his symptoms   Pt reports at least 3 days of cough, post tussive emesis, shortness of breath, wheezing, and chest wall pain He reports chest wall pain from cough He reports HA with cough as well He reports his cough is productive of white sputum  Past Medical History  Diagnosis Date  . Atrial fibrillation   . COPD (chronic obstructive pulmonary disease)     mild PFT abnormalities; normal ABG  . Tobacco abuse     45 pack years  . Orthostatic hypotension     lightheadness; no definate loss of consciousness  . Splenic abscess 07/2009  . Diabetes mellitus     type 2  . Obesity   . Pedal edema   . Glaucoma   . Obstructive sleep apnea     treated with CPAP  . GERD (gastroesophageal reflux disease)   . Degenerative disc disease, lumbar     HNP of the LS spine  . Colonic polyp   . Avascular necrosis of bones of both hips   . Atherosclerosis of abdominal aorta     asymptomatic; DX by CT scan  . Hx MRSA infection 2010    sith splenic abcess  . Non Hodgkin's lymphoma 06/2009    chemotherapy until 11/2010  . Cancer     Past Surgical History  Procedure Date  . Abscess drainage 2010    Splenic  . Colonoscopy w/ polypectomy 2008    with snare polypectomy  . Cataract extraction     Right with lens implant  . Lymph node biopsy 2010    done x 2 for NHL diagnosis  . Tonsillectomy     Family History  Problem Relation Age of Onset  . Heart failure Mother 60    results of chf    . Leukemia Father   . Colon cancer Sister   . Heart disease Brother   . Heart disease Brother   . Heart disease Brother   . Heart disease Brother     History  Substance Use Topics  . Smoking status: Current Everyday Smoker -- 1.0 packs/day for 40 years    Types: Cigarettes  . Smokeless tobacco: Not on file  . Alcohol Use: No      Review of Systems  Constitutional: Positive for fever.  Respiratory: Positive for cough, shortness of breath and wheezing.   Gastrointestinal: Positive for nausea and vomiting.  All other systems reviewed and are negative.    Allergies  Codeine and Prednisolone acetate  Home Medications   Current Outpatient Rx  Name Route Sig Dispense Refill  . ALBUTEROL SULFATE 0.63 MG/3ML IN NEBU Nebulization Take 1 ampule by nebulization every 6 (six) hours as needed. Wheezing    . ALBUTEROL SULFATE HFA 108 (90 BASE) MCG/ACT IN AERS Inhalation Inhale 2 puffs into the lungs every 6 (six) hours as needed. Wheezing    . ASPIRIN 325 MG PO TABS Oral Take 325 mg by mouth daily.    Marland Kitchen  LIDOCAINE 5 % EX PTCH Transdermal Place 1 patch onto the skin daily as needed. Remove & Discard patch within 12 hours or as directed by MD. Pain    . METFORMIN HCL 500 MG PO TABS Oral Take 500 mg by mouth 2 (two) times daily.    . MORPHINE SULFATE 15 MG PO TABS Oral Take 15 mg by mouth 4 (four) times daily as needed.    . SENNOSIDES 8.6 MG PO TABS Oral Take 1 tablet by mouth daily as needed. Constipation    . SALINE NASAL SPRAY 0.65 % NA SOLN Nasal Place 1 spray into the nose as needed. Allergies      BP 128/75  Pulse 118  Temp 99.9 F (37.7 C) (Oral)  Resp 22  Ht 6\' 2"  (1.88 m)  Wt 200 lb (90.719 kg)  BMI 25.68 kg/m2  SpO2 99% BP 115/73  Pulse 123  Temp 99.9 F (37.7 C) (Oral)  Resp 22  Ht 6\' 2"  (1.88 m)  Wt 200 lb (90.719 kg)  BMI 25.68 kg/m2  SpO2 97%   Physical Exam CONSTITUTIONAL: Well developed/well nourished HEAD AND FACE: Normocephalic/atraumatic EYES:  EOMI/PERRL ENMT: Mucous membranes moist, nasal congestion noted NECK: supple no meningeal signs SPINE:entire spine nontender CV: irregular, no loud murmurs noted, tachycardia noted LUNGS: coarse wheeze noted bilaterally.  Tachypnea noted.   ABDOMEN: soft, nontender, no rebound or guarding GU:no cva tenderness NEURO: Pt is awake/alert, moves all extremitiesx4 EXTREMITIES: pulses normal, full ROM SKIN: warm, color normal PSYCH: no abnormalities of mood noted   ED Course  Procedures    Labs Reviewed  CBC WITH DIFFERENTIAL  BASIC METABOLIC PANEL   Dg Chest 2 View  07/15/2012  *RADIOLOGY REPORT*  Clinical Data: Fever.  Cough  CHEST - 2 VIEW  Comparison: 11/14/2011  Findings:  Negative for pneumonia or effusion.  Negative for heart failure.  No mass lesion.  IMPRESSION: No active cardiopulmonary disease.  Original Report Authenticated By: Camelia Phenes, M.D.   7:30 PM Pt with cough/wheeze, will start nebs and steroids.  He is having paroxysms of cough with post tussive emesis.  He is also having intermittent episodes of afib with rvr.  Will follow closely  8:50 PM Pt with some improvement in breathing but still with coarse wheezing.  Still tachycardic with afib Will admit, d/w dr fagan who is on for dr [name]     MDM  Nursing notes including past medical history and social history reviewed and considered in documentation All labs/vitals reviewed and considered xrays reviewed and considered Previous records reviewed and considered - h/o afib per cardiology noted, h/o lymphoma but denies current chemotherapy        Date: 07/15/2012  Rate: 119  Rhythm: atrial fibrillation  QRS Axis: normal  Intervals: normal  ST/T Wave abnormalities: nonspecific ST changes  Conduction Disutrbances:none  Narrative Interpretation:   Old EKG Reviewed: changes noted    Joya Gaskins, MD 07/15/12 2050

## 2012-07-15 NOTE — ED Notes (Signed)
Chest hurts, cough, headache. X 3 day.  White mucus.

## 2012-07-16 ENCOUNTER — Encounter (HOSPITAL_COMMUNITY): Payer: Self-pay | Admitting: *Deleted

## 2012-07-16 LAB — BASIC METABOLIC PANEL
CO2: 24 mEq/L (ref 19–32)
Calcium: 10 mg/dL (ref 8.4–10.5)
Creatinine, Ser: 1.05 mg/dL (ref 0.50–1.35)
Glucose, Bld: 163 mg/dL — ABNORMAL HIGH (ref 70–99)

## 2012-07-16 LAB — GLUCOSE, CAPILLARY: Glucose-Capillary: 141 mg/dL — ABNORMAL HIGH (ref 70–99)

## 2012-07-16 MED ORDER — PNEUMOCOCCAL VAC POLYVALENT 25 MCG/0.5ML IJ INJ
0.5000 mL | INJECTION | INTRAMUSCULAR | Status: AC
  Administered 2012-07-17: 0.5 mL via INTRAMUSCULAR
  Filled 2012-07-16: qty 0.5

## 2012-07-16 MED ORDER — GUAIFENESIN ER 600 MG PO TB12
1200.0000 mg | ORAL_TABLET | Freq: Two times a day (BID) | ORAL | Status: DC
  Administered 2012-07-16 – 2012-07-18 (×4): 1200 mg via ORAL
  Filled 2012-07-16 (×3): qty 2
  Filled 2012-07-16: qty 1

## 2012-07-16 MED ORDER — DILTIAZEM HCL 60 MG PO TABS
60.0000 mg | ORAL_TABLET | Freq: Four times a day (QID) | ORAL | Status: DC
  Administered 2012-07-16 – 2012-07-18 (×9): 60 mg via ORAL
  Filled 2012-07-16 (×9): qty 1

## 2012-07-16 NOTE — Care Management Note (Unsigned)
    Page 1 of 1   07/16/2012     3:27:23 PM   CARE MANAGEMENT NOTE 07/16/2012  Patient:  [name], [name]   Account Number:  [id number]  Date Initiated:  07/16/2012  Documentation initiated by:  Sharrie Rothman  Subjective/Objective Assessment:   Pt admitted from home with A fib. Pt lives alone and will return home at discharge. Pt has family and a daughter that checks on pt daily. Pt does have a cane, wheelchair, walker, and hospital bed for home use.     Action/Plan:   No CM needs at this time. Will continue to monitor.   Anticipated DC Date:  07/18/2012   Anticipated DC Plan:  HOME/SELF CARE      DC Planning Services  CM consult      Choice offered to / List presented to:             Status of service:  In process, will continue to follow Medicare Important Message given?   (If response is "NO", the following Medicare IM given date fields will be blank) Date Medicare IM given:   Date Additional Medicare IM given:    Discharge Disposition:    Per UR Regulation:    If discussed at Long Length of Stay Meetings, dates discussed:    Comments:  07/16/12 1526 Arlyss Queen, RN BSN CM

## 2012-07-16 NOTE — Progress Notes (Signed)
Subjective: He was admitted last night with COPD exacerbation and rapid atrial fibrillation. He says he feels a little bit better. In having fever at home. He does have a history of a non-Hodgkin's lymphoma but his last checks on that have been okay  Objective: Vital signs in last 24 hours: Temp:  [97.6 F (36.4 C)-99.9 F (37.7 C)] 97.7 F (36.5 C) (07/23 0400) Pulse Rate:  [62-137] 62  (07/23 0800) Resp:  [18-22] 20  (07/23 0400) BP: (97-128)/(43-75) 109/66 mmHg (07/23 0400) SpO2:  [92 %-99 %] 94 % (07/23 0751) FiO2 (%):  [28 %] 28 % (07/22 2106) Weight:  [90.719 kg (200 lb)-91 kg (200 lb 9.9 oz)] 91 kg (200 lb 9.9 oz) (07/23 0500) Weight change:  Last BM Date: 07/13/12  Intake/Output from previous day: 07/22 0701 - 07/23 0700 In: 733.4 [I.V.:433.4; IV Piggyback:300] Out: -   PHYSICAL EXAM General appearance: alert, cooperative and mild distress Resp: rhonchi bilaterally and wheezes bilaterally Cardio: irregularly irregular rhythm GI: soft, non-tender; bowel sounds normal; no masses,  no organomegaly Extremities: extremities normal, atraumatic, no cyanosis or edema  Lab Results:    Basic Metabolic Panel:  Basename 07/16/12 0755 07/15/12 1937  NA 139 134*  K 4.1 3.7  CL 104 96  CO2 24 26  GLUCOSE 163* 96  BUN 25* 21  CREATININE 1.05 1.15  CALCIUM 10.0 10.2  MG -- --  PHOS -- --   Liver Function Tests: No results found for this basename: AST:2,ALT:2,ALKPHOS:2,BILITOT:2,PROT:2,ALBUMIN:2 in the last 72 hours No results found for this basename: LIPASE:2,AMYLASE:2 in the last 72 hours No results found for this basename: AMMONIA:2 in the last 72 hours CBC:  Basename 07/15/12 1937  WBC 11.8*  NEUTROABS 8.3*  HGB 14.8  HCT 44.0  MCV 92.8  PLT 212   Cardiac Enzymes: No results found for this basename: CKTOTAL:3,CKMB:3,CKMBINDEX:3,TROPONINI:3 in the last 72 hours BNP: No results found for this basename: PROBNP:3 in the last 72 hours D-Dimer: No results found  for this basename: DDIMER:2 in the last 72 hours CBG:  Basename 07/16/12 0736 07/15/12 2203  GLUCAP 159* 186*   Hemoglobin A1C: No results found for this basename: HGBA1C in the last 72 hours Fasting Lipid Panel: No results found for this basename: CHOL,HDL,LDLCALC,TRIG,CHOLHDL,LDLDIRECT in the last 72 hours Thyroid Function Tests: No results found for this basename: TSH,T4TOTAL,FREET4,T3FREE,THYROIDAB in the last 72 hours Anemia Panel: No results found for this basename: VITAMINB12,FOLATE,FERRITIN,TIBC,IRON,RETICCTPCT in the last 72 hours Coagulation: No results found for this basename: LABPROT:2,INR:2 in the last 72 hours Urine Drug Screen: Drugs of Abuse  No results found for this basename: labopia, cocainscrnur, labbenz, amphetmu, thcu, labbarb    Alcohol Level: No results found for this basename: ETH:2 in the last 72 hours Urinalysis: No results found for this basename: COLORURINE:2,APPERANCEUR:2,LABSPEC:2,PHURINE:2,GLUCOSEU:2,HGBUR:2,BILIRUBINUR:2,KETONESUR:2,PROTEINUR:2,UROBILINOGEN:2,NITRITE:2,LEUKOCYTESUR:2 in the last 72 hours Misc. Labs:  ABGS No results found for this basename: PHART,PCO2,PO2ART,TCO2,HCO3 in the last 72 hours CULTURES Recent Results (from the past 240 hour(s))  MRSA PCR SCREENING     Status: Normal   Collection Time   07/15/12 11:39 PM      Component Value Range Status Comment   MRSA by PCR NEGATIVE  NEGATIVE Final    Studies/Results: Dg Chest 2 View  07/15/2012  *RADIOLOGY REPORT*  Clinical Data: Fever.  Cough  CHEST - 2 VIEW  Comparison: 11/14/2011  Findings:  Negative for pneumonia or effusion.  Negative for heart failure.  No mass lesion.  IMPRESSION: No active cardiopulmonary disease.  Original Report Authenticated  By: Camelia Phenes, M.D.    Medications:  Prior to Admission:  Prescriptions prior to admission  Medication Sig Dispense Refill  . albuterol (ACCUNEB) 0.63 MG/3ML nebulizer solution Take 1 ampule by nebulization every 6 (six)  hours as needed. Wheezing      . albuterol (PROVENTIL HFA;VENTOLIN HFA) 108 (90 BASE) MCG/ACT inhaler Inhale 2 puffs into the lungs every 6 (six) hours as needed. Wheezing      . aspirin 325 MG tablet Take 325 mg by mouth daily.      Marland Kitchen lidocaine (LIDODERM) 5 % Place 1 patch onto the skin daily as needed. Remove & Discard patch within 12 hours or as directed by MD. Pain      . metFORMIN (GLUCOPHAGE) 500 MG tablet Take 500 mg by mouth 2 (two) times daily.      Marland Kitchen morphine (MSIR) 15 MG tablet Take 15 mg by mouth 4 (four) times daily as needed.      . senna (SENOKOT) 8.6 MG tablet Take 1 tablet by mouth daily as needed. Constipation      . sodium chloride (AYR) 0.65 % nasal spray Place 1 spray into the nose as needed. Allergies       Scheduled:   . albuterol  5 mg Nebulization Once  . albuterol  5 mg Nebulization Once  . aspirin  325 mg Oral Daily  . azithromycin  500 mg Intravenous Q24H  . cefTRIAXone (ROCEPHIN)  IV  1 g Intravenous Q24H  . diltiazem  10 mg Intravenous Once  . diltiazem  60 mg Oral Q6H  . enoxaparin (LOVENOX) injection  40 mg Subcutaneous Q24H  . insulin aspart  0-15 Units Subcutaneous TID WC  . insulin aspart  0-5 Units Subcutaneous QHS  . ipratropium  0.5 mg Nebulization Once  . levalbuterol  1.25 mg Nebulization Q6H  . metFORMIN  500 mg Oral BID WC  . methylPREDNISolone (SOLU-MEDROL) injection  125 mg Intravenous Once  . methylPREDNISolone (SOLU-MEDROL) injection  125 mg Intravenous Q6H  . ondansetron  4 mg Intravenous Once  . pneumococcal 23 valent vaccine  0.5 mL Intramuscular Tomorrow-1000  . sodium chloride  3 mL Intravenous Q12H   Continuous:   . sodium chloride 50 mL/hr at 07/16/12 0800  . DISCONTD: diltiazem (CARDIZEM) infusion Stopped (07/16/12 0700)   BMW:[id number], acetaminophen, alum & mag hydroxide-simeth, lidocaine, morphine, ondansetron (ZOFRAN) IV, ondansetron  Assesment: He is admitted with COPD exacerbation and rapid atrial fibrillation. He  has multiple other medical problems. He is improving but still very congested and still wheezing. His heart rate is better. Active Problems:  * No active hospital problems. *     Plan: I switched him from IV diltiazem to oral diltiazem. He will continue IV steroids antibiotics et Karie Soda.    LOS: 1 day   Ericah Scotto L 07/16/2012, 8:53 AM

## 2012-07-16 NOTE — Progress Notes (Signed)
UR Chart Review Completed  

## 2012-07-17 ENCOUNTER — Other Ambulatory Visit (HOSPITAL_COMMUNITY): Payer: Self-pay | Admitting: Oncology

## 2012-07-17 DIAGNOSIS — J4489 Other specified chronic obstructive pulmonary disease: Secondary | ICD-10-CM

## 2012-07-17 DIAGNOSIS — I4891 Unspecified atrial fibrillation: Secondary | ICD-10-CM

## 2012-07-17 DIAGNOSIS — J449 Chronic obstructive pulmonary disease, unspecified: Secondary | ICD-10-CM

## 2012-07-17 DIAGNOSIS — C8589 Other specified types of non-Hodgkin lymphoma, extranodal and solid organ sites: Secondary | ICD-10-CM

## 2012-07-17 LAB — GLUCOSE, CAPILLARY: Glucose-Capillary: 156 mg/dL — ABNORMAL HIGH (ref 70–99)

## 2012-07-17 LAB — URINE CULTURE

## 2012-07-17 MED ORDER — SODIUM CHLORIDE 0.9 % IN NEBU
INHALATION_SOLUTION | RESPIRATORY_TRACT | Status: AC
  Filled 2012-07-17: qty 3

## 2012-07-17 MED ORDER — TRAVOPROST (BAK FREE) 0.004 % OP SOLN
1.0000 [drp] | Freq: Every day | OPHTHALMIC | Status: DC
  Administered 2012-07-17: 1 [drp] via OPHTHALMIC
  Filled 2012-07-17: qty 2.5

## 2012-07-17 MED ORDER — LEVALBUTEROL HCL 1.25 MG/0.5ML IN NEBU
1.2500 mg | INHALATION_SOLUTION | Freq: Four times a day (QID) | RESPIRATORY_TRACT | Status: DC
  Administered 2012-07-17 – 2012-07-18 (×5): 1.25 mg via RESPIRATORY_TRACT
  Filled 2012-07-17 (×4): qty 0.5

## 2012-07-17 MED ORDER — LEVALBUTEROL HCL 1.25 MG/0.5ML IN NEBU: 1.2500 mg | INHALATION_SOLUTION | Freq: Four times a day (QID) | RESPIRATORY_TRACT | Status: DC

## 2012-07-17 NOTE — Consult Note (Signed)
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 He is status post 7 cycles of R. CHOP with Neulasta support. Most recent PET scan does not show any recurrence of disease, but there is a questionable prevascular lymph node is 5 mm with questionable uptake and needs a repeat PET scan in 6 months which is scheduled for next month.  Unfortunately, he presented to the emergency department with a two-day history of increasing shortness of breath. He also noted be in rapid atrial for relation. He was therefore admitted with a COPD exacerbation and rapid atrial fibrillation. Both of these have been stabilized and his breathing has improved. He is anticipated for discharge tomorrow. We were consult on this patient for consideration of laboratory work required for future observation and followup.  The patient reports he is feeling much better and he is anticipated to discharge tomorrow. Due to financial difficulties, he wonders if we can combine appointments to save him some gas money. The patient is agreeable to have his PET scan which is scheduled for 08/19/2012 at 9:30 AM. He was scheduled 2 days prior for laboratory work in preparation for his appointment with Dr. Mariel Sleet for followup. This concerns him that he'll have to make an initial trip to [facility] for laboratory work followed 2 days later with each her to [facility] for PET scan followed by a followup appointment 2 days later with Dr. Mariel Sleet. Therefore, we'll make a compromise. Unfortunately do to his recent acute illness, we will not be able to  perform laboratory work while he is admitted. We would like for him to have a four-week respite from the hospital for laboratory work is performed that may require [place] to act upon. So we've compromised with him having his laboratory Department the same day he is to see Dr. Mariel Sleet following the PET scan. He is aware that all laboratory work performed that day will not be back in time for a discussion.  I explained to the patient that we prefer to do laboratory work before seeing him so we can have a meaningful conversation regarding the laboratory work.  But, in this circumstance, we will perform laboratory work the day of his appointment following his PET scan. He is agreeable to this plan.  PAST MEDICAL HISTORY:   Past Medical History  Diagnosis Date  . Atrial fibrillation   . COPD (chronic obstructive pulmonary disease)     mild PFT abnormalities; normal ABG  . Tobacco abuse     45 pack years  . Orthostatic hypotension     lightheadness; no definate loss of consciousness  . Splenic abscess 07/2009  . Diabetes mellitus     type 2  . Obesity   . Pedal edema   . Glaucoma   . Obstructive sleep apnea     treated with CPAP  . GERD (gastroesophageal reflux disease)   . Degenerative disc disease, lumbar     HNP of the LS spine  . Colonic polyp   . Avascular necrosis of bones of both hips   . Atherosclerosis of abdominal aorta  asymptomatic; DX by CT scan  . Hx MRSA infection 2010    sith splenic abcess  . Non Hodgkin's lymphoma 06/2009    chemotherapy until 11/2010  . Cancer     ALLERGIES: Allergies  Allergen Reactions  . Codeine Other (See Comments)    REACTION: Shakes   . Prednisolone Acetate Other (See Comments)    redness      MEDICATIONS: I have reviewed the patient's current medications.     PAST SURGICAL HISTORY Past Surgical History  Procedure Date  . Abscess drainage 2010    Splenic  . Colonoscopy w/ polypectomy 2008    with snare polypectomy  . Cataract  extraction     Right with lens implant  . Lymph node biopsy 2010    done x 2 for NHL diagnosis  . Tonsillectomy     FAMILY HISTORY: Family History  Problem Relation Age of Onset  . Heart failure Mother 75    results of chf  . Leukemia Father   . Colon cancer Sister   . Heart disease Brother   . Heart disease Brother   . Heart disease Brother   . Heart disease Brother     SOCIAL HISTORY:  reports that he has been smoking Cigarettes.  He has a 40 pack-year smoking history. He does not have any smokeless tobacco history on file. He reports that he does not drink alcohol or use illicit drugs.  PERFORMANCE STATUS: The patient's performance status is 2 - Symptomatic, <50% confined to bed  PHYSICAL EXAM: Most Recent Vital Signs: Blood pressure 114/56, pulse 84, temperature 97.1 F (36.2 C), temperature source Oral, resp. rate 20, height 6\' 2"  (1.88 m), weight 250 lb 7.1 oz (113.6 kg), SpO2 98.00%. General appearance: alert, cooperative, appears stated age, no distress and nasal cannula in place Head: Normocephalic, without obvious abnormality, atraumatic Eyes: negative Neurologic: Grossly normal  LABORATORY DATA:  Results for orders placed during the hospital encounter of 07/15/12 (from the past 48 hour(s))  CBC WITH DIFFERENTIAL     Status: Abnormal   Collection Time   07/15/12  7:37 PM      Component Value Range Comment   WBC 11.8 (*) 4.0 - 10.5 K/uL    RBC 4.74  4.22 - 5.81 MIL/uL    Hemoglobin 14.8  13.0 - 17.0 g/dL    HCT 16.1  09.6 - 04.5 %    MCV 92.8  78.0 - 100.0 fL    MCH 31.2  26.0 - 34.0 pg    MCHC 33.6  30.0 - 36.0 g/dL    RDW 40.9  81.1 - 91.4 %    Platelets 212  150 - 400 K/uL    Neutrophils Relative 70  43 - 77 %    Neutro Abs 8.3 (*) 1.7 - 7.7 K/uL    Lymphocytes Relative 20  12 - 46 %    Lymphs Abs 2.4  0.7 - 4.0 K/uL    Monocytes Relative 8  3 - 12 %    Monocytes Absolute 0.9  0.1 - 1.0 K/uL    Eosinophils Relative 2  0 - 5 %    Eosinophils Absolute  0.2  0.0 - 0.7 K/uL    Basophils Relative 0  0 - 1 %    Basophils Absolute 0.0  0.0 - 0.1 K/uL   BASIC METABOLIC PANEL     Status: Abnormal   Collection Time   07/15/12  7:37 PM      Component Value Range  Comment   Sodium 134 (*) 135 - 145 mEq/L    Potassium 3.7  3.5 - 5.1 mEq/L    Chloride 96  96 - 112 mEq/L    CO2 26  19 - 32 mEq/L    Glucose, Bld 96  70 - 99 mg/dL    BUN 21  6 - 23 mg/dL    Creatinine, Ser 3.47  0.50 - 1.35 mg/dL    Calcium 42.5  8.4 - 10.5 mg/dL    GFR calc non Af Amer 65 (*) >90 mL/min    GFR calc Af Amer 75 (*) >90 mL/min   GLUCOSE, CAPILLARY     Status: Abnormal   Collection Time   07/15/12 10:03 PM      Component Value Range Comment   Glucose-Capillary 186 (*) 70 - 99 mg/dL   URINE CULTURE     Status: Normal   Collection Time   07/15/12 10:06 PM      Component Value Range Comment   Specimen Description URINE, CLEAN CATCH      Special Requests NONE      Culture  Setup Time 07/15/2012 23:16      Colony Count NO GROWTH      Culture NO GROWTH      Report Status 07/17/2012 FINAL     MRSA PCR SCREENING     Status: Normal   Collection Time   07/15/12 11:39 PM      Component Value Range Comment   MRSA by PCR NEGATIVE  NEGATIVE   GLUCOSE, CAPILLARY     Status: Abnormal   Collection Time   07/16/12  7:36 AM      Component Value Range Comment   Glucose-Capillary 159 (*) 70 - 99 mg/dL    Comment 1 Notify RN      Comment 2 Documented in Chart     BASIC METABOLIC PANEL     Status: Abnormal   Collection Time   07/16/12  7:55 AM      Component Value Range Comment   Sodium 139  135 - 145 mEq/L    Potassium 4.1  3.5 - 5.1 mEq/L    Chloride 104  96 - 112 mEq/L DELTA CHECK NOTED   CO2 24  19 - 32 mEq/L    Glucose, Bld 163 (*) 70 - 99 mg/dL    BUN 25 (*) 6 - 23 mg/dL    Creatinine, Ser 9.56  0.50 - 1.35 mg/dL    Calcium 38.7  8.4 - 10.5 mg/dL    GFR calc non Af Amer 73 (*) >90 mL/min    GFR calc Af Amer 84 (*) >90 mL/min   GLUCOSE, CAPILLARY     Status:  Abnormal   Collection Time   07/16/12 11:58 AM      Component Value Range Comment   Glucose-Capillary 141 (*) 70 - 99 mg/dL    Comment 1 Notify RN      Comment 2 Documented in Chart     GLUCOSE, CAPILLARY     Status: Abnormal   Collection Time   07/16/12  4:38 PM      Component Value Range Comment   Glucose-Capillary 178 (*) 70 - 99 mg/dL    Comment 1 Documented in Chart      Comment 2 Notify RN     GLUCOSE, CAPILLARY     Status: Abnormal   Collection Time   07/16/12  9:37 PM      Component Value Range Comment   Glucose-Capillary  156 (*) 70 - 99 mg/dL    Comment 1 Documented in Chart      Comment 2 Notify RN     GLUCOSE, CAPILLARY     Status: Abnormal   Collection Time   07/17/12  8:21 AM      Component Value Range Comment   Glucose-Capillary 156 (*) 70 - 99 mg/dL    Comment 1 Notify RN      Comment 2 Documented in Chart         RADIOGRAPHY: Dg Chest 2 View  07/15/2012  *RADIOLOGY REPORT*  Clinical Data: Fever.  Cough  CHEST - 2 VIEW  Comparison: 11/14/2011  Findings:  Negative for pneumonia or effusion.  Negative for heart failure.  No mass lesion.  IMPRESSION: No active cardiopulmonary disease.  Original Report Authenticated By: Camelia Phenes, M.D.    ASSESSMENT:  1. Stage IIAES diffuse large B-cell lymphoma, established on 08/20/2009 via splenic biopsy and pancreatic tail mass biopsy, status post 7 cycles of R-CHOP with Neulasta support. His PET scan in 2012 showed no evidence of disease. His PET scan now shows no evidence of recurrent disease, but there is a questionable prevascular lymph node that is 5 mm that is present with questionable uptake and needs a repeat test in 6 months.  This will be performed next month. 2. COPD 3. A-fib  PLAN:  1. Lab work per attending 2. PET scan as scheduled on 08/19/2012 at 9:30 3. Will cancel 08/17/2012 lab appointment at the [facility]. 4. Patient will return as scheduled for follow-up following PET scan on 08/21/2012 to  see Dr. Mariel Sleet.  Lab work will be performed the day of this follow-up appointment. Message sent to [facility] scheduler regarding this change.   All questions were answered. The patient knows to call the clinic with any problems, questions or concerns. We can certainly see the patient much sooner if necessary.  The patient and plan discussed with Glenford Peers, MD and he is in agreement with the aforementioned.  Caili Escalera

## 2012-07-17 NOTE — H&P (Signed)
[name], [name]                  ACCOUNT NO.:  [id number]  MEDICAL RECORD NO.:  [id number]  LOCATION:                                 FACILITY:  PHYSICIAN:  Kingsley Callander. Ouida Sills, MD       DATE OF BIRTH:  09-26-1946  DATE OF ADMISSION:  07/15/2012 DATE OF DISCHARGE:  LH                             HISTORY & PHYSICAL   CHIEF COMPLAINT:  Difficulty breathing.  HISTORY OF PRESENT ILLNESS:  This patient is a 66 year old white male patient of Dr. [name] who presented to the emergency room with a 2-day history of increasing difficulty breathing.  He has been having shortness of breath, coughing, and wheezing.  He uses home oxygen.  He uses home nebulizers.  He has had fevers and chills.  He has coughed up primarily white mucus.  He denies hemoptysis.  On evaluation in the emergency room, he was found to be in atrial fibrillation with a rapid ventricular response in the 130s.  He was treated with nebulizer treatments but failed to clear with his wheezing and was thus felt to require inpatient treatment.  PAST MEDICAL HISTORY: 1. COPD. 2. Non-Hodgkin's lymphoma. 3. Chronic atrial fibrillation. 4. History of splenic abscess. 5. Type 2 diabetes. 6. Glaucoma. 7. Sleep apnea. 8. GERD. 9. DJD. 10.Avascular necrosis of the hips.  MEDICATIONS: 1. Albuterol nebs p.r.n. 2. Aspirin 325 mg daily. 3. Lidoderm patches p.r.n. 4. Metformin 500 mg b.i.d. 5. MSIR 15 mg q.i.d. p.r.n. 6. Senokot p.r.n. 7. Saline nasal spray p.r.n.  ALLERGIES:  Codeine and prednisolone acetate.  SOCIAL HISTORY:  He continues to smoke.  He does not drink or use drugs.  FAMILY HISTORY:  His mother had congestive heart failure.  His father had leukemia.  His sisters had colon cancer.  Four brothers had heart disease.  REVIEW OF SYSTEMS:  Noncontributory.  PHYSICAL EXAMINATION:  VITAL SIGNS:  Temperature 99.9, pulse 137, respirations 22, blood pressure 128/75. GENERAL:  Alert and dyspneic. HEENT:  Eyes, nose,  and oropharynx are unremarkable.  He is edentulous. NECK:  Supple with no JVD, thyromegaly, or lymphadenopathy. LUNGS:  Bilateral wheezes. HEART:  Tachycardic and irregular. ABDOMEN:  Soft and nontender with no hepatosplenomegaly. EXTREMITIES:  No cyanosis, clubbing, or edema. NEURO:  No focal weakness. LYMPH NODES:  No cervical or supraclavicular enlargement.  LABORATORY DATA:  White count 11.8, hemoglobin 14.8, platelets 212,000. Sodium 134, potassium 3.7, bicarb 26, BUN 21, creatinine 1.5, calcium 10.2.  Chest x-ray reveals no acute infiltrate.  IMPRESSION/PLAN: 1. Chronic obstructive pulmonary disease exacerbation.  He will be     hospitalized and treated with Xopenex nebulizers, supplemental     oxygen, and IV antibiotics including Rocephin and azithromycin.     His oxygen saturation has been consistently in the 90s in the ER.     He has a mild leukocytosis, but no infiltrate on chest x-ray to     suggest a definite pneumonia at this point. 2. Rapid atrial fibrillation.  We will treat with IV diltiazem. 3. Non-Hodgkin's lymphoma.  He is followed closely by Oncology (Dr.     Mariel Sleet).  He has had no recent evidence of recurrent disease  he     states. 4. Diabetes.  We will follow before meals and at bedtime Accu-Cheks     and treat with sliding scale NovoLog as needed. 5. Chronic pain.  Continue morphine as needed.     Kingsley Callander. Ouida Sills, MD     ROF/MEDQ  D:  07/16/2012  T:  07/16/2012  Job:  [id number]

## 2012-07-17 NOTE — Progress Notes (Signed)
Subjective: He feels better. He says he wants to go home. He has no new complaints.  Objective: Vital signs in last 24 hours: Temp:  [97.6 F (36.4 C)-97.9 F (36.6 C)] 97.7 F (36.5 C) (07/24 0400) Pulse Rate:  [70-86] 70  (07/24 0400) Resp:  [20] 20  (07/24 0400) BP: (103-113)/(66-73) 109/73 mmHg (07/24 0524) SpO2:  [94 %-97 %] 95 % (07/24 0747) Weight:  [113.6 kg (250 lb 7.1 oz)] 113.6 kg (250 lb 7.1 oz) (07/24 0524) Weight change: 22.881 kg (50 lb 7.1 oz) Last BM Date: 07/13/12  Intake/Output from previous day: 07/23 0701 - 07/24 0700 In: 2690 [P.O.:1440; I.V.:1100; IV Piggyback:50] Out: -   PHYSICAL EXAM General appearance: alert, cooperative and no distress Resp: wheezes bilaterally Cardio: irregularly irregular rhythm GI: soft, non-tender; bowel sounds normal; no masses,  no organomegaly Extremities: extremities normal, atraumatic, no cyanosis or edema  Lab Results:    Basic Metabolic Panel:  Basename 07/16/12 0755 07/15/12 1937  NA 139 134*  K 4.1 3.7  CL 104 96  CO2 24 26  GLUCOSE 163* 96  BUN 25* 21  CREATININE 1.05 1.15  CALCIUM 10.0 10.2  MG -- --  PHOS -- --   Liver Function Tests: No results found for this basename: AST:2,ALT:2,ALKPHOS:2,BILITOT:2,PROT:2,ALBUMIN:2 in the last 72 hours No results found for this basename: LIPASE:2,AMYLASE:2 in the last 72 hours No results found for this basename: AMMONIA:2 in the last 72 hours CBC:  Basename 07/15/12 1937  WBC 11.8*  NEUTROABS 8.3*  HGB 14.8  HCT 44.0  MCV 92.8  PLT 212   Cardiac Enzymes: No results found for this basename: CKTOTAL:3,CKMB:3,CKMBINDEX:3,TROPONINI:3 in the last 72 hours BNP: No results found for this basename: PROBNP:3 in the last 72 hours D-Dimer: No results found for this basename: DDIMER:2 in the last 72 hours CBG:  Basename 07/16/12 2137 07/16/12 1638 07/16/12 1158 07/16/12 0736 07/15/12 2203  GLUCAP 156* 178* 141* 159* 186*   Hemoglobin A1C: No results found for  this basename: HGBA1C in the last 72 hours Fasting Lipid Panel: No results found for this basename: CHOL,HDL,LDLCALC,TRIG,CHOLHDL,LDLDIRECT in the last 72 hours Thyroid Function Tests: No results found for this basename: TSH,T4TOTAL,FREET4,T3FREE,THYROIDAB in the last 72 hours Anemia Panel: No results found for this basename: VITAMINB12,FOLATE,FERRITIN,TIBC,IRON,RETICCTPCT in the last 72 hours Coagulation: No results found for this basename: LABPROT:2,INR:2 in the last 72 hours Urine Drug Screen: Drugs of Abuse  No results found for this basename: labopia, cocainscrnur, labbenz, amphetmu, thcu, labbarb    Alcohol Level: No results found for this basename: ETH:2 in the last 72 hours Urinalysis: No results found for this basename: COLORURINE:2,APPERANCEUR:2,LABSPEC:2,PHURINE:2,GLUCOSEU:2,HGBUR:2,BILIRUBINUR:2,KETONESUR:2,PROTEINUR:2,UROBILINOGEN:2,NITRITE:2,LEUKOCYTESUR:2 in the last 72 hours Misc. Labs:  ABGS No results found for this basename: PHART,PCO2,PO2ART,TCO2,HCO3 in the last 72 hours CULTURES Recent Results (from the past 240 hour(s))  MRSA PCR SCREENING     Status: Normal   Collection Time   07/15/12 11:39 PM      Component Value Range Status Comment   MRSA by PCR NEGATIVE  NEGATIVE Final    Studies/Results: Dg Chest 2 View  07/15/2012  *RADIOLOGY REPORT*  Clinical Data: Fever.  Cough  CHEST - 2 VIEW  Comparison: 11/14/2011  Findings:  Negative for pneumonia or effusion.  Negative for heart failure.  No mass lesion.  IMPRESSION: No active cardiopulmonary disease.  Original Report Authenticated By: Camelia Phenes, M.D.    Medications:  Prior to Admission:  Prescriptions prior to admission  Medication Sig Dispense Refill  . albuterol (ACCUNEB) 0.63  MG/3ML nebulizer solution Take 1 ampule by nebulization every 6 (six) hours as needed. Wheezing      . albuterol (PROVENTIL HFA;VENTOLIN HFA) 108 (90 BASE) MCG/ACT inhaler Inhale 2 puffs into the lungs every 6 (six) hours as  needed. Wheezing      . aspirin 325 MG tablet Take 325 mg by mouth daily.      Marland Kitchen lidocaine (LIDODERM) 5 % Place 1 patch onto the skin daily as needed. Remove & Discard patch within 12 hours or as directed by MD. Pain      . metFORMIN (GLUCOPHAGE) 500 MG tablet Take 500 mg by mouth 2 (two) times daily.      Marland Kitchen morphine (MSIR) 15 MG tablet Take 15 mg by mouth 4 (four) times daily as needed.      . senna (SENOKOT) 8.6 MG tablet Take 1 tablet by mouth daily as needed. Constipation      . sodium chloride (AYR) 0.65 % nasal spray Place 1 spray into the nose as needed. Allergies       Scheduled:   . aspirin  325 mg Oral Daily  . azithromycin  500 mg Intravenous Q24H  . cefTRIAXone (ROCEPHIN)  IV  1 g Intravenous Q24H  . diltiazem  60 mg Oral Q6H  . enoxaparin (LOVENOX) injection  40 mg Subcutaneous Q24H  . guaiFENesin  1,200 mg Oral BID  . insulin aspart  0-15 Units Subcutaneous TID WC  . insulin aspart  0-5 Units Subcutaneous QHS  . levalbuterol  1.25 mg Nebulization Q6H  . metFORMIN  500 mg Oral BID WC  . methylPREDNISolone (SOLU-MEDROL) injection  125 mg Intravenous Q6H  . pneumococcal 23 valent vaccine  0.5 mL Intramuscular Tomorrow-1000  . sodium chloride  3 mL Intravenous Q12H  . sodium chloride      . Travoprost (BAK Free)  1 drop Both Eyes QHS  . DISCONTD: levalbuterol  1.25 mg Nebulization Q6H  . DISCONTD: levalbuterol  1.25 mg Nebulization Q6H   Continuous:   . sodium chloride 50 mL/hr at 07/17/12 0500   ZOX:[id number], acetaminophen, alum & mag hydroxide-simeth, lidocaine, morphine, ondansetron (ZOFRAN) IV, ondansetron  Assesment: He has COPD exacerbation and is better but still has some end expiratory wheezes. I don't think he's quite ready for discharge. Active Problems:  * No active hospital problems. *     Plan: Continue treatments I think he probably will be able to go home tomorrow. He wants to see the oncologist can go ahead and get blood work that is scheduled  for next week    LOS: 2 days   River Mckercher L 07/17/2012, 8:52 AM

## 2012-07-18 DIAGNOSIS — J441 Chronic obstructive pulmonary disease with (acute) exacerbation: Secondary | ICD-10-CM | POA: Diagnosis present

## 2012-07-18 LAB — GLUCOSE, CAPILLARY
Glucose-Capillary: 143 mg/dL — ABNORMAL HIGH (ref 70–99)
Glucose-Capillary: 138 mg/dL — ABNORMAL HIGH (ref 70–99)

## 2012-07-18 MED ORDER — METHYLPREDNISOLONE 4 MG PO KIT: PACK | ORAL | Status: AC

## 2012-07-18 MED ORDER — CEFUROXIME AXETIL 500 MG PO TABS: 500.0000 mg | ORAL_TABLET | Freq: Two times a day (BID) | ORAL | Status: AC

## 2012-07-18 MED ORDER — TRAVOPROST (BAK FREE) 0.004 % OP SOLN: 1.0000 [drp] | Freq: Every day | OPHTHALMIC | Status: DC

## 2012-07-18 MED ORDER — DILTIAZEM HCL ER COATED BEADS 240 MG PO TB24: 240.0000 mg | ORAL_TABLET | Freq: Every day | ORAL | Status: DC

## 2012-07-18 NOTE — Progress Notes (Signed)
Patient alert, oriented and in stable condition at the time of discharge. Patient being discharged home with family. Patient verbalized understanding of discharge instructions.

## 2012-07-18 NOTE — Progress Notes (Signed)
Subjective: He was admitted with COPD exacerbation. He feels much better. He also had atrial fibrillation with rapid ventricular response.  Objective: Vital signs in last 24 hours: Temp:  [97.5 F (36.4 C)-98.7 F (37.1 C)] 97.8 F (36.6 C) (07/25 0730) Pulse Rate:  [72-79] 78  (07/25 0531) Resp:  [18-20] 18  (07/25 0531) BP: (94-125)/(55-76) 108/63 mmHg (07/25 0400) SpO2:  [88 %-98 %] 88 % (07/25 0750) Weight:  [114 kg (251 lb 5.2 oz)-117.3 kg (258 lb 9.6 oz)] 117.3 kg (258 lb 9.6 oz) (07/25 0730) Weight change: 0.4 kg (14.1 oz) Last BM Date: 07/17/12  Intake/Output from previous day: 07/24 0701 - 07/25 0700 In: 2890 [P.O.:1440; I.V.:1150; IV Piggyback:300] Out: -   PHYSICAL EXAM General appearance: alert, cooperative and no distress Resp: rhonchi bilaterally Cardio: irregularly irregular rhythm GI: soft, non-tender; bowel sounds normal; no masses,  no organomegaly Extremities: extremities normal, atraumatic, no cyanosis or edema  Lab Results:    Basic Metabolic Panel:  Basename 07/16/12 0755 07/15/12 1937  NA 139 134*  K 4.1 3.7  CL 104 96  CO2 24 26  GLUCOSE 163* 96  BUN 25* 21  CREATININE 1.05 1.15  CALCIUM 10.0 10.2  MG -- --  PHOS -- --   Liver Function Tests: No results found for this basename: AST:2,ALT:2,ALKPHOS:2,BILITOT:2,PROT:2,ALBUMIN:2 in the last 72 hours No results found for this basename: LIPASE:2,AMYLASE:2 in the last 72 hours No results found for this basename: AMMONIA:2 in the last 72 hours CBC:  Basename 07/15/12 1937  WBC 11.8*  NEUTROABS 8.3*  HGB 14.8  HCT 44.0  MCV 92.8  PLT 212   Cardiac Enzymes: No results found for this basename: CKTOTAL:3,CKMB:3,CKMBINDEX:3,TROPONINI:3 in the last 72 hours BNP: No results found for this basename: PROBNP:3 in the last 72 hours D-Dimer: No results found for this basename: DDIMER:2 in the last 72 hours CBG:  Basename 07/17/12 0821 07/16/12 2137 07/16/12 1638 07/16/12 1158 07/16/12 0736  07/15/12 2203  GLUCAP 156* 156* 178* 141* 159* 186*   Hemoglobin A1C: No results found for this basename: HGBA1C in the last 72 hours Fasting Lipid Panel: No results found for this basename: CHOL,HDL,LDLCALC,TRIG,CHOLHDL,LDLDIRECT in the last 72 hours Thyroid Function Tests: No results found for this basename: TSH,T4TOTAL,FREET4,T3FREE,THYROIDAB in the last 72 hours Anemia Panel: No results found for this basename: VITAMINB12,FOLATE,FERRITIN,TIBC,IRON,RETICCTPCT in the last 72 hours Coagulation: No results found for this basename: LABPROT:2,INR:2 in the last 72 hours Urine Drug Screen: Drugs of Abuse  No results found for this basename: labopia, cocainscrnur, labbenz, amphetmu, thcu, labbarb    Alcohol Level: No results found for this basename: ETH:2 in the last 72 hours Urinalysis: No results found for this basename: COLORURINE:2,APPERANCEUR:2,LABSPEC:2,PHURINE:2,GLUCOSEU:2,HGBUR:2,BILIRUBINUR:2,KETONESUR:2,PROTEINUR:2,UROBILINOGEN:2,NITRITE:2,LEUKOCYTESUR:2 in the last 72 hours Misc. Labs:  ABGS No results found for this basename: PHART,PCO2,PO2ART,TCO2,HCO3 in the last 72 hours CULTURES Recent Results (from the past 240 hour(s))  URINE CULTURE     Status: Normal   Collection Time   07/15/12 10:06 PM      Component Value Range Status Comment   Specimen Description URINE, CLEAN CATCH   Final    Special Requests NONE   Final    Culture  Setup Time 07/15/2012 23:16   Final    Colony Count NO GROWTH   Final    Culture NO GROWTH   Final    Report Status 07/17/2012 FINAL   Final   MRSA PCR SCREENING     Status: Normal   Collection Time   07/15/12 11:39 PM  Component Value Range Status Comment   MRSA by PCR NEGATIVE  NEGATIVE Final    Studies/Results: No results found.  Medications:  Prior to Admission:  Prescriptions prior to admission  Medication Sig Dispense Refill  . albuterol (ACCUNEB) 0.63 MG/3ML nebulizer solution Take 1 ampule by nebulization every 6 (six) hours  as needed. Wheezing      . albuterol (PROVENTIL HFA;VENTOLIN HFA) 108 (90 BASE) MCG/ACT inhaler Inhale 2 puffs into the lungs every 6 (six) hours as needed. Wheezing      . aspirin 325 MG tablet Take 325 mg by mouth daily.      Marland Kitchen lidocaine (LIDODERM) 5 % Place 1 patch onto the skin daily as needed. Remove & Discard patch within 12 hours or as directed by MD. Pain      . metFORMIN (GLUCOPHAGE) 500 MG tablet Take 500 mg by mouth 2 (two) times daily.      Marland Kitchen morphine (MSIR) 15 MG tablet Take 15 mg by mouth 4 (four) times daily as needed.      . senna (SENOKOT) 8.6 MG tablet Take 1 tablet by mouth daily as needed. Constipation      . sodium chloride (AYR) 0.65 % nasal spray Place 1 spray into the nose as needed. Allergies       Scheduled:   . aspirin  325 mg Oral Daily  . azithromycin  500 mg Intravenous Q24H  . cefTRIAXone (ROCEPHIN)  IV  1 g Intravenous Q24H  . diltiazem  60 mg Oral Q6H  . enoxaparin (LOVENOX) injection  40 mg Subcutaneous Q24H  . guaiFENesin  1,200 mg Oral BID  . insulin aspart  0-15 Units Subcutaneous TID WC  . insulin aspart  0-5 Units Subcutaneous QHS  . levalbuterol  1.25 mg Nebulization Q6H  . metFORMIN  500 mg Oral BID WC  . methylPREDNISolone (SOLU-MEDROL) injection  125 mg Intravenous Q6H  . pneumococcal 23 valent vaccine  0.5 mL Intramuscular Tomorrow-1000  . sodium chloride  3 mL Intravenous Q12H  . sodium chloride      . Travoprost (BAK Free)  1 drop Both Eyes QHS   Continuous:   . sodium chloride 50 mL/hr at 07/18/12 0600   ZHY:[id number], acetaminophen, alum & mag hydroxide-simeth, lidocaine, morphine, ondansetron (ZOFRAN) IV, ondansetron  Assesment: He has COPD with exacerbation. He had atrial fibrillation with rapid ventricular response is much improved. I think he is ready for discharge Active Problems:  * No active hospital problems. *     Plan: Discharge home today please see discharge summary for details    LOS: 3 days   [place]  L 07/18/2012, 8:18 AM

## 2012-07-18 NOTE — Discharge Summary (Signed)
Physician Discharge Summary  Patient ID: [name] MRN: [id number] DOB/AGE: [birth year] 65 y.o. Primary Care Physician:Jurline Folger L, MD Admit date: 07/15/2012 Discharge date: 07/18/2012    Discharge Diagnoses:   Principal Problem:  *Acute exacerbation of COPD with asthma Active Problems:  DIABETES MELLITUS, TYPE II  Atrial fibrillation  GASTROESOPHAGEAL REFLUX DISEASE  NON-HODGKIN'S LYMPHOMA, HX OF  Glaucoma  COPD (chronic obstructive pulmonary disease)  Orthostatic hypotension  Obstructive sleep apnea   Medication List  As of 07/18/2012  8:29 AM   TAKE these medications         albuterol 108 (90 BASE) MCG/ACT inhaler   Commonly known as: PROVENTIL HFA;VENTOLIN HFA   Inhale 2 puffs into the lungs every 6 (six) hours as needed. Wheezing      albuterol 0.63 MG/3ML nebulizer solution   Commonly known as: ACCUNEB   Take 1 ampule by nebulization every 6 (six) hours as needed. Wheezing      aspirin 325 MG tablet   Take 325 mg by mouth daily.      AYR 0.65 % nasal spray   Generic drug: sodium chloride   Place 1 spray into the nose as needed. Allergies      cefUROXime 500 MG tablet   Commonly known as: CEFTIN   Take 1 tablet (500 mg total) by mouth 2 (two) times daily.      diltiazem 240 MG 24 hr tablet   Commonly known as: CARDIZEM LA   Take 1 tablet (240 mg total) by mouth daily.      lidocaine 5 %   Commonly known as: LIDODERM   Place 1 patch onto the skin daily as needed. Remove & Discard patch within 12 hours or as directed by MD. Pain      metFORMIN 500 MG tablet   Commonly known as: GLUCOPHAGE   Take 500 mg by mouth 2 (two) times daily.      methylPREDNISolone 4 MG tablet   Commonly known as: MEDROL DOSEPAK   follow package directions      morphine 15 MG tablet   Commonly known as: MSIR   Take 15 mg by mouth 4 (four) times daily as needed.      senna 8.6 MG tablet   Commonly known as: SENOKOT   Take 1 tablet by mouth daily as needed. Constipation        Travoprost (BAK Free) 0.004 % Soln ophthalmic solution   Commonly known as: TRAVATAN   Place 1 drop into both eyes at bedtime.            Discharged Condition: Improved    Consults: Oncology  Significant Diagnostic Studies: Dg Chest 2 View  07/15/2012  *RADIOLOGY REPORT*  Clinical Data: Fever.  Cough  CHEST - 2 VIEW  Comparison: 11/14/2011  Findings:  Negative for pneumonia or effusion.  Negative for heart failure.  No mass lesion.  IMPRESSION: No active cardiopulmonary disease.  Original Report Authenticated By: Camelia Phenes, M.D.    Lab Results: Basic Metabolic Panel:  Basename 07/16/12 0755 07/15/12 1937  NA 139 134*  K 4.1 3.7  CL 104 96  CO2 24 26  GLUCOSE 163* 96  BUN 25* 21  CREATININE 1.05 1.15  CALCIUM 10.0 10.2  MG -- --  PHOS -- --   Liver Function Tests: No results found for this basename: AST:2,ALT:2,ALKPHOS:2,BILITOT:2,PROT:2,ALBUMIN:2 in the last 72 hours   CBC:  Basename 07/15/12 1937  WBC 11.8*  NEUTROABS 8.3*  HGB 14.8  HCT 44.0  MCV 92.8  PLT 212    Recent Results (from the past 240 hour(s))  URINE CULTURE     Status: Normal   Collection Time   07/15/12 10:06 PM      Component Value Range Status Comment   Specimen Description URINE, CLEAN CATCH   Final    Special Requests NONE   Final    Culture  Setup Time 07/15/2012 23:16   Final    Colony Count NO GROWTH   Final    Culture NO GROWTH   Final    Report Status 07/17/2012 FINAL   Final   MRSA PCR SCREENING     Status: Normal   Collection Time   07/15/12 11:39 PM      Component Value Range Status Comment   MRSA by PCR NEGATIVE  NEGATIVE Final      Hospital Course: He was admitted with COPD exacerbation and atrial fibrillation with rapid ventricular response. He was treated with intravenous antibiotics steroids inhaled bronchodilators and initially with IV diltiazem. He improved rapidly and was ready for discharge at approximately the third hospital day. He had oncology  consultation because he has an upcoming appointment and wanted to see if the blood work that was already done in the hospital would suffice  Discharge Exam: Blood pressure 108/63, pulse 78, temperature 97.8 F (36.6 C), temperature source Oral, resp. rate 18, height 6\' 2"  (1.88 m), weight 117.3 kg (258 lb 9.6 oz), SpO2 88.00%. He is awake and alert. His chest is much clearer and he still has some prolonged expiration. He is in slow atrial fibrillation.  Disposition: Home he does not want home health services. We discussed cardiology consultation he has seen a cardiologist in the past and been told that he does not need to be on Coumadin so he does not want to do that at this point because of the expense. He will return to my office in about 2 weeks  Discharge Orders    Future Appointments: Provider: Department: Dept Phone: Center:   08/19/2012 9:30 AM [facility] [phone] [place]   08/21/2012 11:00 AM Randall An, MD [facility] [phone] None        Signed: [name] Pager [phone]  07/18/2012, 8:29 AM

## 2012-08-16 ENCOUNTER — Other Ambulatory Visit (HOSPITAL_COMMUNITY): Payer: Medicare Other

## 2012-08-19 ENCOUNTER — Encounter (HOSPITAL_COMMUNITY)
Admission: RE | Admit: 2012-08-19 | Discharge: 2012-08-19 | Disposition: A | Discharge status: Home / Self Care | Payer: Medicare Other | Source: Ambulatory Visit | Attending: Oncology | Admitting: Oncology

## 2012-08-19 DIAGNOSIS — Z87898 Personal history of other specified conditions: Secondary | ICD-10-CM

## 2012-08-19 DIAGNOSIS — C8589 Other specified types of non-Hodgkin lymphoma, extranodal and solid organ sites: Secondary | ICD-10-CM | POA: Insufficient documentation

## 2012-08-19 LAB — GLUCOSE, CAPILLARY: Glucose-Capillary: 103 mg/dL — ABNORMAL HIGH (ref 70–99)

## 2012-08-19 MED ORDER — FLUDEOXYGLUCOSE F - 18 (FDG) INJECTION
18.3000 | Freq: Once | INTRAVENOUS | Status: AC | PRN
  Administered 2012-08-19: 18.3 via INTRAVENOUS

## 2012-08-21 ENCOUNTER — Encounter (HOSPITAL_COMMUNITY): Payer: Medicare Other | Attending: Oncology | Admitting: Oncology

## 2012-08-21 ENCOUNTER — Encounter (HOSPITAL_COMMUNITY): Payer: Self-pay | Admitting: Oncology

## 2012-08-21 VITALS — BP 126/74 | HR 98 | Temp 98.0°F | Resp 20 | Wt 239.8 lb

## 2012-08-21 DIAGNOSIS — C8589 Other specified types of non-Hodgkin lymphoma, extranodal and solid organ sites: Secondary | ICD-10-CM

## 2012-08-21 DIAGNOSIS — B353 Tinea pedis: Secondary | ICD-10-CM

## 2012-08-21 MED ORDER — CLOTRIMAZOLE-BETAMETHASONE 1-0.05 % EX CREA: TOPICAL_CREAM | Freq: Two times a day (BID) | CUTANEOUS | Status: AC

## 2012-08-21 NOTE — Progress Notes (Signed)
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 CHOP with Neulasta support. His PET scan done just in the day shows no evidence for recurrent disease. #2 ringworm right foot and we will treat him with Lotrisone twice a day for 2 weeks. I would rather treat him topically due to his diabetes #3 COPD #4 chronic back pain left upper quadrant discomfort but he still does not want to see a pain clinic specialist. #5 atrial fibrillation for which she is seeing Dr. 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 He has ringworm in 3 places on the right foot lateral to the ankle medial to the ankle and one distal at the base of the second toe the largest areas' 4 cm across medially we will treat that with we'll do so. His review of systems oncologically is noncontributory. We will see him in 6 months and from now on we'll do CAT scans once a year blood work and physical exams every 6 months.

## 2012-08-21 NOTE — Patient Instructions (Addendum)
[name]  DOB 1946/01/22 CSN [id number]  MRN [id number] Dr. Glenford Peers   [facility]  Discharge Instructions  RECOMMENDATIONS MADE BY THE CONSULTANT AND ANY TEST RESULTS WILL BE SENT TO YOUR REFERRING DOCTOR.   EXAM FINDINGS BY MD TODAY AND SIGNS AND SYMPTOMS TO REPORT TO CLINIC OR PRIMARY MD: PET scan was negative. Areas on feet are probably ringworm.  Apply cream directly to area and just on the outer edge twice daily for 14 days.  Can do an additional 14 days if needed.  MEDICATIONS PRESCRIBED: Lotrisone cream Follow label directions  INSTRUCTIONS GIVEN AND DISCUSSED: Other :  Report any new lumps, recurrent infections ,etc.  SPECIAL INSTRUCTIONS/FOLLOW-UP: Lab work Needed in 6 months and Return to Clinic in 6 months after labs to see PA.   I acknowledge that I have been informed and understand all the instructions given to me and received a copy. I do not have any more questions at this time, but understand that I may call the [facility] at [facility] at [phone] during business hours should I have any further questions or need assistance in obtaining follow-up care.    __________________________________________  _____________  __________ Signature of Patient or Authorized Representative            Date                   Time    __________________________________________ Nurse's Signature

## 2012-12-03 ENCOUNTER — Other Ambulatory Visit (HOSPITAL_COMMUNITY): Payer: Self-pay | Admitting: Orthopaedic Surgery

## 2012-12-05 ENCOUNTER — Encounter (HOSPITAL_COMMUNITY): Payer: Self-pay | Admitting: Pharmacy Technician

## 2012-12-09 ENCOUNTER — Encounter (HOSPITAL_COMMUNITY): Payer: Self-pay

## 2012-12-09 ENCOUNTER — Encounter (HOSPITAL_COMMUNITY)
Admission: RE | Admit: 2012-12-09 | Discharge: 2012-12-09 | Disposition: A | Discharge status: Home / Self Care | Payer: Medicare Other | Source: Ambulatory Visit | Attending: Orthopaedic Surgery | Admitting: Orthopaedic Surgery

## 2012-12-09 HISTORY — DX: Other specified postprocedural states: Z98.890

## 2012-12-09 HISTORY — DX: Personal history of other diseases of the digestive system: Z87.19

## 2012-12-09 HISTORY — DX: Nausea with vomiting, unspecified: R11.2

## 2012-12-09 HISTORY — DX: Carcinoma in situ of skin, unspecified: D04.9

## 2012-12-09 HISTORY — DX: Other complications of anesthesia, initial encounter: T88.59XA

## 2012-12-09 HISTORY — DX: Adverse effect of unspecified anesthetic, initial encounter: T41.45XA

## 2012-12-09 LAB — URINALYSIS, ROUTINE W REFLEX MICROSCOPIC
Bilirubin Urine: NEGATIVE
Glucose, UA: NEGATIVE mg/dL
Hgb urine dipstick: NEGATIVE
Ketones, ur: NEGATIVE mg/dL
Protein, ur: NEGATIVE mg/dL
Urobilinogen, UA: 0.2 mg/dL (ref 0.0–1.0)

## 2012-12-09 LAB — BASIC METABOLIC PANEL
BUN: 17 mg/dL (ref 6–23)
Chloride: 104 mEq/L (ref 96–112)
Creatinine, Ser: 0.95 mg/dL (ref 0.50–1.35)
Glucose, Bld: 93 mg/dL (ref 70–99)
Potassium: 4.7 mEq/L (ref 3.5–5.1)

## 2012-12-09 LAB — CBC
HCT: 43.8 % (ref 39.0–52.0)
Hemoglobin: 14.9 g/dL (ref 13.0–17.0)
MCH: 31.1 pg (ref 26.0–34.0)
MCHC: 34 g/dL (ref 30.0–36.0)
RDW: 13.9 % (ref 11.5–15.5)

## 2012-12-09 LAB — APTT: aPTT: 32 seconds (ref 24–37)

## 2012-12-09 NOTE — Patient Instructions (Addendum)
20 [name]  12/09/2012   Your procedure is scheduled on: 12/13/12  Report to [facility] at [phone].  Call this number if you have problems the morning of surgery [phone]   Remember: please bring inhaler with you and CPAP mask and tubing.   Do not eat food or drink liquids After Midnight.     Take these medicines the morning of surgery with A SIP OF WATER: albuterol, cardizem   Do not wear jewelry, make-up or nail polish.  Do not wear lotions, powders, or perfumes. You may wear deodorant.  Do not shave 48 hours prior to surgery. Men may shave face and neck.  Do not bring valuables to the hospital.  Contacts, dentures or bridgework may not be worn into surgery.  Leave suitcase in the car. After surgery it may be brought to your room.  For patients admitted to the hospital, checkout time is 11:00 AM the day of discharge.    Special Instructions: Shower using CHG 2 nights before surgery and the night before surgery.  If you shower the day of surgery use CHG.  Use special wash - you have one bottle of CHG for all showers.  You should use approximately 1/3 of the bottle for each shower.   Please read over the following fact sheets that you were given: MRSA Information, Blood transfusion fact sheet Birdie Sons, RN  pre op nurse call if needed [phone]    FAILURE TO FOLLOW THESE INSTRUCTIONS MAY RESULT IN CANCELLATION OF YOUR SURGERY   Patient Signature: ___________________________________________

## 2012-12-09 NOTE — Progress Notes (Signed)
Chest x-ray 07/15/12 on EPIC, abnormal EKG 07/15/12 in EPIC- will repeat, Last cardiologist office visit 05/23/11 Dr. Dietrich Pates on [facility]

## 2012-12-11 LAB — MRSA CULTURE

## 2012-12-13 ENCOUNTER — Encounter (HOSPITAL_COMMUNITY): Payer: Self-pay | Admitting: Anesthesiology

## 2012-12-13 ENCOUNTER — Inpatient Hospital Stay (HOSPITAL_COMMUNITY): Payer: Medicare Other

## 2012-12-13 ENCOUNTER — Encounter (HOSPITAL_COMMUNITY): Payer: Self-pay | Admitting: *Deleted

## 2012-12-13 ENCOUNTER — Inpatient Hospital Stay (HOSPITAL_COMMUNITY)
Admission: RE | Admit: 2012-12-13 | Discharge: 2012-12-17 | DRG: 469 | Disposition: A | Discharge status: Skilled Nursing Facility | Payer: Medicare Other | Source: Ambulatory Visit | Attending: Orthopaedic Surgery | Admitting: Orthopaedic Surgery

## 2012-12-13 ENCOUNTER — Inpatient Hospital Stay (HOSPITAL_COMMUNITY): Payer: Medicare Other | Admitting: Anesthesiology

## 2012-12-13 ENCOUNTER — Encounter (HOSPITAL_COMMUNITY)
Admission: RE | Disposition: A | Discharge status: Skilled Nursing Facility | Payer: Self-pay | Source: Ambulatory Visit | Attending: Orthopaedic Surgery

## 2012-12-13 DIAGNOSIS — I4891 Unspecified atrial fibrillation: Secondary | ICD-10-CM | POA: Diagnosis present

## 2012-12-13 DIAGNOSIS — J4489 Other specified chronic obstructive pulmonary disease: Secondary | ICD-10-CM | POA: Diagnosis present

## 2012-12-13 DIAGNOSIS — M87059 Idiopathic aseptic necrosis of unspecified femur: Principal | ICD-10-CM

## 2012-12-13 DIAGNOSIS — D62 Acute posthemorrhagic anemia: Secondary | ICD-10-CM | POA: Diagnosis not present

## 2012-12-13 DIAGNOSIS — M8970 Major osseous defect, unspecified site: Secondary | ICD-10-CM | POA: Diagnosis present

## 2012-12-13 DIAGNOSIS — Z87898 Personal history of other specified conditions: Secondary | ICD-10-CM

## 2012-12-13 DIAGNOSIS — J449 Chronic obstructive pulmonary disease, unspecified: Secondary | ICD-10-CM

## 2012-12-13 DIAGNOSIS — Z01812 Encounter for preprocedural laboratory examination: Secondary | ICD-10-CM

## 2012-12-13 DIAGNOSIS — E119 Type 2 diabetes mellitus without complications: Secondary | ICD-10-CM | POA: Diagnosis present

## 2012-12-13 DIAGNOSIS — Z8614 Personal history of Methicillin resistant Staphylococcus aureus infection: Secondary | ICD-10-CM

## 2012-12-13 DIAGNOSIS — J189 Pneumonia, unspecified organism: Secondary | ICD-10-CM | POA: Diagnosis not present

## 2012-12-13 DIAGNOSIS — K219 Gastro-esophageal reflux disease without esophagitis: Secondary | ICD-10-CM | POA: Diagnosis present

## 2012-12-13 HISTORY — PX: TOTAL HIP ARTHROPLASTY: SHX124

## 2012-12-13 LAB — GLUCOSE, CAPILLARY: Glucose-Capillary: 138 mg/dL — ABNORMAL HIGH (ref 70–99)

## 2012-12-13 LAB — ABO/RH: ABO/RH(D): A POS

## 2012-12-13 LAB — TYPE AND SCREEN: ABO/RH(D): A POS

## 2012-12-13 SURGERY — ARTHROPLASTY, HIP, TOTAL, ANTERIOR APPROACH
Anesthesia: General | Site: Hip | Laterality: Left | Wound class: Clean

## 2012-12-13 MED ORDER — ALBUTEROL SULFATE 0.63 MG/3ML IN NEBU
1.0000 | INHALATION_SOLUTION | Freq: Four times a day (QID) | RESPIRATORY_TRACT | Status: DC | PRN
  Filled 2012-12-13: qty 3

## 2012-12-13 MED ORDER — MIDAZOLAM HCL 5 MG/5ML IJ SOLN
INTRAMUSCULAR | Status: DC | PRN
  Administered 2012-12-13: 2 mg via INTRAVENOUS

## 2012-12-13 MED ORDER — BUPIVACAINE HCL (PF) 0.5 % IJ SOLN
INTRAMUSCULAR | Status: DC | PRN
  Administered 2012-12-13: 15 mg

## 2012-12-13 MED ORDER — DIPHENHYDRAMINE HCL 12.5 MG/5ML PO ELIX: 12.5000 mg | ORAL_SOLUTION | ORAL | Status: DC | PRN

## 2012-12-13 MED ORDER — ACETAMINOPHEN 10 MG/ML IV SOLN
INTRAVENOUS | Status: DC | PRN
  Administered 2012-12-13: 1000 mg via INTRAVENOUS

## 2012-12-13 MED ORDER — INSULIN ASPART 100 UNIT/ML ~~LOC~~ SOLN
0.0000 [IU] | Freq: Three times a day (TID) | SUBCUTANEOUS | Status: DC
  Administered 2012-12-16: 2 [IU] via SUBCUTANEOUS

## 2012-12-13 MED ORDER — METHOCARBAMOL 500 MG PO TABS
500.0000 mg | ORAL_TABLET | Freq: Four times a day (QID) | ORAL | Status: DC | PRN
  Administered 2012-12-14: 500 mg via ORAL
  Filled 2012-12-13: qty 1

## 2012-12-13 MED ORDER — HYDROMORPHONE HCL PF 1 MG/ML IJ SOLN
1.0000 mg | INTRAMUSCULAR | Status: DC | PRN
  Administered 2012-12-13 (×3): 1 mg via INTRAVENOUS
  Filled 2012-12-13 (×3): qty 1

## 2012-12-13 MED ORDER — MENTHOL 3 MG MT LOZG: 1.0000 | LOZENGE | OROMUCOSAL | Status: DC | PRN

## 2012-12-13 MED ORDER — METFORMIN HCL 500 MG PO TABS
500.0000 mg | ORAL_TABLET | Freq: Two times a day (BID) | ORAL | Status: DC
  Administered 2012-12-13 – 2012-12-17 (×8): 500 mg via ORAL
  Filled 2012-12-13 (×9): qty 1

## 2012-12-13 MED ORDER — HYDROMORPHONE HCL PF 1 MG/ML IJ SOLN
0.2500 mg | INTRAMUSCULAR | Status: DC | PRN
  Administered 2012-12-13 (×2): 0.25 mg via INTRAVENOUS

## 2012-12-13 MED ORDER — DILTIAZEM HCL ER COATED BEADS 240 MG PO TB24
240.0000 mg | ORAL_TABLET | Freq: Every day | ORAL | Status: DC
  Administered 2012-12-14 – 2012-12-17 (×4): 240 mg via ORAL
  Filled 2012-12-13 (×5): qty 1

## 2012-12-13 MED ORDER — TRAVOPROST (BAK FREE) 0.004 % OP SOLN
1.0000 [drp] | Freq: Every day | OPHTHALMIC | Status: DC
  Administered 2012-12-13 – 2012-12-16 (×4): 1 [drp] via OPHTHALMIC
  Filled 2012-12-13: qty 2.5

## 2012-12-13 MED ORDER — ONDANSETRON HCL 4 MG/2ML IJ SOLN
INTRAMUSCULAR | Status: DC | PRN
  Administered 2012-12-13: 4 mg via INTRAVENOUS

## 2012-12-13 MED ORDER — ALUM & MAG HYDROXIDE-SIMETH 200-200-20 MG/5ML PO SUSP: 30.0000 mL | ORAL | Status: DC | PRN

## 2012-12-13 MED ORDER — FENTANYL CITRATE 0.05 MG/ML IJ SOLN
INTRAMUSCULAR | Status: DC | PRN
  Administered 2012-12-13: 25 ug via INTRAVENOUS
  Administered 2012-12-13: 50 ug via INTRAVENOUS
  Administered 2012-12-13: 25 ug via INTRAVENOUS
  Administered 2012-12-13: 50 ug via INTRAVENOUS
  Administered 2012-12-13 (×2): 25 ug via INTRAVENOUS
  Administered 2012-12-13: 50 ug via INTRAVENOUS

## 2012-12-13 MED ORDER — KETOROLAC TROMETHAMINE 15 MG/ML IJ SOLN
7.5000 mg | Freq: Four times a day (QID) | INTRAMUSCULAR | Status: AC
  Administered 2012-12-13 – 2012-12-14 (×4): 7.5 mg via INTRAVENOUS
  Filled 2012-12-13 (×4): qty 1

## 2012-12-13 MED ORDER — OXYCODONE HCL 5 MG PO TABS
5.0000 mg | ORAL_TABLET | ORAL | Status: DC | PRN
  Administered 2012-12-13 – 2012-12-17 (×10): 10 mg via ORAL
  Filled 2012-12-13 (×2): qty 2
  Filled 2012-12-13: qty 1
  Filled 2012-12-13 (×8): qty 2

## 2012-12-13 MED ORDER — FERROUS SULFATE 325 (65 FE) MG PO TABS
325.0000 mg | ORAL_TABLET | Freq: Three times a day (TID) | ORAL | Status: DC
  Administered 2012-12-13 – 2012-12-17 (×9): 325 mg via ORAL
  Filled 2012-12-13 (×14): qty 1

## 2012-12-13 MED ORDER — LACTATED RINGERS IV SOLN
INTRAVENOUS | Status: DC
  Administered 2012-12-13: 1000 mL via INTRAVENOUS
  Administered 2012-12-13: 13:00:00 via INTRAVENOUS

## 2012-12-13 MED ORDER — ALBUTEROL SULFATE (5 MG/ML) 0.5% IN NEBU
2.5000 mg | INHALATION_SOLUTION | Freq: Four times a day (QID) | RESPIRATORY_TRACT | Status: DC | PRN
Start: 2012-12-13 — End: 2012-12-16
  Administered 2012-12-16: 2.5 mg via RESPIRATORY_TRACT
  Filled 2012-12-13: qty 0.5

## 2012-12-13 MED ORDER — ONDANSETRON HCL 4 MG/2ML IJ SOLN: 4.0000 mg | Freq: Four times a day (QID) | INTRAMUSCULAR | Status: DC | PRN

## 2012-12-13 MED ORDER — SODIUM CHLORIDE 0.9 % IV SOLN
INTRAVENOUS | Status: DC
  Administered 2012-12-13: 17:00:00 via INTRAVENOUS

## 2012-12-13 MED ORDER — PROPOFOL INFUSION 10 MG/ML OPTIME
INTRAVENOUS | Status: DC | PRN
  Administered 2012-12-13: 75 ug/kg/min via INTRAVENOUS

## 2012-12-13 MED ORDER — CEFAZOLIN SODIUM 1-5 GM-% IV SOLN
1.0000 g | Freq: Four times a day (QID) | INTRAVENOUS | Status: AC
  Administered 2012-12-13 – 2012-12-14 (×2): 1 g via INTRAVENOUS
  Filled 2012-12-13 (×2): qty 50

## 2012-12-13 MED ORDER — OXYCODONE HCL ER 20 MG PO T12A
20.0000 mg | EXTENDED_RELEASE_TABLET | Freq: Two times a day (BID) | ORAL | Status: DC
  Administered 2012-12-13 – 2012-12-15 (×5): 20 mg via ORAL
  Filled 2012-12-13 (×5): qty 1

## 2012-12-13 MED ORDER — ONDANSETRON HCL 4 MG PO TABS
4.0000 mg | ORAL_TABLET | Freq: Four times a day (QID) | ORAL | Status: DC | PRN
  Administered 2012-12-16 – 2012-12-17 (×2): 4 mg via ORAL
  Filled 2012-12-13 (×2): qty 1

## 2012-12-13 MED ORDER — ALBUTEROL SULFATE HFA 108 (90 BASE) MCG/ACT IN AERS
2.0000 | INHALATION_SPRAY | Freq: Four times a day (QID) | RESPIRATORY_TRACT | Status: DC | PRN
  Filled 2012-12-13: qty 6.7

## 2012-12-13 MED ORDER — PHENYLEPHRINE HCL 10 MG/ML IJ SOLN
INTRAMUSCULAR | Status: DC | PRN
  Administered 2012-12-13 (×2): 20 ug via INTRAVENOUS

## 2012-12-13 MED ORDER — ZOLPIDEM TARTRATE 5 MG PO TABS: 5.0000 mg | ORAL_TABLET | Freq: Every evening | ORAL | Status: DC | PRN

## 2012-12-13 MED ORDER — ACETAMINOPHEN 650 MG RE SUPP: 650.0000 mg | Freq: Four times a day (QID) | RECTAL | Status: DC | PRN

## 2012-12-13 MED ORDER — CEFAZOLIN SODIUM-DEXTROSE 2-3 GM-% IV SOLR
2.0000 g | INTRAVENOUS | Status: AC
  Administered 2012-12-13: 2 g via INTRAVENOUS

## 2012-12-13 MED ORDER — PHENOL 1.4 % MT LIQD: 1.0000 | OROMUCOSAL | Status: DC | PRN

## 2012-12-13 MED ORDER — LIDOCAINE HCL (CARDIAC) 20 MG/ML IV SOLN
INTRAVENOUS | Status: DC | PRN
  Administered 2012-12-13: 100 mg via INTRAVENOUS

## 2012-12-13 MED ORDER — ACETAMINOPHEN 325 MG PO TABS
650.0000 mg | ORAL_TABLET | Freq: Four times a day (QID) | ORAL | Status: DC | PRN
  Administered 2012-12-15 – 2012-12-17 (×6): 650 mg via ORAL
  Filled 2012-12-13 (×6): qty 2

## 2012-12-13 MED ORDER — PROMETHAZINE HCL 25 MG/ML IJ SOLN: 6.2500 mg | INTRAMUSCULAR | Status: DC | PRN

## 2012-12-13 MED ORDER — 0.9 % SODIUM CHLORIDE (POUR BTL) OPTIME
TOPICAL | Status: DC | PRN
  Administered 2012-12-13: 1000 mL

## 2012-12-13 MED ORDER — RIVAROXABAN 10 MG PO TABS
10.0000 mg | ORAL_TABLET | Freq: Every day | ORAL | Status: DC
  Administered 2012-12-14 – 2012-12-17 (×4): 10 mg via ORAL
  Filled 2012-12-13 (×5): qty 1

## 2012-12-13 MED ORDER — METOCLOPRAMIDE HCL 10 MG PO TABS: 5.0000 mg | ORAL_TABLET | Freq: Three times a day (TID) | ORAL | Status: DC | PRN

## 2012-12-13 MED ORDER — DOCUSATE SODIUM 100 MG PO CAPS
100.0000 mg | ORAL_CAPSULE | Freq: Two times a day (BID) | ORAL | Status: DC
  Administered 2012-12-13 – 2012-12-17 (×7): 100 mg via ORAL
  Filled 2012-12-13 (×5): qty 1

## 2012-12-13 MED ORDER — METOCLOPRAMIDE HCL 5 MG/ML IJ SOLN: 5.0000 mg | Freq: Three times a day (TID) | INTRAMUSCULAR | Status: DC | PRN

## 2012-12-13 MED ORDER — METHOCARBAMOL 100 MG/ML IJ SOLN
500.0000 mg | Freq: Four times a day (QID) | INTRAVENOUS | Status: DC | PRN
  Administered 2012-12-13: 500 mg via INTRAVENOUS
  Filled 2012-12-13: qty 5

## 2012-12-13 SURGICAL SUPPLY — 39 items
BAG SPEC THK2 15X12 ZIP CLS (MISCELLANEOUS) ×2
BAG ZIPLOCK 12X15 (MISCELLANEOUS) ×4 IMPLANT
BLADE SAW SGTL 18X1.27X75 (BLADE) ×2 IMPLANT
CELLS DAT CNTRL 66122 CELL SVR (MISCELLANEOUS) ×1 IMPLANT
CLOTH BEACON ORANGE TIMEOUT ST (SAFETY) ×2 IMPLANT
DRAPE C-ARM 42X72 X-RAY (DRAPES) ×2 IMPLANT
DRAPE STERI IOBAN 125X83 (DRAPES) ×2 IMPLANT
DRAPE U-SHAPE 47X51 STRL (DRAPES) ×6 IMPLANT
DRSG MEPILEX BORDER 4X12 (GAUZE/BANDAGES/DRESSINGS) ×2 IMPLANT
DRSG MEPILEX BORDER 4X8 (GAUZE/BANDAGES/DRESSINGS) ×2 IMPLANT
DRSG XEROFORM 1X8 (GAUZE/BANDAGES/DRESSINGS) ×2 IMPLANT
DURAPREP 26ML APPLICATOR (WOUND CARE) ×2 IMPLANT
ELECT BLADE TIP CTD 4 INCH (ELECTRODE) ×2 IMPLANT
ELECT REM PT RETURN 9FT ADLT (ELECTROSURGICAL) ×2
ELECTRODE REM PT RTRN 9FT ADLT (ELECTROSURGICAL) ×1 IMPLANT
FACESHIELD LNG OPTICON STERILE (SAFETY) ×8 IMPLANT
GAUZE XEROFORM 1X8 LF (GAUZE/BANDAGES/DRESSINGS) ×2 IMPLANT
GLOVE BIO SURGEON STRL SZ7 (GLOVE) ×2 IMPLANT
GLOVE BIO SURGEON STRL SZ7.5 (GLOVE) ×2 IMPLANT
GLOVE BIOGEL PI IND STRL 7.5 (GLOVE) IMPLANT
GLOVE BIOGEL PI IND STRL 8 (GLOVE) ×1 IMPLANT
GLOVE BIOGEL PI INDICATOR 7.5 (GLOVE)
GLOVE BIOGEL PI INDICATOR 8 (GLOVE) ×1
GLOVE ECLIPSE 7.0 STRL STRAW (GLOVE) ×2 IMPLANT
GOWN STRL REIN XL XLG (GOWN DISPOSABLE) ×4 IMPLANT
KIT BASIN OR (CUSTOM PROCEDURE TRAY) ×2 IMPLANT
PACK TOTAL JOINT (CUSTOM PROCEDURE TRAY) ×2 IMPLANT
PADDING CAST COTTON 6X4 STRL (CAST SUPPLIES) ×2 IMPLANT
RETRACTOR WND ALEXIS 18 MED (MISCELLANEOUS) IMPLANT
RTRCTR WOUND ALEXIS 18CM MED (MISCELLANEOUS) ×2
STAPLER VISISTAT 35W (STAPLE) IMPLANT
SUT ETHIBOND NAB CT1 #1 30IN (SUTURE) ×2 IMPLANT
SUT VIC AB 1 CT1 36 (SUTURE) ×4 IMPLANT
SUT VIC AB 2-0 CT1 27 (SUTURE) ×2
SUT VIC AB 2-0 CT1 TAPERPNT 27 (SUTURE) ×2 IMPLANT
SUT VLOC 180 0 24IN GS25 (SUTURE) ×1 IMPLANT
TOWEL OR 17X26 10 PK STRL BLUE (TOWEL DISPOSABLE) ×4 IMPLANT
TOWEL OR NON WOVEN STRL DISP B (DISPOSABLE) ×2 IMPLANT
TRAY FOLEY CATH 14FRSI W/METER (CATHETERS) ×2 IMPLANT

## 2012-12-13 NOTE — Plan of Care (Signed)
Problem: Consults Goal: Diagnosis- Total Joint Replacement Outcome: Completed/Met Date Met:  12/13/12 Primary Total Hip

## 2012-12-13 NOTE — Anesthesia Procedure Notes (Signed)
Spinal  Patient location during procedure: OR Staffing Performed by: anesthesiologist  Preanesthetic Checklist Completed: patient identified, site marked, surgical consent, pre-op evaluation, timeout performed, IV checked, risks and benefits discussed and monitors and equipment checked Spinal Block Patient position: sitting Prep: Betadine Patient monitoring: heart rate, continuous pulse ox and blood pressure Injection technique: single-shot Needle Needle type: Quincke  Needle gauge: 22 G Needle length: 9 cm Assessment Sensory level: T8 Additional Notes Expiration date of kit checked and confirmed. Patient tolerated procedure well, without complications.

## 2012-12-13 NOTE — Brief Op Note (Signed)
12/13/2012  2:15 PM  PATIENT:  [name]  66 y.o. male  PRE-OPERATIVE DIAGNOSIS:  Avascular necrosis left hip  POST-OPERATIVE DIAGNOSIS:  Avascular necrosis left hip  PROCEDURE:  Procedure(s) (LRB) with comments: TOTAL HIP ARTHROPLASTY ANTERIOR APPROACH (Left) - Left Total Hip Arthroplasty, Anterior Approach (C-Arm)  SURGEON:  Surgeon(s) and Role:    * Kathryne Hitch, MD - Primary  PHYSICIAN ASSISTANT:   ASSISTANTS: none   ANESTHESIA:   spinal  EBL:  Total I/O In: 1750 [I.V.:1750] Out: 625 [Urine:125; Blood:500]  BLOOD ADMINISTERED:none  DRAINS: none   LOCAL MEDICATIONS USED:  NONE  SPECIMEN:  No Specimen  DISPOSITION OF SPECIMEN:  N/A  COUNTS:  YES  TOURNIQUET:  * No tourniquets in log *  DICTATION: .Other Dictation: Dictation Number [id number]  PLAN OF CARE: Admit to inpatient   PATIENT DISPOSITION:  PACU - hemodynamically stable.   Delay start of Pharmacological VTE agent (>24hrs) due to surgical blood loss or risk of bleeding: no

## 2012-12-13 NOTE — Progress Notes (Signed)
Utilization review completed.  

## 2012-12-13 NOTE — Transfer of Care (Signed)
Immediate Anesthesia Transfer of Care Note  Patient: [name]  Procedure(s) Performed: Procedure(s) (LRB) with comments: TOTAL HIP ARTHROPLASTY ANTERIOR APPROACH (Left) - Left Total Hip Arthroplasty, Anterior Approach (C-Arm)  Patient Location: PACU  Anesthesia Type:Regional  Level of Consciousness: awake, alert  and oriented  Airway & Oxygen Therapy: Patient Spontanous Breathing  Post-op Assessment: Report given to PACU RN  Post vital signs: Reviewed and stable  Complications: No apparent anesthesia complications

## 2012-12-13 NOTE — Anesthesia Preprocedure Evaluation (Addendum)
Anesthesia Evaluation  Patient identified by MRN, date of birth, ID band Patient awake    Reviewed: Allergy & Precautions, H&P , NPO status , Patient's Chart, lab work & pertinent test results  Airway Mallampati: II TM Distance: >3 FB Neck ROM: Full    Dental No notable dental hx.    Pulmonary sleep apnea and Continuous Positive Airway Pressure Ventilation , COPD COPD inhaler, Current Smoker,  breath sounds clear to auscultation  Pulmonary exam normal       Cardiovascular negative cardio ROS  + dysrhythmias Atrial Fibrillation Rhythm:Irregular Rate:Normal     Neuro/Psych negative neurological ROS  negative psych ROS   GI/Hepatic Neg liver ROS, GERD-  ,  Endo/Other  diabetes, Oral Hypoglycemic Agents  Renal/GU negative Renal ROS  negative genitourinary   Musculoskeletal negative musculoskeletal ROS (+)   Abdominal   Peds negative pediatric ROS (+)  Hematology negative hematology ROS (+)   Anesthesia Other Findings   Reproductive/Obstetrics negative OB ROS                           Anesthesia Physical Anesthesia Plan  ASA: III  Anesthesia Plan: Spinal   Post-op Pain Management:    Induction:   Airway Management Planned: Simple Face Mask  Additional Equipment:   Intra-op Plan:   Post-operative Plan: Extubation in OR  Informed Consent: I have reviewed the patients History and Physical, chart, labs and discussed the procedure including the risks, benefits and alternatives for the proposed anesthesia with the patient or authorized representative who has indicated his/her understanding and acceptance.   Dental advisory given  Plan Discussed with: CRNA and Surgeon  Anesthesia Plan Comments:        Anesthesia Quick Evaluation

## 2012-12-13 NOTE — H&P (Signed)
TOTAL HIP ADMISSION H&P  Patient is admitted for left total hip arthroplasty.  Subjective:  Chief Complaint: left hip pain  HPI: [name], 66 y.o. male, has a history of pain and functional disability in the left hip(s) due to avascular necrosis and patient has failed non-surgical conservative treatments for greater than 12 weeks to include NSAID's and/or analgesics, weight reduction as appropriate and activity modification.  Onset of symptoms was gradual starting 4 years ago with gradually worsening course since that time.The patient noted no past surgery on the left hip(s).  Patient currently rates pain in the left hip at 8 out of 10 with activity. Patient has night pain, worsening of pain with activity and weight bearing, trendelenberg gait, pain that interfers with activities of daily living, pain with passive range of motion and crepitus. Patient has evidence of subchondral cysts, subchondral sclerosis and joint space narrowing by imaging studies. This condition presents safety issues increasing the risk of fall.  There is no current active infection.  Patient Active Problem List   Diagnosis Date Noted  . Avascular necrosis of hip 12/13/2012  . Acute exacerbation of COPD with asthma 07/18/2012  . COPD (chronic obstructive pulmonary disease)   . Orthostatic hypotension   . Obstructive sleep apnea   . Degenerative disc disease, lumbar   . Colonic polyp   . Glaucoma   . DIABETES MELLITUS, TYPE II 01/10/2011  . OBESITY 01/10/2011  . TOBACCO ABUSE 01/10/2011  . Atrial fibrillation 01/10/2011  . GASTROESOPHAGEAL REFLUX DISEASE 01/10/2011  . NON-HODGKIN'S LYMPHOMA, HX OF 01/10/2011   Past Medical History  Diagnosis Date  . Atrial fibrillation   . COPD (chronic obstructive pulmonary disease)     mild PFT abnormalities; normal ABG  . Tobacco abuse     45 pack years  . Orthostatic hypotension     lightheadness; no definate loss of consciousness  . Splenic abscess 07/2009  .  Diabetes mellitus     type 2  . Obesity   . Pedal edema   . Glaucoma(365)   . Obstructive sleep apnea     treated with CPAP  . GERD (gastroesophageal reflux disease)   . Degenerative disc disease, lumbar     HNP of the LS spine  . Colonic polyp   . Avascular necrosis of bones of both hips   . Hx MRSA infection 2010    sith splenic abcess  . Non Hodgkin's lymphoma 06/2009    chemotherapy until 11/2010  . Cancer   . H/O hiatal hernia   . 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 Complication of anesthesia     hard to wake up, "felt like died and revived me"  . PONV (postoperative nausea and vomiting)     Past Surgical History  Procedure Date  . Abscess drainage 2010    Splenic  . Colonoscopy w/ polypectomy 2008    with snare polypectomy  . Cataract extraction     Right with lens implant  . Lymph node biopsy 2010    done x 2 for NHL diagnosis  . Tonsillectomy   . Eye surgery     lens implant  . Knee arthroscopy     right  . Anal fissure repair 66years old    No prescriptions prior to admission   Allergies  Allergen Reactions  . Codeine Other (See Comments)    REACTION: Shakes   . Pred Forte (Prednisolone Acetate) Other (See Comments)    Severe burning  History  Substance Use Topics  . Smoking status: Current Every Day Smoker -- 1.0 packs/day for 55 years    Types: Cigarettes  . Smokeless tobacco: Never Used  . Alcohol Use: No    Family History  Problem Relation Age of Onset  . Heart failure Mother 80    results of chf  . Leukemia Father   . Colon cancer Sister   . Heart disease Brother   . Heart disease Brother   . Heart disease Brother   . Heart disease Brother      Review of Systems  Respiratory: Positive for shortness of breath.   Musculoskeletal: Positive for joint pain.  All other systems reviewed and are negative.    Objective:  Physical Exam  Constitutional: He is oriented to person, place, and time. He appears well-developed and  well-nourished.  HENT:  Head: Normocephalic and atraumatic.  Eyes: EOM are normal. Pupils are equal, round, and reactive to light.  Neck: Normal range of motion. Neck supple.  Cardiovascular: Normal rate.   Respiratory: He has wheezes.  GI: Soft. Bowel sounds are normal.  Musculoskeletal:       Left hip: He exhibits decreased range of motion, decreased strength, bony tenderness and crepitus.  Neurological: He is alert and oriented to person, place, and time.  Skin: Skin is warm and dry.  Psychiatric: He has a normal mood and affect.    Vital signs in last 24 hours:    Labs:   Estimated Body mass index is 30.79 kg/(m^2) as calculated from the following:   Height as of 07/15/12: 6\' 2" (1.88 m).   Weight as of 08/21/12: 239 lb 12.8 oz(108.773 kg).   Imaging Review Plain radiographs demonstrate severe degenerative joint disease of the left hip(s). The bone quality appears to be good for age and reported activity level.  Assessment/Plan:  End stage arthritis, left hip(s)  The patient history, physical examination, clinical judgement of the provider and imaging studies are consistent with end stage degenerative joint disease of the left hip(s) and total hip arthroplasty is deemed medically necessary. The treatment options including medical management, injection therapy, arthroscopy and arthroplasty were discussed at length. The risks and benefits of total hip arthroplasty were presented and reviewed. The risks due to aseptic loosening, infection, stiffness, dislocation/subluxation,  thromboembolic complications and other imponderables were discussed.  The patient acknowledged the explanation, agreed to proceed with the plan and consent was signed. Patient is being admitted for inpatient treatment for surgery, pain control, PT, OT, prophylactic antibiotics, VTE prophylaxis, progressive ambulation and ADL's and discharge planning.The patient is planning to be discharged home with home health  services

## 2012-12-13 NOTE — Anesthesia Postprocedure Evaluation (Signed)
  Anesthesia Post-op Note  Patient: [name]  Procedure(s) Performed: Procedure(s) (LRB): TOTAL HIP ARTHROPLASTY ANTERIOR APPROACH (Left)  Patient Location: PACU  Anesthesia Type: Spinal  Level of Consciousness: awake and alert   Airway and Oxygen Therapy: Patient Spontanous Breathing  Post-op Pain: mild  Post-op Assessment: Post-op Vital signs reviewed, Patient's Cardiovascular Status Stable, Respiratory Function Stable, Patent Airway and No signs of Nausea or vomiting  Last Vitals:  Filed Vitals:   12/13/12 1430  BP: 95/69  Pulse: 71  Temp:   Resp: 14    Post-op Vital Signs: stable   Complications: No apparent anesthesia complications

## 2012-12-14 LAB — CBC
Hemoglobin: 11.3 g/dL — ABNORMAL LOW (ref 13.0–17.0)
MCH: 30.9 pg (ref 26.0–34.0)
MCV: 91.5 fL (ref 78.0–100.0)
RBC: 3.66 MIL/uL — ABNORMAL LOW (ref 4.22–5.81)
WBC: 10.8 10*3/uL — ABNORMAL HIGH (ref 4.0–10.5)

## 2012-12-14 LAB — BASIC METABOLIC PANEL
CO2: 28 mEq/L (ref 19–32)
Calcium: 8.8 mg/dL (ref 8.4–10.5)
Chloride: 103 mEq/L (ref 96–112)
Glucose, Bld: 101 mg/dL — ABNORMAL HIGH (ref 70–99)
Sodium: 136 mEq/L (ref 135–145)

## 2012-12-14 LAB — GLUCOSE, CAPILLARY: Glucose-Capillary: 110 mg/dL — ABNORMAL HIGH (ref 70–99)

## 2012-12-14 NOTE — Progress Notes (Signed)
Patient assisted to recliner. Patient sat in chair for estimate of 30 -45 minutes. Patient attempts to get up without walker and calling for assist. Reminded patient of expectation. Patient plans to call prior to getting oob chair next time.

## 2012-12-14 NOTE — Evaluation (Signed)
Occupational Therapy Evaluation Patient Details Name: [name] MRN: [id number] DOB: [birth year] Today's Date: 12/14/2012 Time: 3086-5784 OT Time Calculation (min): 28 min  OT Assessment / Plan / Recommendation Clinical Impression  Pt presents to OT s/p THR . Pt will benefit from skillled OT to increase I with ADL activity and return to PLOF.  Pt does not have help at home and must be I to return home. Pt will benefit from  post acute rehab venue    OT Assessment  Patient needs continued OT Services    Follow Up Recommendations  Home health OT;CIR;SNF (depending on progress and assist at home)       Equipment Recommendations  3 in 1 bedside comode       Frequency  Min 3X/week    Precautions / Restrictions Precautions Precautions: Anterior Hip       ADL  Lower Body Bathing: Maximal assistance;Simulated Where Assessed - Lower Body Bathing: Supported sit to stand Lower Body Dressing: Simulated;Maximal assistance Where Assessed - Lower Body Dressing: Supported sit to stand Toilet Transfer: Performed;Moderate assistance Toilet Transfer Method: Sit to stand;Other (comment) (from chair) Toileting - Clothing Manipulation and Hygiene: Performed;Minimal assistance Where Assessed - Toileting Clothing Manipulation and Hygiene: Standing Transfers/Ambulation Related to ADLs: Pt will need AE as pt is not able to get to feet . Pt open to using AE    OT Diagnosis: Generalized weakness  OT Problem List: Decreased strength OT Treatment Interventions: Self-care/ADL training;DME and/or AE instruction;Patient/family education   OT Goals Acute Rehab OT Goals OT Goal Formulation: With patient ADL Goals Pt Will Perform Lower Body Bathing: Sit to stand from chair;with adaptive equipment;with modified independence ADL Goal: Lower Body Bathing - Progress: Goal set today Pt Will Perform Lower Body Dressing: Sit to stand from chair;Sit to stand from bed;with modified independence ADL Goal: Lower  Body Dressing - Progress: Goal set today Pt Will Transfer to Toilet: with modified independence;Comfort height toilet ADL Goal: Toilet Transfer - Progress: Goal set today Pt Will Perform Toileting - Clothing Manipulation: Standing;with modified independence ADL Goal: Toileting - Clothing Manipulation - Progress: Goal set today  Visit Information  Last OT Received On: 12/14/12    Subjective Data  Subjective: I dont have anyone to really help me...   Prior Functioning     Home Living Lives With: Alone Type of Home: House Home Access: Ramped entrance Home Layout: One level Bathroom Shower/Tub: Engineer, manufacturing systems: Standard Home Adaptive Equipment: Wheelchair - powered;Walker - rolling;Grab bars in shower Prior Function Level of Independence: Independent Vocation: Retired Musician: No difficulties Dominant Hand: Right            Cognition  Overall Cognitive Status: Appears within functional limits for tasks assessed/performed Arousal/Alertness: Awake/alert    Extremity/Trunk Assessment Right Upper Extremity Assessment RUE ROM/Strength/Tone: WFL for tasks assessed Left Upper Extremity Assessment LUE ROM/Strength/Tone: WFL for tasks assessed     Mobility Transfers Transfers: Sit to Stand;Stand to Sit Sit to Stand: 3: Mod assist;With upper extremity assist;From chair/3-in-1 Stand to Sit: 3: Mod assist;With upper extremity assist;To chair/3-in-1 Details for Transfer Assistance: verbal cues for safety               End of Session OT - End of Session Activity Tolerance: Patient tolerated treatment well Patient left: in chair       Annya Lizana D 12/14/2012, 9:22 AM

## 2012-12-14 NOTE — Progress Notes (Signed)
Physical Therapy Treatment Patient Details Name: [name] MRN: [id number] DOB: [birth year] Today's Date: 12/14/2012 Time: 4098-1191 PT Time Calculation (min): 36 min  PT Assessment / Plan / Recommendation Comments on Treatment Session  Pt progressing well with ambulation, however requires assist for getting into/out of bed, concerning for pt being home alone.  continue to recommend ST SNF stay, however pt refusing at this time.     Follow Up Recommendations  SNF;Supervision/Assistance - 24 hour     Does the patient have the potential to tolerate intense rehabilitation     Barriers to Discharge        Equipment Recommendations  None recommended by PT    Recommendations for Other Services    Frequency 7X/week   Plan Discharge plan remains appropriate    Precautions / Restrictions Precautions Precautions: None Restrictions Weight Bearing Restrictions: No   Pertinent Vitals/Pain 3/10 pain    Mobility  Bed Mobility Bed Mobility: Supine to Sit Supine to Sit: HOB elevated;4: Min assist;With rails Details for Bed Mobility Assistance: Cues for hooking RLE under LLE to self assist LLE out of bed.  continued to require assist from therapist with LLE.  Cues for hand placement and technique.  Transfers Transfers: Sit to Stand;Stand to Sit Sit to Stand: 4: Min guard;From elevated surface;With upper extremity assist;From bed Stand to Sit: 4: Min guard;With upper extremity assist;With armrests;To chair/3-in-1 Details for Transfer Assistance: Pt demos better control when sitting to recliner during pm session.  Cues for safety and hand placement.  Ambulation/Gait Ambulation/Gait Assistance: 5: Supervision Ambulation Distance (Feet): 400 Feet Assistive device: Rolling walker Ambulation/Gait Assistance Details: Min cues for maintaining position inside of RW and keeping both hands on RW Gait Pattern: Step-to pattern;Decreased stance time - left;Decreased step length - left    Exercises  Total Joint Exercises Hip ABduction/ADduction: AROM;Left;10 reps;Standing Knee Flexion: AROM;Left;10 reps;Standing Marching in Standing: AROM;Left;10 reps;Standing   PT Diagnosis:    PT Problem List:   PT Treatment Interventions:     PT Goals Acute Rehab PT Goals PT Goal Formulation: With patient Time For Goal Achievement: 12/21/12 Potential to Achieve Goals: Good Pt will go Supine/Side to Sit: with modified independence PT Goal: Supine/Side to Sit - Progress: Progressing toward goal Pt will go Sit to Stand: with modified independence PT Goal: Sit to Stand - Progress: Progressing toward goal Pt will go Stand to Sit: with modified independence PT Goal: Stand to Sit - Progress: Progressing toward goal Pt will Ambulate: >150 feet;with modified independence;with rolling walker PT Goal: Ambulate - Progress: Progressing toward goal Pt will Perform Home Exercise Program: Independently PT Goal: Perform Home Exercise Program - Progress: Progressing toward goal  Visit Information  Last PT Received On: 12/14/12 Assistance Needed: +1    Subjective Data  Subjective: I'll be alright at home.  Patient Stated Goal: to go home   Cognition  Overall Cognitive Status: Appears within functional limits for tasks assessed/performed Arousal/Alertness: Awake/alert Orientation Level: Appears intact for tasks assessed Behavior During Session: [facility] for tasks performed    Balance     End of Session PT - End of Session Activity Tolerance: Patient tolerated treatment well Patient left: in chair;with call bell/phone within reach Nurse Communication: Mobility status   GP     Page, [name] 12/14/2012, 3:12 PM

## 2012-12-14 NOTE — Progress Notes (Deleted)
P.T. Reports pt did not do well with stairs. Pt does not feel secure about discharge today, but will have good help to get in the house tomorrow. Carroll Sage, PA, notified by phone, & ordered discharge delayed until tomorrow. Monnie Anspach, [employer]

## 2012-12-14 NOTE — Op Note (Signed)
NAMECORYDON, [name] NO.:  [id number]  MEDICAL RECORD NO.:  [id number]  LOCATION:  1615                         FACILITY:  [facility]  PHYSICIAN:  Vanita Panda. Magnus Ivan, M.D.DATE OF BIRTH:  10/15/1946  DATE OF PROCEDURE:  12/13/2012 DATE OF DISCHARGE:                              OPERATIVE REPORT   PREOPERATIVE DIAGNOSIS:  Avascular necrosis, left hip.  POSTOPERATIVE DIAGNOSIS:  Avascular necrosis, left hip.  PROCEDURE:  Left total hip arthroplasty through direct anterior approach.  IMPLANTS:  DePuy Sector Gription acetabular component size 56, size 36+ 4 neutral polyethylene liner, size 14 Corail femoral component with standard offset, size 36+ 1.5 metal hip ball.  SURGEON:  Vanita Panda. Magnus Ivan, M.D.  ANESTHESIA:  Spinal.  ANTIBIOTICS:  2 g IV Ancef.  BLOOD LOSS:  26 and 700 mL.  COMPLICATIONS:  None.  INDICATIONS:  [name] is a 66 year old with severe pain involving both of his hips.  The left hip has radiographic evidence of avascular necrosis that looks like to me more than anything.  He had had leukemia or lymphoma in the past and had been on steroids and chemotherapeutic agents.  He has been having slowly worsening hip pain for some time now. It has gotten to where it affects his activities of daily living, has gotten to debilitating and x-ray evidence showed the findings of joint space narrowing and felt sclerotic changes in the femoral head and flattening of the femoral head.  It was recommended that he undergo a total hip arthroplasty and he does wish to have this done.  The risks and benefits have been well versed and explained to him and he understands and wishes to proceed.  PROCEDURE DESCRIPTION:  After informed consent was obtained and appropriate left hip was marked, he was brought to the operating room and spinal anesthesia was obtained while he was sitting up on the stretcher.  He was then laid in supine position.  A Foley catheter  was placed and then both feet had traction boots applied.  He was then placed supine on the Hana fracture table with the perineal post in place and both legs were placed in inline skeletal traction, but no traction applied.  We then assessed his pelvis and hip under direct fluoroscopy, so we could obtain our leg lengths.  We prepped the left hip with DuraPrep and sterile drapes.  Time-out was called to identify the correct patient and correct left hip.  We then made an incision just posterior and inferior to the anterior superior iliac spine and carried this obliquely down the leg.  I dissected down to the tensor fascia lata and the tensor fascia was divided longitudinally.  I then proceeded with direct anterior approach of the hip.  A Cobra retractor was placed around the lateral neck and a Cobra retractor was placed up underneath the rectus femoris under the medial neck.  I cauterized the lateral femoral circumflex vessels.  I then opened the hip capsule and found a large effusion in the joint itself.  The Cobra retractor was then placed within the hip capsule.  Next, I made my femoral neck cut just proximal to the lesser trochanter using an  oscillating saw and completed this with an osteotome.  I placed a corkscrew guide in the femoral head and removed the femoral head in its entirety and found that had significant collapse.  I cleaned the acetabulum and debris including remnants of the labrum and released the transverse acetabular leg.  A bent Hohmann was placed underneath the rim of the anterior acetabulum and a Cobra retractor laterally.  I cleaned the deep acetabulum and debris and then began reaming from a size 43 reamer and 2-mm increments after 44 all the way to 56.  All reamers were placed under direct visualization and last two reamers were also placed under direct fluoroscopy, so I could obtain my depth of reaming, my inclination and my anteversion.  I then placed the real  DePuy Gription acetabular component size 56 and knocked this into the acetabulum under direct visualization and fluoroscopy.  Once I was pleased with the seating of this, I placed the apex hole eliminator guide and the real 36+ 4 neutral polyethylene liner.  Attention was then turned to the femur with the leg externally rotated to 90 degrees, extended and adducted.  I placed a Mueller retractor medially and a Hohmann behind the greater trochanter.  I released the lateral capsule and piriformis and then brought the leg up further using box cutting guide to open up the femoral canal and a rongeur to lateralize.  I then began broaching from the size 8 broach all the way up to size 14, the size 14 filled the canals.  I trialed a standard neck and a 36+ 1.5 hip ball.  We brought the leg back over and up and with traction and internal rotation.  We reduced the hip, it was stable with internal and external rotation and minimal shuck.  We measured leg lengths to be equal under direct fluoroscopy.  We then dislocated the hip and I placed the real.  I removed all trial components.  I placed the real size 14 femoral component followed by the real 36+ 1.5 metal hip ball.  We reduced this back in the acetabulum and it was stable.  We irrigated the soft tissues with normal saline solution.  I closed the joint capsule with interrupted #1 Ethibond suture followed by running 0 V-Loc in the tensor fascia.  A 2-0 Vicryl in the deep subcutaneous tissue and interrupted staples on the skin.  He was taken off of the Hana table. His leg lengths were felt to equal.  He was taken to the recovery room in stable condition.  All final counts were correct.  There were no complications noted.     Vanita Panda. Magnus Ivan, M.D.     CYB/MEDQ  D:  12/13/2012  T:  12/14/2012  Job:  [id number]

## 2012-12-14 NOTE — Progress Notes (Signed)
Subjective: 1 Day Post-Op Procedure(s) (LRB): TOTAL HIP ARTHROPLASTY ANTERIOR APPROACH (Left) Patient reports pain as mild.  Working with PT well.  Asymptomatic acute blood loss anemia.  Objective: Vital signs in last 24 hours: Temp:  [97.2 F (36.2 C)-100.6 F (38.1 C)] 97.8 F (36.6 C) (12/21 0457) Pulse Rate:  [60-102] 90  (12/21 0457) Resp:  [12-18] 16  (12/21 0457) BP: (78-112)/(49-74) 105/67 mmHg (12/21 0457) SpO2:  [91 %-100 %] 96 % (12/21 0457) Weight:  [109.317 kg (241 lb)] 109.317 kg (241 lb) (12/20 1602)  Intake/Output from previous day: 12/20 0701 - 12/21 0700 In: 4018.8 [P.O.:480; I.V.:3438.8; IV Piggyback:100] Out: 1500 [Urine:1000; Blood:500] Intake/Output this shift:     Basename 12/14/12 0427  HGB 11.3*    Basename 12/14/12 0427  WBC 10.8*  RBC 3.66*  HCT 33.5*  PLT 158    Basename 12/14/12 0427  NA 136  K 4.1  CL 103  CO2 28  BUN 21  CREATININE 1.11  GLUCOSE 101*  CALCIUM 8.8   No results found for this basename: LABPT:2,INR:2 in the last 72 hours  Sensation intact distally Intact pulses distally Dorsiflexion/Plantar flexion intact Incision: dressing C/D/I  Assessment/Plan: 1 Day Post-Op Procedure(s) (LRB): TOTAL HIP ARTHROPLASTY ANTERIOR APPROACH (Left) Up with therapy  BLACKMAN,CHRISTOPHER Y 12/14/2012, 10:49 AM

## 2012-12-14 NOTE — Evaluation (Signed)
Physical Therapy Evaluation Patient Details Name: [name] MRN: [id number] DOB: 22-Aug-1946 Today's Date: 12/14/2012 Time: 4098-1191 PT Time Calculation (min): 31 min  PT Assessment / Plan / Recommendation Clinical Impression  Pt. is s/p L THA-direct anterior. Pt. lives alone and does not indicate that he has 24/7 supervision. Pt. may benefit from SNF prior to DC. Will continue PT while in acute care. Pt. has DME.    PT Assessment  Patient needs continued PT services    Follow Up Recommendations  SNF;Supervision/Assistance - 24 hour    Does the patient have the potential to tolerate intense rehabilitation      Barriers to Discharge Decreased caregiver support      Equipment Recommendations  None recommended by PT    Recommendations for Other Services     Frequency 7X/week    Precautions / Restrictions Precautions Precautions: None   Pertinent Vitals/Pain HR in 120's after ambulating RN aware Pain , 4 L hip      Mobility  Bed Mobility Bed Mobility: Supine to Sit Supine to Sit: HOB elevated;4: Min assist;With rails Details for Bed Mobility Assistance: verbal cues on how to move trunk to side and get legs over. Transfers Sit to Stand: 4: Min assist;From elevated surface;With upper extremity assist;From bed Stand to Sit: 3: Mod assist;With upper extremity assist;With armrests;To chair/3-in-1 Details for Transfer Assistance: Pt, had difficulty sitting down to recliner. Pt. relates to back and bad hip. Ambulation/Gait Ambulation/Gait Assistance: 4: Min assist Ambulation Distance (Feet): 400 Feet Assistive device: Rolling walker Ambulation/Gait Assistance Details: cues on sequence and step length, posture inside RW. Gait Pattern: Step-to pattern;Decreased stance time - left;Decreased step length - left    Shoulder Instructions     Exercises     PT Diagnosis: Difficulty walking  PT Problem List: Decreased strength;Decreased range of motion;Decreased activity  tolerance;Decreased mobility;Decreased knowledge of use of DME;Decreased knowledge of precautions PT Treatment Interventions: DME instruction;Gait training;Stair training;Functional mobility training;Therapeutic activities;Therapeutic exercise;Patient/family education   PT Goals Acute Rehab PT Goals PT Goal Formulation: With patient Time For Goal Achievement: 12/21/12 Potential to Achieve Goals: Good Pt will go Supine/Side to Sit: with modified independence PT Goal: Supine/Side to Sit - Progress: Goal set today Pt will go Sit to Supine/Side: with modified independence PT Goal: Sit to Supine/Side - Progress: Goal set today Pt will go Sit to Stand: with modified independence PT Goal: Sit to Stand - Progress: Goal set today Pt will go Stand to Sit: with modified independence PT Goal: Stand to Sit - Progress: Goal set today Pt will Ambulate: >150 feet;with modified independence;with rolling walker PT Goal: Ambulate - Progress: Goal set today Pt will Perform Home Exercise Program: Independently PT Goal: Perform Home Exercise Program - Progress: Goal set today  Visit Information  Last PT Received On: 12/14/12 Assistance Needed: +1    Subjective Data  Subjective: I don't have anyone. Patient Stated Goal: to go home   Prior Functioning  Home Living Lives With: Alone Type of Home: House Home Access: Ramped entrance Home Layout: One level Bathroom Shower/Tub: Engineer, manufacturing systems: Standard Home Adaptive Equipment:  (hospital bed with trapeze) Prior Function Level of Independence: Independent Vocation: Retired Musician: No difficulties Dominant Hand: Right    Cognition  Overall Cognitive Status: Appears within functional limits for tasks assessed/performed Arousal/Alertness: Awake/alert    Extremity/Trunk Assessment Right Upper Extremity Assessment RUE ROM/Strength/Tone: WFL for tasks assessed Left Upper Extremity Assessment LUE  ROM/Strength/Tone: [facility] for tasks assessed Right Lower Extremity Assessment  RLE ROM/Strength/Tone: Within functional levels RLE Sensation: History of peripheral neuropathy Left Lower Extremity Assessment LLE ROM/Strength/Tone: Deficits LLE ROM/Strength/Tone Deficits: initially decreased ability to advance LLE first. Improved with ambulating. LLE Sensation: History of peripheral neuropathy Trunk Assessment Trunk Assessment: Normal   Balance    End of Session PT - End of Session Activity Tolerance: Patient tolerated treatment well Patient left: in chair;with call bell/phone within reach Nurse Communication: Mobility status  GP     Rada Hay 12/14/2012, 10:15 AM  [phone]

## 2012-12-15 LAB — CBC
MCH: 30.8 pg (ref 26.0–34.0)
MCHC: 33.3 g/dL (ref 30.0–36.0)
MCV: 92.3 fL (ref 78.0–100.0)
Platelets: 127 10*3/uL — ABNORMAL LOW (ref 150–400)
RBC: 3.38 MIL/uL — ABNORMAL LOW (ref 4.22–5.81)
RDW: 14.1 % (ref 11.5–15.5)

## 2012-12-15 LAB — GLUCOSE, CAPILLARY: Glucose-Capillary: 110 mg/dL — ABNORMAL HIGH (ref 70–99)

## 2012-12-15 MED ORDER — POLYETHYLENE GLYCOL 3350 17 G PO PACK
17.0000 g | PACK | Freq: Two times a day (BID) | ORAL | Status: DC
  Administered 2012-12-15 – 2012-12-17 (×4): 17 g via ORAL
  Filled 2012-12-15 (×5): qty 1

## 2012-12-15 NOTE — Progress Notes (Signed)
Clinical Social Work Department CLINICAL SOCIAL WORK PLACEMENT NOTE 12/15/2012  Patient:  OCTAVIA, MOTTOLA  Account Number:  [id number] Admit date:  12/13/2012  Clinical Social Worker:  Doroteo Glassman  Date/time:  12/15/2012 12:28 PM  Clinical Social Work is seeking post-discharge placement for this patient at the following level of care:   SKILLED NURSING   (*CSW will update this form in Epic as items are completed)   *Will give with offers. Discussed SNFs in [place] with Pt's daughter on 12/15/12  Patient/family provided with [facility] Department of Clinical Social Work's list of facilities offering this level of care within the geographic area requested by the patient (or if unable, by the patient's family).   12/15/12 Patient/family informed of their freedom to choose among providers that offer the needed level of care, that participate in Medicare, Medicaid or managed care program needed by the patient, have an available bed and are willing to accept the patient.  12/15/12  Patient/family informed of [facility]' ownership interest in [facility], as well as of the fact that they are under no obligation to receive care at this facility.  PASARR submitted to EDS on 12/15/2012 PASARR number received from EDS on 12/15/2012  FL2 transmitted to all facilities in geographic area requested by pt/family on  12/15/2012 FL2 transmitted to all facilities within larger geographic area on   Patient informed that his/her managed care company has contracts with or will negotiate with  certain facilities, including the following:     Patient/family informed of bed offers received:   Patient chooses bed at  Physician recommends and patient chooses bed at    Patient to be transferred to  on   Patient to be transferred to facility by   The following physician request were entered in Epic:   Additional Comments:  CSW to continue to follow.  Providence Crosby,  LCSWA Clinical Social Work [phone]

## 2012-12-15 NOTE — Progress Notes (Signed)
Occupational Therapy Treatment Patient Details Name: [name] MRN: [id number] DOB: January 06, 1946 Today's Date: 12/15/2012 Time: 4098-1191 OT Time Calculation (min): 26 min  OT Assessment / Plan / Recommendation Comments on Treatment Session Pt doing better today, Daugther and neighbors will help upon DC    Follow Up Recommendations  Home health OT             Frequency Min 3X/week   Plan Discharge plan needs to be updated    Precautions / Restrictions Precautions Precautions: None Restrictions Weight Bearing Restrictions: No       ADL  Grooming: Performed;Wash/dry hands;Wash/dry face Where Assessed - Grooming: Supported standing Lower Body Dressing: Performed;Minimal assistance Where Assessed - Lower Body Dressing: Unsupported sit to stand Toilet Transfer: Performed;Minimal assistance Toilet Transfer Method: Sit to stand;Other (comment) (stood at toilet) Acupuncturist: Comfort height toilet Toileting - Architect and Hygiene: Performed;Minimal assistance Where Assessed - Engineer, mining and Hygiene: Standing ADL Comments: Pt will borrow an elevated toilet seat from brother     OT Goals ADL Goals ADL Goal: Statistician - Progress: Progressing toward goals ADL Goal: Toileting - Clothing Manipulation - Progress: Progressing toward goals  Visit Information  Last OT Received On: 12/15/12          Cognition  Overall Cognitive Status: Appears within functional limits for tasks assessed/performed Arousal/Alertness: Awake/alert Orientation Level: Appears intact for tasks assessed Behavior During Session: [facility] for tasks performed    Mobility  Shoulder Instructions Bed Mobility Bed Mobility: Supine to Sit Supine to Sit: HOB elevated;4: Min assist;With rails Details for Bed Mobility Assistance: Pt has a hospital bed at home Transfers Transfers: Sit to Stand;Stand to Sit Sit to Stand: 4: Min guard;From elevated surface;With  upper extremity assist;From bed Stand to Sit: 4: Min guard;With upper extremity assist;With armrests;To chair/3-in-1             End of Session OT - End of Session Activity Tolerance: Patient tolerated treatment well Patient left: in chair Nurse Communication: Mobility status  GO     Alba Cory 12/15/2012, 9:01 AM

## 2012-12-15 NOTE — Progress Notes (Signed)
Patient ID: [name], male   DOB: [birth year], 66 y.o.   MRN: [id number] Patient comfortable this morning. States his left leg feels weak and cannot lift his leg. He is a little slow with therapy but should be able to be discharged on Monday. Hemoglobin stable.

## 2012-12-15 NOTE — Progress Notes (Signed)
Physical Therapy Treatment Patient Details Name: [name] MRN: [id number] DOB: 1945/12/28 Today's Date: 12/15/2012 Time: 1478-2956 PT Time Calculation (min): 32 min  PT Assessment / Plan / Recommendation Comments on Treatment Session  Pt limited by headache during session, however agreed to ambulate in hallway.  Pt to D/C home tomorrow.     Follow Up Recommendations  Home health PT     Does the patient have the potential to tolerate intense rehabilitation     Barriers to Discharge        Equipment Recommendations  None recommended by PT    Recommendations for Other Services    Frequency 7X/week   Plan Discharge plan needs to be updated    Precautions / Restrictions Precautions Precautions: None Restrictions Weight Bearing Restrictions: No   Pertinent Vitals/Pain Pt with headache during session.  RN notified and Tylenol given.     Mobility  Bed Mobility Bed Mobility: Sit to Supine Supine to Sit: HOB elevated;4: Min assist;With rails Sit to Supine: 4: Min assist Details for Bed Mobility Assistance: Cues for hooking RLE under LLE to self assist LE into bed.  Continues to require some assist for getting extremity into bed.  Transfers Transfers: Sit to Stand;Stand to Sit Sit to Stand: 5: Supervision;With upper extremity assist;With armrests;From chair/3-in-1 Stand to Sit: 5: Supervision;With upper extremity assist;To bed Details for Transfer Assistance: Min cues for hand placement and LE management when sitting/standing.  Ambulation/Gait Ambulation/Gait Assistance: 5: Supervision Ambulation Distance (Feet): 400 Feet Assistive device: Rolling walker Ambulation/Gait Assistance Details: cues for step to vs step through gait pattern with cues for increased quad activation on LLE due to pt tendency to ambulate with knee flexed.  Gait Pattern: Step-to pattern;Decreased stance time - left;Decreased step length - left Gait velocity: decreased    Exercises     PT Diagnosis:     PT Problem List:   PT Treatment Interventions:     PT Goals Acute Rehab PT Goals PT Goal Formulation: With patient Time For Goal Achievement: 12/21/12 Potential to Achieve Goals: Good Pt will go Sit to Supine/Side: with modified independence PT Goal: Sit to Supine/Side - Progress: Progressing toward goal Pt will go Sit to Stand: with modified independence PT Goal: Sit to Stand - Progress: Progressing toward goal Pt will go Stand to Sit: with modified independence PT Goal: Stand to Sit - Progress: Progressing toward goal Pt will Ambulate: >150 feet;with modified independence;with rolling walker PT Goal: Ambulate - Progress: Progressing toward goal  Visit Information  Last PT Received On: 12/15/12 Assistance Needed: +1    Subjective Data  Subjective: My head is hurting bad today Patient Stated Goal: to go home   Cognition  Overall Cognitive Status: Appears within functional limits for tasks assessed/performed Arousal/Alertness: Awake/alert Orientation Level: Appears intact for tasks assessed Behavior During Session: [facility] for tasks performed    Balance     End of Session PT - End of Session Activity Tolerance: Patient limited by pain Patient left: in bed;with call bell/phone within reach;with nursing in room Nurse Communication: Mobility status;Patient requests pain meds   GP     Page, [name] 12/15/2012, 11:52 AM

## 2012-12-15 NOTE — Progress Notes (Addendum)
Physical Therapy Treatment Patient Details Name: [name] MRN: [id number] DOB: 30-Mar-1946 Today's Date: 12/15/2012 Time: 1308-6578 PT Time Calculation (min): 29 min  PT Assessment / Plan / Recommendation Comments on Treatment Session  Pt states he has chills this afternoon and doesn't feel quite right.  Temp was 98.2, BP WNL.  He also states he feels somewhat nauseated.  RN notified.  Deferred further ambulation/exercise today.     Follow Up Recommendations  Home health PT vs SNF     Does the patient have the potential to tolerate intense rehabilitation     Barriers to Discharge        Equipment Recommendations  None recommended by PT    Recommendations for Other Services    Frequency 7X/week   Plan Discharge plan needs to be updated    Precautions / Restrictions Precautions Precautions: None Restrictions Weight Bearing Restrictions: No   Pertinent Vitals/Pain Increased pain in hip, and states he feels like he has a fever/getting sick.    Mobility  Bed Mobility Bed Mobility: Supine to Sit;Sit to Supine Supine to Sit: HOB elevated;4: Min assist;With rails Sit to Supine: 4: Min assist Details for Bed Mobility Assistance: Assist for LLE into and out of bed.  continue to provide cues, however pt had increased pain this pm.  Transfers Transfers: Sit to Stand;Stand to Sit Sit to Stand: 5: Supervision;With upper extremity assist;From bed Stand to Sit: 5: Supervision;With upper extremity assist;To bed Details for Transfer Assistance: Min cues for hand placement and LE management when sitting/standing.  Ambulation/Gait Ambulation/Gait Assistance: 5: Supervision Ambulation Distance (Feet): 15 Feet (x2) Assistive device: Rolling walker Ambulation/Gait Assistance Details: Min cues for positioning inside of RW.  Gait Pattern: Step-to pattern;Decreased stance time - left;Decreased step length - left Gait velocity: decreased    Exercises     PT Diagnosis:    PT Problem  List:   PT Treatment Interventions:     PT Goals Acute Rehab PT Goals PT Goal Formulation: With patient Time For Goal Achievement: 12/21/12 Potential to Achieve Goals: Good Pt will go Supine/Side to Sit: with modified independence PT Goal: Supine/Side to Sit - Progress: Progressing toward goal Pt will go Sit to Supine/Side: with modified independence PT Goal: Sit to Supine/Side - Progress: Progressing toward goal Pt will go Sit to Stand: with modified independence PT Goal: Sit to Stand - Progress: Progressing toward goal Pt will go Stand to Sit: with modified independence PT Goal: Stand to Sit - Progress: Progressing toward goal Pt will Ambulate: >150 feet;with modified independence;with rolling walker PT Goal: Ambulate - Progress: Progressing toward goal  Visit Information  Last PT Received On: 12/15/12 Assistance Needed: +1    Subjective Data  Subjective: I just dont feel right.  Patient Stated Goal: to go home   Cognition  Overall Cognitive Status: Appears within functional limits for tasks assessed/performed Arousal/Alertness: Awake/alert Orientation Level: Appears intact for tasks assessed Behavior During Session: [name] for tasks performed    Balance     End of Session PT - End of Session Activity Tolerance: Patient limited by fatigue;Patient limited by pain Patient left: in bed;with call bell/phone within reach;with nursing in room Nurse Communication: Mobility status   GP     Page, [name] 12/15/2012, 3:59 PM

## 2012-12-15 NOTE — Progress Notes (Signed)
Clinical Social Work Department BRIEF PSYCHOSOCIAL ASSESSMENT 12/15/2012  Patient:  [name], [name]     Account Number:  [id number]     Admit date:  12/13/2012  Clinical Social Worker:  Doroteo Glassman  Date/Time:  12/15/2012 12:30 PM  Referred by:  Physician  Date Referred:  12/15/2012 Referred for  SNF Placement   Other Referral:   Interview type:  Patient Other interview type:   Spoke with Pt's daughter via phone    PSYCHOSOCIAL DATA Living Status:  ALONE Admitted from facility:   Level of care:   Primary support name:  Mrs. Willeen Cass Primary support relationship to patient:  CHILD, ADULT Degree of support available:   adequate    CURRENT CONCERNS Current Concerns  Post-Acute Placement   Other Concerns:    SOCIAL WORK ASSESSMENT / PLAN Met with Pt to discuss d/c plans.    Pt was reluctant to agree to go to SNF but after talking with CSW and RN about ADLs, he agreed to allow CSW to perform a SNF search in [facility].    Pt inquired about [facility] services, asking, "Do they help with cooking and things like that?"  CSW discussed with Pt what HH entails and Pt stated, "They don't do what I need," referring to cooking and helping him to the bathroom.    Pt asked CSW to discuss the situation with his daughter and he stated that he'll make a decision with her.    CSW thanked Pt for his time.    Spoke with Pt's daughter via phone.    Discussed d/c options and daughter stated that she feels that Pt needs SNF.    CSW and daughter discussed SNF options in [place] and Pt's daughter hopeful for [facility], as it's close to her home.    CSW thanked Pt's daughter for her time.   Assessment/plan status:  Psychosocial Support/Ongoing Assessment of Needs Other assessment/ plan:   Information/referral to community resources:   -discussed via phone  -will give formal list with offers    PATIENT'S/FAMILY'S RESPONSE TO PLAN OF CARE: Pt and daughter thanked CSW for time  and assistance.   CSW to continue to follow.  Providence Crosby, LCSWA Clinical Social Work [phone]

## 2012-12-16 ENCOUNTER — Encounter (HOSPITAL_COMMUNITY): Payer: Self-pay | Admitting: Orthopaedic Surgery

## 2012-12-16 ENCOUNTER — Inpatient Hospital Stay (HOSPITAL_COMMUNITY): Payer: Medicare Other

## 2012-12-16 LAB — GLUCOSE, CAPILLARY
Glucose-Capillary: 113 mg/dL — ABNORMAL HIGH (ref 70–99)
Glucose-Capillary: 86 mg/dL (ref 70–99)

## 2012-12-16 LAB — CBC
HCT: 32.3 % — ABNORMAL LOW (ref 39.0–52.0)
MCH: 31.5 pg (ref 26.0–34.0)
MCV: 91.8 fL (ref 78.0–100.0)
Platelets: 133 10*3/uL — ABNORMAL LOW (ref 150–400)
RBC: 3.52 MIL/uL — ABNORMAL LOW (ref 4.22–5.81)

## 2012-12-16 MED ORDER — ALBUTEROL (5 MG/ML) CONTINUOUS INHALATION SOLN
2.5000 mg/h | INHALATION_SOLUTION | Freq: Four times a day (QID) | RESPIRATORY_TRACT | Status: DC
  Filled 2012-12-16: qty 20

## 2012-12-16 MED ORDER — ALBUTEROL SULFATE (5 MG/ML) 0.5% IN NEBU: 2.5000 mg | INHALATION_SOLUTION | RESPIRATORY_TRACT | Status: DC | PRN

## 2012-12-16 MED ORDER — IOHEXOL 350 MG/ML SOLN
100.0000 mL | Freq: Once | INTRAVENOUS | Status: AC | PRN
Start: 2012-12-16 — End: 2012-12-16
  Administered 2012-12-16: 100 mL via INTRAVENOUS

## 2012-12-16 MED ORDER — IPRATROPIUM BROMIDE 0.02 % IN SOLN
0.5000 mg | RESPIRATORY_TRACT | Status: DC
  Administered 2012-12-16 – 2012-12-17 (×3): 0.5 mg via RESPIRATORY_TRACT
  Filled 2012-12-16 (×4): qty 2.5

## 2012-12-16 MED ORDER — LEVOFLOXACIN IN D5W 750 MG/150ML IV SOLN
750.0000 mg | INTRAVENOUS | Status: DC
  Administered 2012-12-16 – 2012-12-17 (×2): 750 mg via INTRAVENOUS
  Filled 2012-12-16 (×2): qty 150

## 2012-12-16 MED ORDER — ALBUTEROL SULFATE (5 MG/ML) 0.5% IN NEBU
2.5000 mg | INHALATION_SOLUTION | RESPIRATORY_TRACT | Status: DC
  Administered 2012-12-16 – 2012-12-17 (×3): 2.5 mg via RESPIRATORY_TRACT
  Filled 2012-12-16 (×4): qty 0.5

## 2012-12-16 NOTE — Progress Notes (Signed)
Subjective: 3 Days Post-Op Procedure(s) (LRB): TOTAL HIP ARTHROPLASTY ANTERIOR APPROACH (Left) Patient reports pain as moderate.  CT scan of chest obtained earlier this am due to decreased sats and tachycardia.  Negative for PE does does show signs consistent with pneumonia.  Hgb looks good and WBC normal.  Objective: Vital signs in last 24 hours: Temp:  [97.9 F (36.6 C)-100.5 F (38.1 C)] 99.7 F (37.6 C) (12/23 0641) Pulse Rate:  [89-140] 119  (12/23 0641) Resp:  [16-22] 18  (12/23 0641) BP: (103-130)/(51-77) 110/75 mmHg (12/23 0641) SpO2:  [88 %-98 %] 97 % (12/23 0641)  Intake/Output from previous day: 12/22 0701 - 12/23 0700 In: 740 [P.O.:740] Out: 600 [Urine:600] Intake/Output this shift:     Basename 12/16/12 0409 12/15/12 0430 12/14/12 0427  HGB 11.1* 10.4* 11.3*    Basename 12/16/12 0409 12/15/12 0430  WBC 8.3 10.4  RBC 3.52* 3.38*  HCT 32.3* 31.2*  PLT 133* 127*    Basename 12/14/12 0427  NA 136  K 4.1  CL 103  CO2 28  BUN 21  CREATININE 1.11  GLUCOSE 101*  CALCIUM 8.8   No results found for this basename: LABPT:2,INR:2 in the last 72 hours  Sensation intact distally Intact pulses distally Dorsiflexion/Plantar flexion intact Incision: dressing C/D/I No cellulitis present  Assessment/Plan: 3 Days Post-Op Procedure(s) (LRB): TOTAL HIP ARTHROPLASTY ANTERIOR APPROACH (Left) Discharge to SNF tomorrow. Will need to start IV antibiotics today to treat pneumonia and transition to oral antibiotics for 10 days.  [name] Y 12/16/2012, 7:18 AM

## 2012-12-16 NOTE — Progress Notes (Signed)
Called RT Cordelia Pen) to have pt evaluated for appropriate neb treatments. Will call Magnus Ivan, MD when completed.

## 2012-12-16 NOTE — Progress Notes (Signed)
Occupational Therapy Treatment Patient Details Name: [name] MRN: [id number] DOB: Aug 24, 1946 Today's Date: 12/16/2012 Time: 4098-1191 OT Time Calculation (min): 27 min  OT Assessment / Plan / Recommendation Comments on Treatment Session pt feeling sick today . Pt very participative with OT but reports feeling like he needs to have a BM and is feeling like he is going to throw up    Follow Up Recommendations  Home health OT       Equipment Recommendations  3 in 1 bedside comode       Frequency Min 2X/week   Plan Discharge plan remains appropriate    Precautions / Restrictions Restrictions Weight Bearing Restrictions: No       ADL  Grooming: Performed;Wash/dry hands;Wash/dry face Where Assessed - Grooming: Unsupported standing Lower Body Dressing: Performed;Minimal assistance Where Assessed - Lower Body Dressing: Unsupported sit to stand Toilet Transfer: Performed;Min guard;Other (comment) (walked to bathroom) Toilet Transfer Method: Sit to stand Toilet Transfer Equipment: Raised toilet seat with arms (or 3-in-1 over toilet) Toileting - Clothing Manipulation and Hygiene: Performed;Minimal assistance Where Assessed - Toileting Clothing Manipulation and Hygiene: Standing Transfers/Ambulation Related to ADLs: Educated in using AE in which pt appreciative and feels this will be beneficial.      OT Goals ADL Goals ADL Goal: Lower Body Dressing - Progress: Progressing toward goals ADL Goal: Toilet Transfer - Progress: Progressing toward goals ADL Goal: Toileting - Clothing Manipulation - Progress: Progressing toward goals  Visit Information  Last OT Received On: 12/16/12    Subjective Data  Subjective: I have been sick all night.        Cognition  Overall Cognitive Status: Appears within functional limits for tasks assessed/performed Arousal/Alertness: Awake/alert Orientation Level: Appears intact for tasks assessed Behavior During Session: [facility] for tasks performed     Mobility  Shoulder Instructions Bed Mobility Bed Mobility: Supine to Sit Supine to Sit: 4: Min guard;HOB flat;With rails Transfers Transfers: Sit to Stand;Stand to Sit Sit to Stand: 4: Min guard;From bed;From toilet Stand to Sit: 4: Min guard;To toilet;To chair/3-in-1 Details for Transfer Assistance: Verbal cues for pushing up with arms and not pulling on walker             End of Session OT - End of Session Activity Tolerance: Patient tolerated treatment well  GO     Alba Cory 12/16/2012, 8:24 AM

## 2012-12-16 NOTE — Progress Notes (Signed)
PT Cancellation Note  Patient Details Name: [name] MRN: [id number] DOB: 1946-11-21   Cancelled Treatment:    Reason Eval/Treat Not Completed: Fatigue/lethargy limiting ability to participate;Other (comment) (Pt feeling nauseated and with chills/fever).  Will check on pt tomorrow.    Page, [name] 12/16/2012, 4:32 PM

## 2012-12-16 NOTE — Progress Notes (Signed)
Patient has a bed at [facility] when medically stable for discharge.   Dr. Magnus Ivan - please sign FL2 in shadow chart.   CSW to follow-up tomorrow.   Unice Bailey, LCSW [facility] Clinical Social Worker cell #: [phone]

## 2012-12-16 NOTE — Progress Notes (Signed)
Late entry. Patient noted to be extremely SOB when ambulating to bathroom. Pulse in the 130-140 range. Patient sweaty and light headed. Complains of nausea. O2 sats 93% on 2L [place]. Desats to 78-84 on RA. Dr Lajoyce Corners is on call and notified. STAT CT ordered. Will continue to closely monitor. [address] [place], [place] 12/16/2012 [phone]

## 2012-12-17 ENCOUNTER — Inpatient Hospital Stay (HOSPITAL_COMMUNITY): Payer: Medicare Other

## 2012-12-17 LAB — CBC
HCT: 32.6 % — ABNORMAL LOW (ref 39.0–52.0)
Platelets: 153 10*3/uL (ref 150–400)
RBC: 3.53 MIL/uL — ABNORMAL LOW (ref 4.22–5.81)
RDW: 14.1 % (ref 11.5–15.5)
WBC: 7 10*3/uL (ref 4.0–10.5)

## 2012-12-17 LAB — GLUCOSE, CAPILLARY
Glucose-Capillary: 115 mg/dL — ABNORMAL HIGH (ref 70–99)
Glucose-Capillary: 106 mg/dL — ABNORMAL HIGH (ref 70–99)

## 2012-12-17 LAB — BASIC METABOLIC PANEL
BUN: 17 mg/dL (ref 6–23)
CO2: 26 mEq/L (ref 19–32)
Chloride: 102 mEq/L (ref 96–112)
GFR calc Af Amer: >90 mL/min (ref >90)
Potassium: 3.8 mEq/L (ref 3.5–5.1)

## 2012-12-17 MED ORDER — LEVOFLOXACIN 750 MG PO TABS: 750.0000 mg | ORAL_TABLET | Freq: Every day | ORAL | Status: DC

## 2012-12-17 MED ORDER — MORPHINE SULFATE 15 MG PO TABS: 15.0000 mg | ORAL_TABLET | Freq: Four times a day (QID) | ORAL | Status: DC | PRN

## 2012-12-17 MED ORDER — ASPIRIN-ACETAMINOPHEN-CAFFEINE 250-250-65 MG PO TABS
2.0000 | ORAL_TABLET | Freq: Three times a day (TID) | ORAL | Status: DC | PRN
  Administered 2012-12-17: 2 via ORAL
  Filled 2012-12-17 (×2): qty 2

## 2012-12-17 MED ORDER — ASPIRIN EC 325 MG PO TBEC: 325.0000 mg | DELAYED_RELEASE_TABLET | Freq: Every day | ORAL | Status: DC

## 2012-12-17 MED ORDER — OXYCODONE-ACETAMINOPHEN 5-325 MG PO TABS: 1.0000 | ORAL_TABLET | ORAL | Status: AC | PRN

## 2012-12-17 MED ORDER — METHOCARBAMOL 500 MG PO TABS: 500.0000 mg | ORAL_TABLET | Freq: Four times a day (QID) | ORAL | Status: DC | PRN

## 2012-12-17 NOTE — Care Management Note (Signed)
    Page 1 of 1   12/17/2012     10:46:14 AM   CARE MANAGEMENT NOTE 12/17/2012  Patient:  [name], [name]   Account Number:  [id number]  Date Initiated:  12/17/2012  Documentation initiated by:  Lorenda Ishihara  Subjective/Objective Assessment:   66 yo male admitted s/p left THA. PTA lived at home, requiring SNF at d/c.     Action/Plan:   To SNF when stable   Anticipated DC Date:  12/17/2012   Anticipated DC Plan:  SKILLED NURSING FACILITY  In-house referral  Clinical Social Worker      DC Planning Services  CM consult      Choice offered to / List presented to:             Status of service:  Completed, signed off Medicare Important Message given?   (If response is "NO", the following Medicare IM given date fields will be blank) Date Medicare IM given:   Date Additional Medicare IM given:    Discharge Disposition:  SKILLED NURSING FACILITY  Per UR Regulation:  Reviewed for med. necessity/level of care/duration of stay  If discussed at Long Length of Stay Meetings, dates discussed:    Comments:

## 2012-12-17 NOTE — Progress Notes (Signed)
Report called and given to Earle Gell at [facility], Myrtie Hawk RN 12-17-2012 11:26am

## 2012-12-17 NOTE — Progress Notes (Signed)
Patient is set to discharge to [facility] SNF today. Patient aware. [facility] called for transport.   Clinical Social Work Department CLINICAL SOCIAL WORK PLACEMENT NOTE 12/17/2012  Patient:  [name], [name]  Account Number:  [id number] Admit date:  12/13/2012  Clinical Social Worker:  Doroteo Glassman  Date/time:  12/15/2012 12:28 PM  Clinical Social Work is seeking post-discharge placement for this patient at the following level of care:   SKILLED NURSING   (*CSW will update this form in Epic as items are completed)     Patient/family provided with [facility] Department of Clinical Social Work's list of facilities offering this level of care within the geographic area requested by the patient (or if unable, by the patient's family).    Patient/family informed of their freedom to choose among providers that offer the needed level of care, that participate in Medicare, Medicaid or managed care program needed by the patient, have an available bed and are willing to accept the patient.    Patient/family informed of [facility]' ownership interest in [facility], as well as of the fact that they are under no obligation to receive care at this facility.  PASARR submitted to EDS on 12/15/2012 PASARR number received from EDS on 12/15/2012  FL2 transmitted to all facilities in geographic area requested by pt/family on  12/15/2012 FL2 transmitted to all facilities within larger geographic area on   Patient informed that his/her managed care company has contracts with or will negotiate with  certain facilities, including the following:     Patient/family informed of bed offers received:  12/16/2012 Patient chooses bed at [facility] Physician recommends and patient chooses bed at    Patient to be transferred to [facility] on  12/17/2012 Patient to be transferred to facility by [facility]  The following physician request were entered in  Epic:   Additional Comments:  Unice Bailey, LCSW [facility] Clinical Social Worker cell #: [phone]

## 2012-12-17 NOTE — Progress Notes (Signed)
Physical Therapy Treatment Patient Details Name: [name] MRN: [id number] DOB: 10-14-1946 Today's Date: 12/17/2012 Time: 4098-1191 PT Time Calculation (min): 18 min  PT Assessment / Plan / Recommendation Comments on Treatment Session  Pt continues to have increased pain from headache and some nausea when ambulating.  Agreed to do some seated exercise.  Pt to D/C today to SNF.    Follow Up Recommendations  SNF     Does the patient have the potential to tolerate intense rehabilitation     Barriers to Discharge        Equipment Recommendations  None recommended by PT    Recommendations for Other Services    Frequency 7X/week   Plan Discharge plan needs to be updated    Precautions / Restrictions Precautions Precautions: None Restrictions Weight Bearing Restrictions: No Other Position/Activity Restrictions: WBAT   Pertinent Vitals/Pain 8/10 headache, RN notified.     Mobility  Bed Mobility Bed Mobility: Not assessed Transfers Transfers: Sit to Stand;Stand to Sit Sit to Stand: 4: Min guard;With upper extremity assist;With armrests;From chair/3-in-1 Stand to Sit: 4: Min guard;To chair/3-in-1;With armrests Details for Transfer Assistance: Min cues for hand placement and safety when sitting/standing.  Ambulation/Gait Ambulation/Gait Assistance: 4: Min guard Ambulation Distance (Feet): 200 Feet Assistive device: Rolling walker Ambulation/Gait Assistance Details: Min cues for upright and relaxed posture and keeping position inside of RW.  Gait Pattern: Step-to pattern;Decreased stance time - left;Decreased step length - left Gait velocity: decreased    Exercises Total Joint Exercises Ankle Circles/Pumps: AROM;Both;20 reps Quad Sets: AROM;Left;10 reps Heel Slides: AAROM;Left;10 reps   PT Diagnosis:    PT Problem List:   PT Treatment Interventions:     PT Goals Acute Rehab PT Goals PT Goal Formulation: With patient Time For Goal Achievement: 12/21/12 Potential to  Achieve Goals: Good Pt will go Sit to Stand: with modified independence PT Goal: Sit to Stand - Progress: Progressing toward goal Pt will go Stand to Sit: with modified independence PT Goal: Stand to Sit - Progress: Progressing toward goal Pt will Ambulate: >150 feet;with modified independence;with rolling walker PT Goal: Ambulate - Progress: Progressing toward goal Pt will Perform Home Exercise Program: Independently PT Goal: Perform Home Exercise Program - Progress: Progressing toward goal  Visit Information  Last PT Received On: 12/17/12 Assistance Needed: +1    Subjective Data  Subjective: My head is still killing me.  Patient Stated Goal: to go home   Cognition  Overall Cognitive Status: Appears within functional limits for tasks assessed/performed Arousal/Alertness: Awake/alert Orientation Level: Appears intact for tasks assessed Behavior During Session: [facility] for tasks performed    Balance     End of Session PT - End of Session Activity Tolerance: Patient limited by fatigue;Patient limited by pain Patient left: in chair;with call bell/phone within reach;with nursing in room Nurse Communication: Mobility status;Patient requests pain meds   GP     Page, [name] 12/17/2012, 9:12 AM

## 2012-12-17 NOTE — Discharge Summary (Signed)
Patient ID: [name] MRN: [id number] DOB/AGE: 01/26/1946 66 y.o.  Admit date: 12/13/2012 Discharge date: 12/17/2012  Admission Diagnoses:  Principal Problem:  *Avascular necrosis of hip   Discharge Diagnoses:  Same  Past Medical History  Diagnosis Date  . Atrial fibrillation   . COPD (chronic obstructive pulmonary disease)     mild PFT abnormalities; normal ABG  . Tobacco abuse     45 pack years  . Orthostatic hypotension     lightheadness; no definate loss of consciousness  . Splenic abscess 07/2009  . Diabetes mellitus     type 2  . Obesity   . Pedal edema   . Glaucoma(365)   . Obstructive sleep apnea     treated with CPAP  . GERD (gastroesophageal reflux disease)   . Degenerative disc disease, lumbar     HNP of the LS spine  . Colonic polyp   . Avascular necrosis of bones of both hips   . Hx MRSA infection 2010    sith splenic abcess  . Non Hodgkin's lymphoma 06/2009    chemotherapy until 11/2010  . Cancer   . H/O hiatal hernia   . 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 Complication of anesthesia     hard to wake up, "felt like died and revived me"  . PONV (postoperative nausea and vomiting)     Surgeries: Procedure(s): TOTAL HIP ARTHROPLASTY ANTERIOR APPROACH on 12/13/2012   Consultants:    Discharged Condition: Improved  Hospital Course: [name] is an 66 y.o. male who was admitted 12/13/2012 for operative treatment ofAvascular necrosis of hip. Patient has severe unremitting pain that affects sleep, daily activities, and work/hobbies. After pre-op clearance the patient was taken to the operating room on 12/13/2012 and underwent  Procedure(s): TOTAL HIP ARTHROPLASTY ANTERIOR APPROACH.    Patient was given perioperative antibiotics: Anti-infectives     Start     Dose/Rate Route Frequency Ordered Stop   12/17/12 0000   levofloxacin (LEVAQUIN) 750 MG tablet        750 mg Oral Daily 12/17/12 0805     12/16/12 0800   levofloxacin (LEVAQUIN) IVPB  750 mg        750 mg 100 mL/hr over 90 Minutes Intravenous Every 24 hours 12/16/12 0718     12/13/12 1800   ceFAZolin (ANCEF) IVPB 1 g/50 mL premix        1 g 100 mL/hr over 30 Minutes Intravenous Every 6 hours 12/13/12 1637 12/14/12 0043   12/13/12 0930   ceFAZolin (ANCEF) IVPB 2 g/50 mL premix        2 g 100 mL/hr over 30 Minutes Intravenous 60 min pre-op 12/13/12 0930 12/13/12 1210           Patient was given sequential compression devices, early ambulation, and chemoprophylaxis to prevent DVT.  Patient benefited maximally from hospital stay and there were no complications.  He was found to have questionable pneumonia on a post-op chest CT due to tachycardia and low sats.  He never had a high white blood cell count and responded well to Levaquin to treat him for this.  Recent vital signs: Patient Vitals for the past 24 hrs:  BP Temp Temp src Pulse Resp SpO2  12/17/12 0556 117/71 mmHg 98.9 F (37.2 C) Oral 112  20  94 %  12/17/12 0047 - 98.9 F (37.2 C) Oral - - -  12/16/12 2348 - 100.5 F (38.1 C) Oral - - -  12-25-12 2315 - - - - - 93 %  12-25-12 2246 - 101.7 F (38.7 C) Oral - - -  12-25-12 2220 - - - 100  19  94 %  Dec 25, 2012 2145 116/74 mmHg 103 F (39.4 C) Oral 121  18  94 %  12-25-12 2032 - - - - - 95 %  12/25/12 1534 - - - - - 98 %  December 25, 2012 1523 - - - - 16  96 %  12/25/12 1400 105/68 mmHg 98.4 F (36.9 C) Oral 93  18  98 %  December 25, 2012 1200 - - - - 16  94 %     Recent laboratory studies:  Basename 12/17/12 0440 25-Dec-2012 0409  WBC 7.0 8.3  HGB 11.0* 11.1*  HCT 32.6* 32.3*  PLT 153 133*  NA 139 --  K 3.8 --  CL 102 --  CO2 26 --  BUN 17 --  CREATININE 0.91 --  GLUCOSE 111* --  INR -- --  CALCIUM 9.2 --     Discharge Medications:     Medication List     As of 12/17/2012  8:06 AM    TAKE these medications         albuterol 108 (90 BASE) MCG/ACT inhaler   Commonly known as: PROVENTIL HFA;VENTOLIN HFA   Inhale 2 puffs into the lungs every 6 (six)  hours as needed. Wheezing      albuterol 0.63 MG/3ML nebulizer solution   Commonly known as: ACCUNEB   Take 1 ampule by nebulization every 6 (six) hours as needed. Wheezing      aspirin EC 325 MG tablet   Take 1 tablet (325 mg total) by mouth daily.      clotrimazole-betamethasone cream   Commonly known as: LOTRISONE   Apply topically 2 (two) times daily.      diltiazem 240 MG 24 hr tablet   Commonly known as: CARDIZEM LA   Take 240 mg by mouth daily before breakfast.      levofloxacin 750 MG tablet   Commonly known as: LEVAQUIN   Take 1 tablet (750 mg total) by mouth daily.      metFORMIN 500 MG tablet   Commonly known as: GLUCOPHAGE   Take 500 mg by mouth 2 (two) times daily.      methocarbamol 500 MG tablet   Commonly known as: ROBAXIN   Take 1 tablet (500 mg total) by mouth every 6 (six) hours as needed.      morphine 15 MG tablet   Commonly known as: MSIR   Take 1 tablet (15 mg total) by mouth 4 (four) times daily as needed. Pain      oxyCODONE-acetaminophen 5-325 MG per tablet   Commonly known as: PERCOCET/ROXICET   Take 1-2 tablets by mouth every 4 (four) hours as needed for pain.      senna 8.6 MG tablet   Commonly known as: SENOKOT   Take 1 tablet by mouth daily as needed. Constipation      Travoprost (BAK Free) 0.004 % Soln ophthalmic solution   Commonly known as: TRAVATAN   Place 1 drop into both eyes at bedtime.        Diagnostic Studies: Dg Hip Complete Left  12/13/2012  *RADIOLOGY REPORT*  Clinical Data: Left anterior approach total hip replacement  DG C-ARM 61-120 MIN - NRPT [facility],LEFT HIP - COMPLETE 2+ VIEW  Comparison: None.  Findings:  2 spot intraoperative radiographic images of the pelvis and left hip are provided for review.  Images demonstrate the sequela of a left total hip replacement. Alignment appears near anatomic on this solitary AP projection.  No definite fracture.  There is a minimal amount of expected subcutaneous emphysema about the  operative site. A linear opacity overlying the left ischium is likely external to the patient.  No definite radiopaque foreign body.  IMPRESSION: Post left total hip replacement without definite complicating feature.   Original Report Authenticated By: Tacey Ruiz, MD    Ct Angio Chest Pe W/cm &/or Wo Cm  12/16/2012  *RADIOLOGY REPORT*  Clinical Data: Decreased O2 saturation, shortness of breath and tachycardia.  History of smoking.  History of non-Hodgkins lymphoma.  CT ANGIOGRAPHY CHEST  Technique:  Multidetector CT imaging of the chest using the standard protocol during bolus administration of intravenous contrast. Multiplanar reconstructed images including MIPs were obtained and reviewed to evaluate the vascular anatomy.  Contrast: OMNIPAQUE IOHEXOL 350 MG/ML SOLN  Comparison: PET / CT performed 08/19/2012, and chest radiograph performed 07/15/2012  Findings: There is no evidence of pulmonary embolus.  Mild patchy airspace opacities are seen within the left upper lobe, and minimal peripheral opacities are noted in the right upper lobe and left lower lobe.  Findings compatible with mild multifocal pneumonia.  There is no evidence of pleural effusion or pneumothorax.  Scarring is noted at the right lung apex, with mild associated emphysematous change.  No masses are identified; no abnormal focal contrast enhancement is seen.  Mild diffuse esophageal wall thickening is noted, possibly reflecting prior treatment or mild esophagitis.  This is similar in appearance to the prior study.  The mediastinum is otherwise unremarkable in appearance.  No pericardial effusion is identified. No mediastinal lymphadenopathy is seen.  The great vessels are grossly unremarkable in appearance.  No axillary lymphadenopathy is seen.  The visualized portions of the thyroid gland are unremarkable in appearance.  The visualized portions of the liver and spleen are unremarkable.  No acute osseous abnormalities are seen.   IMPRESSION:  1.  No evidence of pulmonary embolus. 2.  Mild patchy airspace opacities within the left upper lobe, and minimal peripheral opacities in the right upper lobe and left lower lobe, compatible with multifocal pneumonia. 3.  Scarring at the right lung apex, with mild associated emphysema. 4.  Stable mild diffuse esophageal wall thickening may reflect prior treatment or mild esophagitis.   Original Report Authenticated By: Tonia Ghent, M.D.    Dg Pelvis Portable  12/13/2012  *RADIOLOGY REPORT*  Clinical Data: Postop  PORTABLE PELVIS  Comparison: 11/14/2011  Findings: Left total hip arthroplasty has been performed.  Is anatomic alignment of the osseous and prosthetic structures.  No breakage or loosening of the hardware.  Avascular necrosis in the right femoral and stable.  IMPRESSION: Left total hip arthroplasty anatomically aligned.   Original Report Authenticated By: Jolaine Click, M.D.    Dg Chest Port 1 View  12/17/2012  *RADIOLOGY REPORT*  Clinical Data: Pneumonia.  PORTABLE CHEST - 1 VIEW  Comparison: 12/16/2012 CT.  07/15/2022 chest x-ray.  Findings: CT detected on left upper lobe patchy air space disease not well delineated on the present plain film examination.  Central pulmonary vascular prominence without pulmonary edema. Minimal peribronchial thickening unchanged.  No gross pneumothorax.  Heart size within normal limits.  Partially calcified thoracic aorta.  IMPRESSION: CT detected on left upper lobe patchy air space disease not well delineated on the present plain film examination.   Original Report Authenticated By: Lacy Duverney, M.D.  Dg Hip Portable 1 View Left  12/13/2012  *RADIOLOGY REPORT*  Clinical Data: Postoperative  PORTABLE LEFT HIP - 1 VIEW  Comparison: None.  Findings: Left total hip arthroplasty is in place.  Anatomic alignment of the osseous and prosthetic structures.  No breakage or loosening of the hardware.  IMPRESSION: Left total hip arthroplasty anatomically  aligned.   Original Report Authenticated By: Jolaine Click, M.D.    Dg C-arm 61-120 Min-no Report  12/13/2012  *RADIOLOGY REPORT*  Clinical Data: Left anterior approach total hip replacement  DG C-ARM 61-120 MIN - NRPT [facility],LEFT HIP - COMPLETE 2+ VIEW  Comparison: None.  Findings:  2 spot intraoperative radiographic images of the pelvis and left hip are provided for review.  Images demonstrate the sequela of a left total hip replacement. Alignment appears near anatomic on this solitary AP projection.  No definite fracture.  There is a minimal amount of expected subcutaneous emphysema about the operative site. A linear opacity overlying the left ischium is likely external to the patient.  No definite radiopaque foreign body.  IMPRESSION: Post left total hip replacement without definite complicating feature.   Original Report Authenticated By: Tacey Ruiz, MD     Disposition: to skilled nursing facility      Discharge Orders    Future Appointments: Provider: Department: Dept Phone: Center:   02/19/2013 10:10 AM [facility] [phone] None   02/21/2013 11:30 AM Ellouise Newer, PA [facility] [phone] None     Future Orders Please Complete By Expires   Diet - low sodium heart healthy      Call MD / Call 911      Comments:   If you experience chest pain or shortness of breath, CALL 911 and be transported to the hospital emergency room.  If you develope a fever above 101 F, pus (white drainage) or increased drainage or redness at the wound, or calf pain, call your surgeon's office.   Constipation Prevention      Comments:   Drink plenty of fluids.  Prune juice may be helpful.  You may use a stool softener, such as Colace (over the counter) 100 mg twice a day.  Use MiraLax (over the counter) for constipation as needed.   Increase activity slowly as tolerated      Discharge instructions      Comments:   Increase activities as comfort allows. Full weight  as tolerated left hip.  No hip precautions. Ice as needed left thigh. Can get incision wet in the shower and place new dry dressing daily.   Discharge patient         Follow-up Information    Follow up with Kathryne Hitch, MD. In 2 weeks.   Contact information:   [address] [place] [address] [phone]           Signed: Kathryne Hitch 12/17/2012, 8:06 AM

## 2012-12-17 NOTE — Progress Notes (Signed)
Patient ID: [name], male   DOB: October 14, 1946, 66 y.o.   MRN: [id number] Looks really good this am.  Feels better.  Chest x-ray stable.  Labs all good.  Stable for discharge to SNF today.

## 2012-12-17 NOTE — Progress Notes (Signed)
Spoke to MD Magnus Ivan concerning patient complaining of migraine, order received for excedrin Means, Myrtie Hawk RN 12-17-2012 11:54am

## 2012-12-17 NOTE — Progress Notes (Signed)
Vital signs are stable, incision to left hip is clean dry and intact, latest CBG was 106 and patient required no coverage, patient complaining of migraine Magnus Ivan MD notified and Excedrin and zofran given for migraine Means, Myrtie Hawk RN 12-17-2012 12:23pm

## 2012-12-19 LAB — GLUCOSE, CAPILLARY

## 2013-02-19 ENCOUNTER — Other Ambulatory Visit (HOSPITAL_COMMUNITY): Payer: Medicare Other

## 2013-02-19 NOTE — Progress Notes (Signed)
Ryan Maudlin, MD [address] [place] [address]  NON-HODGKIN'S LYMPHOMA, HX OF - Plan: CBC with Differential, Comprehensive metabolic panel, Lactate dehydrogenase, Sedimentation rate, CBC with Differential, Comprehensive metabolic panel, Lactate dehydrogenase, Sedimentation rate, CT Chest W Contrast, CT Abdomen W Contrast  CURRENT THERAPY: Surveillance  INTERVAL HISTORY: [name] 67 y.o. 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 His PET scan on 08/19/2012 shows no evidence for recurrent disease.  [name] is here for followup. He was admitted to the hospital in December for a left hip arthroplasty by Dr. Magnus Sharp. He is healed up nicely from that. He has completed rehabilitation for that as well.  [name] reports constant abdominal pain that is tight in nature and stinging. He reports is been present ever since his diagnosis and treatment of cancer. He also admits to nausea and vomiting which too is a chronic issue. He also notes diarrhea on an every other day frequency. He has a number of GI issues going on that he reports this been present since his diagnosis and treatment of non-Hodgkin's lymphoma. I've offered him a GI consultation and he has declined. He is worried about "pancreatitis". He reports that his abdominal pain, nausea, and vomiting worsens with eating. That seems to be the pattern. Most signs he is able to avoid this, but occasionally he does have emeses. I will see if we can add amylase and lipase of laboratory work from today. He reports that if he cannot be added on from the lab draw today he does not want to be stuck again.  I explained to [name] the future plan which will include laboratory work in 6 months followed by a CT surveillance. He has declined to have annual CT  surveillance at this point time. He reports that his financial situation is tight.   As a result, we'll hold off on performing a CT scan in 6 months. We will perform laboratory work in 6 months and if they're any abnormalities we can certainly pursue surveillance scans at that time.   Hematologically, the patient does admit to night sweats but these are associated with his nausea and vomiting episodes. He denies any fevers or chills. His appetite is strong and his weight has been stable. Otherwise, he denies any complaints of ROS questioning is negative  Past Medical History  Diagnosis Date  . Atrial fibrillation   . COPD (chronic obstructive pulmonary disease)     mild PFT abnormalities; normal ABG  . Tobacco abuse     45 pack years  . Orthostatic hypotension     lightheadness; no definate loss of consciousness  . Splenic abscess 07/2009  . Diabetes mellitus     type 2  . Obesity   . Pedal edema   . Glaucoma(365)   . Obstructive sleep apnea     treated with CPAP  . GERD (gastroesophageal reflux disease)   . Degenerative disc disease, lumbar     HNP of the LS spine  . Colonic polyp   . Avascular necrosis of bones of both hips   . Hx MRSA infection 2010    sith splenic abcess  . Non Hodgkin's lymphoma 06/2009    chemotherapy until 11/2010  . Cancer   . H/O hiatal hernia   . 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 Complication of anesthesia     hard to wake up, "felt like died and revived me"  . PONV (postoperative nausea and vomiting)     has DIABETES MELLITUS, TYPE II; OBESITY; TOBACCO ABUSE; Atrial fibrillation; GASTROESOPHAGEAL REFLUX DISEASE; NON-HODGKIN'S LYMPHOMA, HX OF; Glaucoma; COPD (chronic obstructive pulmonary disease); Orthostatic hypotension; Obstructive sleep apnea; Degenerative disc disease, lumbar; Colonic polyp; Acute exacerbation of COPD with asthma; and Avascular necrosis of hip on his problem list.     is allergic to codeine and pred forte.  [name] had no  medications administered during this visit.  Past Surgical History  Procedure Laterality Date  . Abscess drainage  2010    Splenic  . Colonoscopy w/ polypectomy  2008    with snare polypectomy  . Cataract extraction      Right with lens implant  . Lymph node biopsy  2010    done x 2 for NHL diagnosis  . Tonsillectomy    . Eye surgery      lens implant  . Knee arthroscopy      right  . Anal fissure repair  67years old  . Total hip arthroplasty  12/13/2012    Procedure: TOTAL HIP ARTHROPLASTY ANTERIOR APPROACH;  Surgeon: Ryan Hitch, MD;  Location: [facility];  Service: Orthopedics;  Laterality: Left;  Left Total Hip Arthroplasty, Anterior Approach (C-Arm)    Denies any headaches, dizziness, double vision, fevers, chills, night sweats, nausea, vomiting, diarrhea, constipation, chest pain, heart palpitations, shortness of breath, blood in stool, black tarry stool, urinary pain, urinary burning, urinary frequency, hematuria.   PHYSICAL EXAMINATION  ECOG PERFORMANCE STATUS: 1 - Symptomatic but completely ambulatory  Filed Vitals:   02/20/13 1000  BP: 127/69  Pulse: 52  Temp: 98.4 F (36.9 C)  Resp: 18    GENERAL:alert, no distress, well nourished, well developed, comfortable and cooperative SKIN: skin color, texture, turgor are normal, no rashes or significant lesions HEAD: Normocephalic, No masses, lesions, tenderness or abnormalities EYES: normal, Conjunctiva are pink and non-injected EARS: External ears normal OROPHARYNX:mucous membranes are moist  NECK: supple, no adenopathy, thyroid normal size, non-tender, without nodularity, no stridor, non-tender, trachea midline LYMPH:  no palpable lymphadenopathy, no hepatosplenomegaly BREAST:not examined LUNGS: clear to auscultation and percussion HEART: regular rate & rhythm, no murmurs, no gallops, S1 normal and S2 normal ABDOMEN:abdomen soft, obese, normal bowel sounds, mild tenderness in the epigastric region and no  hepatosplenomegaly BACK: Back symmetric, no curvature., No CVA tenderness EXTREMITIES:less then 2 second capillary refill, no joint deformities, effusion, or inflammation, no edema, no skin discoloration, no clubbing, no cyanosis  NEURO: alert & oriented x 3 with fluent speech, no focal motor/sensory deficits, gait normal    LABORATORY DATA: CBC    Component Value Date/Time   WBC 7.0 12/17/2012 0440   WBC 9.3 11/14/2011 0918   RBC 3.53* 12/17/2012 0440   RBC 4.73 11/14/2011 0918   HGB 11.0* 12/17/2012 0440   HGB 14.8 11/14/2011 0918   HCT 32.6* 12/17/2012 0440   HCT 44.2 11/14/2011 0918   PLT 153 12/17/2012 0440   PLT 178 11/14/2011 0918   MCV 92.4 12/17/2012 0440   MCV 93.6 11/14/2011 0918   MCH 31.2 12/17/2012 0440   MCH 31.2 11/14/2011 0918   MCHC 33.7 12/17/2012 0440   MCHC 33.3 11/14/2011 0918   RDW 14.1 12/17/2012 0440   RDW 14.6 11/14/2011 0918   LYMPHSABS 2.4 07/15/2012 1937   LYMPHSABS 2.6 11/14/2011 0918   MONOABS  0.9 07/15/2012 1937   MONOABS 0.6 11/14/2011 0918   EOSABS 0.2 07/15/2012 1937   EOSABS 0.2 11/14/2011 0918   BASOSABS 0.0 07/15/2012 1937   BASOSABS 0.0 11/14/2011 0918      Chemistry      Component Value Date/Time   NA 139 12/17/2012 0440   NA 139 11/14/2011 0919   K 3.8 12/17/2012 0440   K 4.4 11/14/2011 0919   CL 102 12/17/2012 0440   CL 102 11/14/2011 0919   CO2 26 12/17/2012 0440   CO2 28 11/14/2011 0919   BUN 17 12/17/2012 0440   BUN 17 11/14/2011 0919   CREATININE 0.91 12/17/2012 0440   CREATININE 0.9 11/14/2011 0919      Component Value Date/Time   CALCIUM 9.2 12/17/2012 0440   CALCIUM 8.6 11/14/2011 0919   ALKPHOS 59 01/16/2012 1700   ALKPHOS 55 11/14/2011 0919   AST 14 01/16/2012 1700   AST 22 11/14/2011 0919   ALT 18 01/16/2012 1700   BILITOT 0.3 01/16/2012 1700   BILITOT 0.50 11/14/2011 0919         ASSESSMENT:  1. 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 His PET scan on 08/19/2012 shows no evidence for recurrent disease.  2. Ringworm right foot, treated with Lotrisone twice a day for 2 weeks.  3. COPD  4. Chronic back pain left upper quadrant discomfort but he still does not want to see a pain clinic specialist.  5. Atrial fibrillation for which he has seen Dr. Dietrich Pates in the past  6. Skin cancer in the past  7. Bilateral avascular necrosis of the hips S/P left total hip arthroplasty by Dr. Allie Bossier on 12/14/2012.  8. 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 Nausea, vomiting, abdominal pain, diarrhea since diagnosis of cancer and its treatment in 2010.   PLAN:  1. I personally reviewed and went over laboratory results with the patient. 2. I personally reviewed and went over radiographic studies with the patient. 3. Chart reviewed 4. Labs today: CBC diff, CMET, LDH, ESR 5. Labs in 6 months: CBC diff, CMET, LDH, ESR 6. Patient has declined CT CAP with contrast in in 6 months for annual surveillance  7. Patient has declined GI referral for nausea, vomiting, diarrhea, and abdominal pain.  8. Return in 6 months following lab work.   All questions were answered. The patient knows to call the clinic with any problems, questions or concerns. We can certainly see the patient much sooner if necessary.  Patient and plan will be discussed with Dr. Mariel Sleet within the next 24 hours.    Sylvanna Burggraf

## 2013-02-20 ENCOUNTER — Encounter (HOSPITAL_COMMUNITY): Payer: Self-pay | Admitting: Oncology

## 2013-02-20 ENCOUNTER — Encounter (HOSPITAL_COMMUNITY): Payer: Medicare Other | Attending: Oncology | Admitting: Oncology

## 2013-02-20 ENCOUNTER — Encounter (HOSPITAL_BASED_OUTPATIENT_CLINIC_OR_DEPARTMENT_OTHER): Payer: Medicare Other

## 2013-02-20 VITALS — BP 127/69 | HR 52 | Temp 98.4°F | Resp 18 | Wt 241.2 lb

## 2013-02-20 DIAGNOSIS — Z87898 Personal history of other specified conditions: Secondary | ICD-10-CM

## 2013-02-20 DIAGNOSIS — J449 Chronic obstructive pulmonary disease, unspecified: Secondary | ICD-10-CM | POA: Insufficient documentation

## 2013-02-20 DIAGNOSIS — R112 Nausea with vomiting, unspecified: Secondary | ICD-10-CM

## 2013-02-20 DIAGNOSIS — C8589 Other specified types of non-Hodgkin lymphoma, extranodal and solid organ sites: Secondary | ICD-10-CM

## 2013-02-20 DIAGNOSIS — G8929 Other chronic pain: Secondary | ICD-10-CM | POA: Insufficient documentation

## 2013-02-20 DIAGNOSIS — J4489 Other specified chronic obstructive pulmonary disease: Secondary | ICD-10-CM | POA: Insufficient documentation

## 2013-02-20 DIAGNOSIS — I4891 Unspecified atrial fibrillation: Secondary | ICD-10-CM | POA: Insufficient documentation

## 2013-02-20 DIAGNOSIS — R197 Diarrhea, unspecified: Secondary | ICD-10-CM

## 2013-02-20 DIAGNOSIS — M549 Dorsalgia, unspecified: Secondary | ICD-10-CM | POA: Insufficient documentation

## 2013-02-20 DIAGNOSIS — B353 Tinea pedis: Secondary | ICD-10-CM | POA: Insufficient documentation

## 2013-02-20 DIAGNOSIS — R109 Unspecified abdominal pain: Secondary | ICD-10-CM

## 2013-02-20 DIAGNOSIS — Z09 Encounter for follow-up examination after completed treatment for conditions other than malignant neoplasm: Secondary | ICD-10-CM | POA: Insufficient documentation

## 2013-02-20 LAB — COMPREHENSIVE METABOLIC PANEL
Albumin: 3.9 g/dL (ref 3.5–5.2)
Alkaline Phosphatase: 70 U/L (ref 39–117)
BUN: 13 mg/dL (ref 6–23)
Creatinine, Ser: 0.92 mg/dL (ref 0.50–1.35)
GFR calc Af Amer: >90 mL/min (ref >90)
Glucose, Bld: 105 mg/dL — ABNORMAL HIGH (ref 70–99)
Potassium: 3.8 mEq/L (ref 3.5–5.1)
Total Bilirubin: 0.3 mg/dL (ref 0.3–1.2)
Total Protein: 7.2 g/dL (ref 6.0–8.3)

## 2013-02-20 LAB — SEDIMENTATION RATE: Sed Rate: 6 mm/hr (ref 0–16)

## 2013-02-20 LAB — CBC WITH DIFFERENTIAL/PLATELET
Basophils Relative: 0 % (ref 0–1)
Eosinophils Absolute: 0.1 10*3/uL (ref 0.0–0.7)
HCT: 45.3 % (ref 39.0–52.0)
Hemoglobin: 15.2 g/dL (ref 13.0–17.0)
Lymphs Abs: 3.1 10*3/uL (ref 0.7–4.0)
MCH: 31.1 pg (ref 26.0–34.0)
MCHC: 33.6 g/dL (ref 30.0–36.0)
MCV: 92.8 fL (ref 78.0–100.0)
Monocytes Absolute: 0.7 10*3/uL (ref 0.1–1.0)
Monocytes Relative: 7 % (ref 3–12)
RBC: 4.88 MIL/uL (ref 4.22–5.81)

## 2013-02-20 LAB — LIPASE, BLOOD: Lipase: 22 U/L (ref 11–59)

## 2013-02-20 LAB — LACTATE DEHYDROGENASE: LDH: 162 U/L (ref 94–250)

## 2013-02-20 NOTE — Progress Notes (Signed)
Labs drawn today for cbc/diff,cmp,ldh 

## 2013-02-20 NOTE — Addendum Note (Signed)
Addended by: Dennie Maizes on: 02/20/2013 11:03 AM   Modules accepted: Orders

## 2013-02-20 NOTE — Patient Instructions (Addendum)
[facility] Discharge Instructions  RECOMMENDATIONS MADE BY THE CONSULTANT AND ANY TEST RESULTS WILL BE SENT TO YOUR REFERRING PHYSICIAN.  We will schedule a CT scan in 6 months. Lab work a few days prior to CT scan. MD appointment after CT scan. Report any issues/concerns to clinic as needed prior to appointment.  Thank you for choosing [facility] to provide your oncology and hematology care.  To afford each patient quality time with our providers, please arrive at least 15 minutes before your scheduled appointment time.  With your help, our goal is to use those 15 minutes to complete the necessary work-up to ensure our physicians have the information they need to help with your evaluation and healthcare recommendations.    Effective January 1st, 2014, we ask that you re-schedule your appointment with our physicians should you arrive 10 or more minutes late for your appointment.  We strive to give you quality time with our providers, and arriving late affects you and other patients whose appointments are after yours.    Again, thank you for choosing [facility].  Our hope is that these requests will decrease the amount of time that you wait before being seen by our physicians.       _____________________________________________________________  Should you have questions after your visit to [facility], please contact our office at [phone] between the hours of 8:30 a.m. and 5:00 p.m.  Voicemails left after 4:30 p.m. will not be returned until the following business day.  For prescription refill requests, have your pharmacy contact our office with your prescription refill request.

## 2013-02-21 ENCOUNTER — Ambulatory Visit (HOSPITAL_COMMUNITY): Payer: Medicare Other | Admitting: Oncology

## 2013-06-13 NOTE — Telephone Encounter (Signed)
error 

## 2013-07-30 ENCOUNTER — Other Ambulatory Visit (HOSPITAL_COMMUNITY): Payer: Self-pay | Admitting: Pulmonary Disease

## 2013-07-30 DIAGNOSIS — R109 Unspecified abdominal pain: Secondary | ICD-10-CM

## 2013-08-07 ENCOUNTER — Ambulatory Visit (HOSPITAL_COMMUNITY): Payer: Medicare Other

## 2013-08-14 ENCOUNTER — Encounter (HOSPITAL_COMMUNITY): Payer: Self-pay

## 2013-08-14 ENCOUNTER — Ambulatory Visit (HOSPITAL_COMMUNITY)
Admission: RE | Admit: 2013-08-14 | Discharge: 2013-08-14 | Disposition: A | Discharge status: Home / Self Care | Payer: Medicare Other | Source: Ambulatory Visit | Attending: Pulmonary Disease | Admitting: Pulmonary Disease

## 2013-08-14 ENCOUNTER — Other Ambulatory Visit (HOSPITAL_COMMUNITY): Payer: Self-pay | Admitting: Pulmonary Disease

## 2013-08-14 DIAGNOSIS — K7689 Other specified diseases of liver: Secondary | ICD-10-CM | POA: Insufficient documentation

## 2013-08-14 DIAGNOSIS — I700 Atherosclerosis of aorta: Secondary | ICD-10-CM | POA: Insufficient documentation

## 2013-08-14 DIAGNOSIS — I2510 Atherosclerotic heart disease of native coronary artery without angina pectoris: Secondary | ICD-10-CM | POA: Insufficient documentation

## 2013-08-14 DIAGNOSIS — K5730 Diverticulosis of large intestine without perforation or abscess without bleeding: Secondary | ICD-10-CM | POA: Insufficient documentation

## 2013-08-14 DIAGNOSIS — Z96649 Presence of unspecified artificial hip joint: Secondary | ICD-10-CM | POA: Insufficient documentation

## 2013-08-14 DIAGNOSIS — M87059 Idiopathic aseptic necrosis of unspecified femur: Secondary | ICD-10-CM | POA: Insufficient documentation

## 2013-08-14 DIAGNOSIS — R109 Unspecified abdominal pain: Secondary | ICD-10-CM | POA: Insufficient documentation

## 2013-08-14 MED ORDER — IOHEXOL 300 MG/ML  SOLN
125.0000 mL | Freq: Once | INTRAMUSCULAR | Status: AC | PRN
  Administered 2013-08-14: 125 mL via INTRAVENOUS

## 2013-08-19 ENCOUNTER — Encounter (HOSPITAL_COMMUNITY): Payer: Medicare Other

## 2013-08-19 NOTE — Progress Notes (Signed)
Patient stated had labs and CT done for Dr [name].  Patient said he did not need labs here or see the dr

## 2013-08-22 ENCOUNTER — Ambulatory Visit (HOSPITAL_COMMUNITY): Payer: Medicare Other | Admitting: Oncology

## 2013-08-26 ENCOUNTER — Ambulatory Visit (HOSPITAL_COMMUNITY): Payer: Medicare Other | Admitting: Oncology

## 2015-06-14 DIAGNOSIS — E119 Type 2 diabetes mellitus without complications: Secondary | ICD-10-CM | POA: Diagnosis not present

## 2015-06-14 DIAGNOSIS — J449 Chronic obstructive pulmonary disease, unspecified: Secondary | ICD-10-CM | POA: Diagnosis not present

## 2015-06-14 DIAGNOSIS — B029 Zoster without complications: Secondary | ICD-10-CM | POA: Diagnosis not present

## 2015-08-11 DIAGNOSIS — I482 Chronic atrial fibrillation: Secondary | ICD-10-CM | POA: Diagnosis not present

## 2015-08-11 DIAGNOSIS — G4730 Sleep apnea, unspecified: Secondary | ICD-10-CM | POA: Diagnosis not present

## 2015-08-11 DIAGNOSIS — E119 Type 2 diabetes mellitus without complications: Secondary | ICD-10-CM | POA: Diagnosis not present

## 2015-08-11 DIAGNOSIS — H409 Unspecified glaucoma: Secondary | ICD-10-CM | POA: Diagnosis not present

## 2015-08-11 DIAGNOSIS — Z1211 Encounter for screening for malignant neoplasm of colon: Secondary | ICD-10-CM | POA: Diagnosis not present

## 2015-08-11 DIAGNOSIS — C8589 Other specified types of non-Hodgkin lymphoma, extranodal and solid organ sites: Secondary | ICD-10-CM | POA: Diagnosis not present

## 2015-08-11 DIAGNOSIS — Z0000 Encounter for general adult medical examination without abnormal findings: Secondary | ICD-10-CM | POA: Diagnosis not present

## 2016-03-13 ENCOUNTER — Other Ambulatory Visit (HOSPITAL_COMMUNITY): Payer: Self-pay | Admitting: Pulmonary Disease

## 2016-03-13 DIAGNOSIS — E119 Type 2 diabetes mellitus without complications: Secondary | ICD-10-CM | POA: Diagnosis not present

## 2016-03-13 DIAGNOSIS — M5135 Other intervertebral disc degeneration, thoracolumbar region: Secondary | ICD-10-CM | POA: Diagnosis not present

## 2016-03-13 DIAGNOSIS — K219 Gastro-esophageal reflux disease without esophagitis: Secondary | ICD-10-CM | POA: Diagnosis not present

## 2016-03-13 DIAGNOSIS — R109 Unspecified abdominal pain: Secondary | ICD-10-CM

## 2016-03-13 DIAGNOSIS — R110 Nausea: Secondary | ICD-10-CM

## 2016-03-13 DIAGNOSIS — J449 Chronic obstructive pulmonary disease, unspecified: Secondary | ICD-10-CM | POA: Diagnosis not present

## 2016-03-13 DIAGNOSIS — I482 Chronic atrial fibrillation: Secondary | ICD-10-CM | POA: Diagnosis not present

## 2016-03-16 ENCOUNTER — Ambulatory Visit (HOSPITAL_COMMUNITY)
Admission: RE | Admit: 2016-03-16 | Discharge: 2016-03-16 | Disposition: A | Discharge status: Home / Self Care | Payer: Medicare Other | Source: Ambulatory Visit | Attending: Pulmonary Disease | Admitting: Pulmonary Disease

## 2016-03-16 DIAGNOSIS — R109 Unspecified abdominal pain: Secondary | ICD-10-CM | POA: Diagnosis not present

## 2016-03-16 DIAGNOSIS — R110 Nausea: Secondary | ICD-10-CM

## 2016-03-16 DIAGNOSIS — R932 Abnormal findings on diagnostic imaging of liver and biliary tract: Secondary | ICD-10-CM | POA: Insufficient documentation

## 2016-05-18 DIAGNOSIS — Z1211 Encounter for screening for malignant neoplasm of colon: Secondary | ICD-10-CM | POA: Diagnosis not present

## 2016-05-18 DIAGNOSIS — Z0000 Encounter for general adult medical examination without abnormal findings: Secondary | ICD-10-CM | POA: Diagnosis not present

## 2016-07-17 ENCOUNTER — Other Ambulatory Visit (HOSPITAL_COMMUNITY): Payer: Self-pay | Admitting: Pulmonary Disease

## 2016-07-17 DIAGNOSIS — Z8572 Personal history of non-Hodgkin lymphomas: Secondary | ICD-10-CM | POA: Diagnosis not present

## 2016-07-17 DIAGNOSIS — R109 Unspecified abdominal pain: Secondary | ICD-10-CM | POA: Diagnosis not present

## 2016-07-17 DIAGNOSIS — J449 Chronic obstructive pulmonary disease, unspecified: Secondary | ICD-10-CM | POA: Diagnosis not present

## 2016-07-17 DIAGNOSIS — R5383 Other fatigue: Secondary | ICD-10-CM | POA: Diagnosis not present

## 2016-07-21 ENCOUNTER — Ambulatory Visit (HOSPITAL_COMMUNITY)
Admission: RE | Admit: 2016-07-21 | Discharge: 2016-07-21 | Disposition: A | Discharge status: Home / Self Care | Payer: Medicare Other | Source: Ambulatory Visit | Attending: Pulmonary Disease | Admitting: Pulmonary Disease

## 2016-07-21 ENCOUNTER — Encounter (HOSPITAL_COMMUNITY): Payer: Self-pay

## 2016-07-21 DIAGNOSIS — R109 Unspecified abdominal pain: Secondary | ICD-10-CM

## 2016-07-21 DIAGNOSIS — I700 Atherosclerosis of aorta: Secondary | ICD-10-CM | POA: Insufficient documentation

## 2016-07-21 DIAGNOSIS — K4090 Unilateral inguinal hernia, without obstruction or gangrene, not specified as recurrent: Secondary | ICD-10-CM | POA: Diagnosis not present

## 2016-07-21 DIAGNOSIS — K5730 Diverticulosis of large intestine without perforation or abscess without bleeding: Secondary | ICD-10-CM | POA: Diagnosis not present

## 2016-07-21 LAB — POCT I-STAT CREATININE: CREATININE: 1 mg/dL (ref 0.61–1.24)

## 2016-07-21 MED ORDER — IOPAMIDOL (ISOVUE-300) INJECTION 61%
100.0000 mL | Freq: Once | INTRAVENOUS | Status: AC | PRN
  Administered 2016-07-21: 100 mL via INTRAVENOUS

## 2016-08-02 ENCOUNTER — Other Ambulatory Visit: Payer: Self-pay | Admitting: Pharmacist

## 2016-08-02 NOTE — Patient Outreach (Signed)
Outreach call to [employer] regarding his request for follow up from the [facility] Medication Adherence Campaign.  Patient unwilling to verify identity. Patient hangs up.  Harlow Asa, PharmD Clinical Pharmacist [place] [facility] [phone]

## 2017-01-09 DIAGNOSIS — H2511 Age-related nuclear cataract, right eye: Secondary | ICD-10-CM | POA: Diagnosis not present

## 2017-01-09 DIAGNOSIS — Z961 Presence of intraocular lens: Secondary | ICD-10-CM | POA: Diagnosis not present

## 2017-01-09 DIAGNOSIS — H40013 Open angle with borderline findings, low risk, bilateral: Secondary | ICD-10-CM | POA: Diagnosis not present

## 2017-01-09 DIAGNOSIS — E119 Type 2 diabetes mellitus without complications: Secondary | ICD-10-CM | POA: Diagnosis not present

## 2017-01-18 DIAGNOSIS — I482 Chronic atrial fibrillation: Secondary | ICD-10-CM | POA: Diagnosis not present

## 2017-01-18 DIAGNOSIS — J449 Chronic obstructive pulmonary disease, unspecified: Secondary | ICD-10-CM | POA: Diagnosis not present

## 2017-01-18 DIAGNOSIS — K4090 Unilateral inguinal hernia, without obstruction or gangrene, not specified as recurrent: Secondary | ICD-10-CM | POA: Diagnosis not present

## 2017-01-23 ENCOUNTER — Ambulatory Visit: Payer: Self-pay | Admitting: General Surgery

## 2017-01-23 DIAGNOSIS — M6208 Separation of muscle (nontraumatic), other site: Secondary | ICD-10-CM | POA: Diagnosis not present

## 2017-01-23 DIAGNOSIS — K648 Other hemorrhoids: Secondary | ICD-10-CM | POA: Diagnosis not present

## 2017-01-23 DIAGNOSIS — K4090 Unilateral inguinal hernia, without obstruction or gangrene, not specified as recurrent: Secondary | ICD-10-CM | POA: Diagnosis not present

## 2017-02-05 DIAGNOSIS — H2511 Age-related nuclear cataract, right eye: Secondary | ICD-10-CM | POA: Diagnosis not present

## 2017-02-12 DIAGNOSIS — H2511 Age-related nuclear cataract, right eye: Secondary | ICD-10-CM | POA: Diagnosis not present

## 2017-03-01 ENCOUNTER — Encounter (HOSPITAL_COMMUNITY): Payer: Self-pay | Admitting: *Deleted

## 2017-03-06 NOTE — Patient Instructions (Signed)
SEENA FACE  03/06/2017   Your procedure is scheduled on: 03-09-17  Report to [facility]  Entrance take [facility]  elevators to 3rd floor to  [place] at 530AM.  Call this number if you have problems the morning of surgery [phone]   Remember: ONLY 1 PERSON MAY GO WITH YOU TO SHORT STAY TO GET  READY MORNING OF [place].  Do not eat food or drink liquids :After Midnight.     Take these medicines the morning of surgery with A SIP OF WATER: dilitiazem(cardizem), pantoprazole(protonix), inhaler/nebulizer as needed, eye drops   DO NOT TAKE ANY DIABETIC MEDICATIONS DAY OF YOUR SURGERY                               You may not have any metal on your body including hair pins and              piercings  Do not wear jewelry, make-up, lotions, powders or perfumes, deodorant             Do not wear nail polish.  Do not shave  48 hours prior to surgery.              Men may shave face and neck.   Do not bring valuables to the hospital. [place].  Contacts, dentures or bridgework may not be worn into surgery.  Leave suitcase in the car. After surgery it may be brought to your room.                Please read over the following fact sheets you were given: _____________________________________________________________________             How to Manage Your Diabetes Before and After Surgery  Why is it important to control my blood sugar before and after surgery? . Improving blood sugar levels before and after surgery helps healing and can limit problems. . A way of improving blood sugar control is eating a healthy diet by: o  Eating less sugar and carbohydrates o  Increasing activity/exercise o  Talking with your doctor about reaching your blood sugar goals . High blood sugars (greater than 180 mg/dL) can raise your risk of infections and slow your recovery, so you will need to focus on controlling  your diabetes during the weeks before surgery. . Make sure that the doctor who takes care of your diabetes knows about your planned surgery including the date and location.  How do I manage my blood sugar before surgery? . Check your blood sugar at least 4 times a day, starting 2 days before surgery, to make sure that the level is not too high or low. o Check your blood sugar the morning of your surgery when you wake up and every 2 hours until you get to the Short Stay unit. . If your blood sugar is less than 70 mg/dL, you will need to treat for low blood sugar: o Do not take insulin. o Treat a low blood sugar (less than 70 mg/dL) with  cup of clear juice (cranberry or apple), 4 glucose tablets, OR glucose gel. o Recheck blood sugar in 15 minutes after treatment (to make sure it is greater than 70 mg/dL). If  your blood sugar is not greater than 70 mg/dL on recheck, call [phone] for further instructions. . Report your blood sugar to the short stay nurse when you get to Short Stay.  . If you are admitted to the hospital after surgery: o Your blood sugar will be checked by the staff and you will probably be given insulin after surgery (instead of oral diabetes medicines) to make sure you have good blood sugar levels. o The goal for blood sugar control after surgery is 80-180 mg/dL.   WHAT DO I DO ABOUT MY DIABETES MEDICATION?  Marland Kitchen Do not take oral diabetes medicines (pills) the morning of surgery.  . THE DAY BEFORE SURGERY 03-08-17, take your METFORMIN as usual       . THE MORNING OF SURGERY 03-09-17, DO NOT TAKE ANY METFORMIN   Reviewed and Endorsed by [place] Patient Education Committee, August 2015   [facility] - Preparing for Surgery Before surgery, you can play an important role.  Because skin is not sterile, your skin needs to be as free of germs as possible.  You can reduce the number of germs on your skin by washing with CHG (chlorahexidine gluconate) soap before surgery.   CHG is an antiseptic cleaner which kills germs and bonds with the skin to continue killing germs even after washing. Please DO NOT use if you have an allergy to CHG or antibacterial soaps.  If your skin becomes reddened/irritated stop using the CHG and inform your nurse when you arrive at Short Stay. Do not shave (including legs and underarms) for at least 48 hours prior to the first CHG shower.  You may shave your face/neck. Please follow these instructions carefully:  1.  Shower with CHG Soap the night before surgery and the  morning of Surgery.  2.  If you choose to wash your hair, wash your hair first as usual with your  normal  shampoo.  3.  After you shampoo, rinse your hair and body thoroughly to remove the  shampoo.                           4.  Use CHG as you would any other liquid soap.  You can apply chg directly  to the skin and wash                       Gently with a scrungie or clean washcloth.  5.  Apply the CHG Soap to your body ONLY FROM THE NECK DOWN.   Do not use on face/ open                           Wound or open sores. Avoid contact with eyes, ears mouth and genitals (private parts).                       Wash face,  Genitals (private parts) with your normal soap.             6.  Wash thoroughly, paying special attention to the area where your surgery  will be performed.  7.  Thoroughly rinse your body with warm water from the neck down.  8.  DO NOT shower/wash with your normal soap after using and rinsing off  the CHG Soap.                9.  Fraser Din  yourself dry with a clean towel.            10.  Wear clean pajamas.            11.  Place clean sheets on your bed the night of your first shower and do not  sleep with pets. Day of Surgery : Do not apply any lotions/deodorants the morning of surgery.  Please wear clean clothes to the hospital/surgery center.  FAILURE TO FOLLOW THESE INSTRUCTIONS MAY RESULT IN THE CANCELLATION OF YOUR SURGERY PATIENT  SIGNATURE_________________________________  NURSE SIGNATURE__________________________________  ________________________________________________________________________

## 2017-03-06 NOTE — Progress Notes (Signed)
LOV/CLEARANCE DR Luan Pulling 01-18-17 ON CHART

## 2017-03-08 ENCOUNTER — Encounter (HOSPITAL_COMMUNITY): Payer: Self-pay

## 2017-03-08 ENCOUNTER — Encounter (HOSPITAL_COMMUNITY)
Admission: RE | Admit: 2017-03-08 | Discharge: 2017-03-08 | Disposition: A | Discharge status: Home / Self Care | Payer: Medicare Other | Source: Ambulatory Visit | Attending: General Surgery | Admitting: General Surgery

## 2017-03-08 DIAGNOSIS — G4733 Obstructive sleep apnea (adult) (pediatric): Secondary | ICD-10-CM | POA: Diagnosis not present

## 2017-03-08 DIAGNOSIS — Z79899 Other long term (current) drug therapy: Secondary | ICD-10-CM | POA: Diagnosis not present

## 2017-03-08 DIAGNOSIS — Z8572 Personal history of non-Hodgkin lymphomas: Secondary | ICD-10-CM | POA: Diagnosis not present

## 2017-03-08 DIAGNOSIS — K219 Gastro-esophageal reflux disease without esophagitis: Secondary | ICD-10-CM | POA: Diagnosis not present

## 2017-03-08 DIAGNOSIS — I4891 Unspecified atrial fibrillation: Secondary | ICD-10-CM | POA: Diagnosis not present

## 2017-03-08 DIAGNOSIS — E119 Type 2 diabetes mellitus without complications: Secondary | ICD-10-CM | POA: Diagnosis not present

## 2017-03-08 DIAGNOSIS — Z7984 Long term (current) use of oral hypoglycemic drugs: Secondary | ICD-10-CM | POA: Diagnosis not present

## 2017-03-08 DIAGNOSIS — I951 Orthostatic hypotension: Secondary | ICD-10-CM | POA: Diagnosis not present

## 2017-03-08 DIAGNOSIS — Z7982 Long term (current) use of aspirin: Secondary | ICD-10-CM | POA: Diagnosis not present

## 2017-03-08 DIAGNOSIS — Z96642 Presence of left artificial hip joint: Secondary | ICD-10-CM | POA: Diagnosis not present

## 2017-03-08 DIAGNOSIS — K4090 Unilateral inguinal hernia, without obstruction or gangrene, not specified as recurrent: Secondary | ICD-10-CM | POA: Diagnosis not present

## 2017-03-08 DIAGNOSIS — J449 Chronic obstructive pulmonary disease, unspecified: Secondary | ICD-10-CM | POA: Diagnosis not present

## 2017-03-08 LAB — CBC
HCT: 43.6 % (ref 39.0–52.0)
Hemoglobin: 14.9 g/dL (ref 13.0–17.0)
MCH: 30.7 pg (ref 26.0–34.0)
MCHC: 34.2 g/dL (ref 30.0–36.0)
MCV: 89.7 fL (ref 78.0–100.0)
Platelets: 210 10*3/uL (ref 150–400)
RBC: 4.86 MIL/uL (ref 4.22–5.81)
RDW: 13.5 % (ref 11.5–15.5)
WBC: 11.1 10*3/uL — AB (ref 4.0–10.5)

## 2017-03-08 LAB — BASIC METABOLIC PANEL
Anion gap: 8 (ref 5–15)
BUN: 29 mg/dL — AB (ref 6–20)
CHLORIDE: 107 mmol/L (ref 101–111)
CO2: 24 mmol/L (ref 22–32)
Calcium: 9.6 mg/dL (ref 8.9–10.3)
Creatinine, Ser: 1.56 mg/dL — ABNORMAL HIGH (ref 0.61–1.24)
GFR calc Af Amer: 51 mL/min — ABNORMAL LOW (ref >60)
GFR calc non Af Amer: 44 mL/min — ABNORMAL LOW (ref >60)
Glucose, Bld: 101 mg/dL — ABNORMAL HIGH (ref 65–99)
Potassium: 4.4 mmol/L (ref 3.5–5.1)
SODIUM: 139 mmol/L (ref 135–145)

## 2017-03-08 LAB — GLUCOSE, CAPILLARY: Glucose-Capillary: 128 mg/dL — ABNORMAL HIGH (ref 65–99)

## 2017-03-08 NOTE — Progress Notes (Signed)
BMP results routed to Dr Richardo Hanks via epic

## 2017-03-09 ENCOUNTER — Encounter (HOSPITAL_COMMUNITY)
Admission: RE | Disposition: A | Discharge status: Home / Self Care | Payer: Self-pay | Source: Ambulatory Visit | Attending: General Surgery

## 2017-03-09 ENCOUNTER — Encounter (HOSPITAL_COMMUNITY): Payer: Self-pay | Admitting: *Deleted

## 2017-03-09 ENCOUNTER — Ambulatory Visit (HOSPITAL_COMMUNITY): Payer: Medicare Other | Admitting: Anesthesiology

## 2017-03-09 ENCOUNTER — Ambulatory Visit (HOSPITAL_COMMUNITY)
Admission: RE | Admit: 2017-03-09 | Discharge: 2017-03-09 | Disposition: A | Discharge status: Home / Self Care | Payer: Medicare Other | Source: Ambulatory Visit | Attending: General Surgery | Admitting: General Surgery

## 2017-03-09 DIAGNOSIS — Z96642 Presence of left artificial hip joint: Secondary | ICD-10-CM | POA: Diagnosis not present

## 2017-03-09 DIAGNOSIS — G8918 Other acute postprocedural pain: Secondary | ICD-10-CM | POA: Diagnosis not present

## 2017-03-09 DIAGNOSIS — G4733 Obstructive sleep apnea (adult) (pediatric): Secondary | ICD-10-CM | POA: Insufficient documentation

## 2017-03-09 DIAGNOSIS — Z6830 Body mass index (BMI) 30.0-30.9, adult: Secondary | ICD-10-CM | POA: Insufficient documentation

## 2017-03-09 DIAGNOSIS — Z7982 Long term (current) use of aspirin: Secondary | ICD-10-CM | POA: Diagnosis not present

## 2017-03-09 DIAGNOSIS — J449 Chronic obstructive pulmonary disease, unspecified: Secondary | ICD-10-CM | POA: Insufficient documentation

## 2017-03-09 DIAGNOSIS — I951 Orthostatic hypotension: Secondary | ICD-10-CM | POA: Diagnosis not present

## 2017-03-09 DIAGNOSIS — K4090 Unilateral inguinal hernia, without obstruction or gangrene, not specified as recurrent: Secondary | ICD-10-CM | POA: Insufficient documentation

## 2017-03-09 DIAGNOSIS — Z79899 Other long term (current) drug therapy: Secondary | ICD-10-CM | POA: Insufficient documentation

## 2017-03-09 DIAGNOSIS — Z8572 Personal history of non-Hodgkin lymphomas: Secondary | ICD-10-CM | POA: Diagnosis not present

## 2017-03-09 DIAGNOSIS — K219 Gastro-esophageal reflux disease without esophagitis: Secondary | ICD-10-CM | POA: Insufficient documentation

## 2017-03-09 DIAGNOSIS — E669 Obesity, unspecified: Secondary | ICD-10-CM | POA: Insufficient documentation

## 2017-03-09 DIAGNOSIS — F17210 Nicotine dependence, cigarettes, uncomplicated: Secondary | ICD-10-CM | POA: Insufficient documentation

## 2017-03-09 DIAGNOSIS — E119 Type 2 diabetes mellitus without complications: Secondary | ICD-10-CM | POA: Diagnosis not present

## 2017-03-09 DIAGNOSIS — I4891 Unspecified atrial fibrillation: Secondary | ICD-10-CM | POA: Insufficient documentation

## 2017-03-09 DIAGNOSIS — Z7984 Long term (current) use of oral hypoglycemic drugs: Secondary | ICD-10-CM | POA: Insufficient documentation

## 2017-03-09 HISTORY — PX: INGUINAL HERNIA REPAIR: SHX194

## 2017-03-09 HISTORY — PX: INSERTION OF MESH: SHX5868

## 2017-03-09 LAB — GLUCOSE, CAPILLARY
Glucose-Capillary: 110 mg/dL — ABNORMAL HIGH (ref 65–99)
Glucose-Capillary: 125 mg/dL — ABNORMAL HIGH (ref 65–99)

## 2017-03-09 LAB — HEMOGLOBIN A1C
Hgb A1c MFr Bld: 5.7 % — ABNORMAL HIGH (ref 4.8–5.6)
MEAN PLASMA GLUCOSE: 117 mg/dL

## 2017-03-09 SURGERY — REPAIR, HERNIA, INGUINAL, ADULT
Anesthesia: General | Site: Groin | Laterality: Left

## 2017-03-09 MED ORDER — PROPOFOL 10 MG/ML IV BOLUS
INTRAVENOUS | Status: DC | PRN
  Administered 2017-03-09: 50 mg via INTRAVENOUS
  Administered 2017-03-09: 150 mg via INTRAVENOUS

## 2017-03-09 MED ORDER — BUPIVACAINE LIPOSOME 1.3 % IJ SUSP
20.0000 mL | Freq: Once | INTRAMUSCULAR | Status: DC
  Filled 2017-03-09: qty 20

## 2017-03-09 MED ORDER — MIDAZOLAM HCL 2 MG/2ML IJ SOLN
INTRAMUSCULAR | Status: AC
  Filled 2017-03-09: qty 2

## 2017-03-09 MED ORDER — ONDANSETRON HCL 4 MG/2ML IJ SOLN: 4.0000 mg | Freq: Once | INTRAMUSCULAR | Status: DC | PRN

## 2017-03-09 MED ORDER — CELECOXIB 200 MG PO CAPS
400.0000 mg | ORAL_CAPSULE | ORAL | Status: AC
  Administered 2017-03-09: 400 mg via ORAL
  Filled 2017-03-09: qty 2

## 2017-03-09 MED ORDER — SUCCINYLCHOLINE CHLORIDE 200 MG/10ML IV SOSY
PREFILLED_SYRINGE | INTRAVENOUS | Status: AC
  Filled 2017-03-09: qty 10

## 2017-03-09 MED ORDER — SUGAMMADEX SODIUM 200 MG/2ML IV SOLN
INTRAVENOUS | Status: AC
  Filled 2017-03-09: qty 2

## 2017-03-09 MED ORDER — ONDANSETRON HCL 4 MG/2ML IJ SOLN
INTRAMUSCULAR | Status: AC
  Filled 2017-03-09: qty 2

## 2017-03-09 MED ORDER — LACTATED RINGERS IV SOLN
INTRAVENOUS | Status: DC | PRN
  Administered 2017-03-09: 07:00:00 via INTRAVENOUS

## 2017-03-09 MED ORDER — HYDROCODONE-ACETAMINOPHEN 5-325 MG PO TABS: 1.0000 | ORAL_TABLET | Freq: Four times a day (QID) | ORAL | 0 refills | Status: DC | PRN

## 2017-03-09 MED ORDER — 0.9 % SODIUM CHLORIDE (POUR BTL) OPTIME
TOPICAL | Status: DC | PRN
  Administered 2017-03-09: 1000 mL

## 2017-03-09 MED ORDER — PROPOFOL 10 MG/ML IV BOLUS
INTRAVENOUS | Status: AC
  Filled 2017-03-09: qty 20

## 2017-03-09 MED ORDER — BUPIVACAINE-EPINEPHRINE 0.25% -1:200000 IJ SOLN
INTRAMUSCULAR | Status: AC
  Filled 2017-03-09: qty 1

## 2017-03-09 MED ORDER — FENTANYL CITRATE (PF) 100 MCG/2ML IJ SOLN
INTRAMUSCULAR | Status: AC
  Filled 2017-03-09: qty 2

## 2017-03-09 MED ORDER — CEFAZOLIN SODIUM-DEXTROSE 2-4 GM/100ML-% IV SOLN
2.0000 g | INTRAVENOUS | Status: AC
  Administered 2017-03-09: 2 g via INTRAVENOUS

## 2017-03-09 MED ORDER — HYDROMORPHONE HCL 1 MG/ML IJ SOLN
0.2500 mg | INTRAMUSCULAR | Status: DC | PRN
  Administered 2017-03-09: 0.5 mg via INTRAVENOUS

## 2017-03-09 MED ORDER — CEFAZOLIN SODIUM-DEXTROSE 2-4 GM/100ML-% IV SOLN
INTRAVENOUS | Status: AC
  Filled 2017-03-09: qty 100

## 2017-03-09 MED ORDER — ACETAMINOPHEN 500 MG PO TABS
1000.0000 mg | ORAL_TABLET | ORAL | Status: AC
Start: 2017-03-09 — End: 2017-03-09
  Administered 2017-03-09: 1000 mg via ORAL
  Filled 2017-03-09: qty 2

## 2017-03-09 MED ORDER — ONDANSETRON HCL 4 MG/2ML IJ SOLN
INTRAMUSCULAR | Status: DC | PRN
  Administered 2017-03-09: 4 mg via INTRAVENOUS

## 2017-03-09 MED ORDER — GABAPENTIN 300 MG PO CAPS
300.0000 mg | ORAL_CAPSULE | ORAL | Status: AC
  Administered 2017-03-09: 300 mg via ORAL
  Filled 2017-03-09: qty 1

## 2017-03-09 MED ORDER — MEPERIDINE HCL 50 MG/ML IJ SOLN: 6.2500 mg | INTRAMUSCULAR | Status: DC | PRN

## 2017-03-09 MED ORDER — HYDROMORPHONE HCL 1 MG/ML IJ SOLN
INTRAMUSCULAR | Status: AC
  Filled 2017-03-09: qty 1

## 2017-03-09 MED ORDER — LIDOCAINE 2% (20 MG/ML) 5 ML SYRINGE
INTRAMUSCULAR | Status: DC | PRN
  Administered 2017-03-09: 100 mg via INTRAVENOUS

## 2017-03-09 MED ORDER — ROCURONIUM BROMIDE 50 MG/5ML IV SOSY
PREFILLED_SYRINGE | INTRAVENOUS | Status: AC
  Filled 2017-03-09: qty 5

## 2017-03-09 MED ORDER — BUPIVACAINE HCL (PF) 0.5 % IJ SOLN
INTRAMUSCULAR | Status: AC
  Filled 2017-03-09: qty 30

## 2017-03-09 MED ORDER — MIDAZOLAM HCL 2 MG/2ML IJ SOLN
INTRAMUSCULAR | Status: DC | PRN
  Administered 2017-03-09 (×2): 1 mg via INTRAVENOUS

## 2017-03-09 MED ORDER — FENTANYL CITRATE (PF) 100 MCG/2ML IJ SOLN
INTRAMUSCULAR | Status: DC | PRN
  Administered 2017-03-09: 25 ug via INTRAVENOUS
  Administered 2017-03-09 (×2): 50 ug via INTRAVENOUS

## 2017-03-09 MED ORDER — LIDOCAINE 2% (20 MG/ML) 5 ML SYRINGE
INTRAMUSCULAR | Status: AC
  Filled 2017-03-09: qty 5

## 2017-03-09 MED ORDER — HYDROCODONE-ACETAMINOPHEN 5-325 MG PO TABS
1.0000 | ORAL_TABLET | ORAL | Status: DC | PRN
  Administered 2017-03-09: 1 via ORAL
  Filled 2017-03-09: qty 1

## 2017-03-09 SURGICAL SUPPLY — 43 items
APL SKNCLS STERI-STRIP NONHPOA (GAUZE/BANDAGES/DRESSINGS) ×1
BENZOIN TINCTURE PRP APPL 2/3 (GAUZE/BANDAGES/DRESSINGS) ×2 IMPLANT
BLADE SURG 15 STRL LF DISP TIS (BLADE) ×1 IMPLANT
BLADE SURG 15 STRL SS (BLADE) ×2
CELLS DAT CNTRL 66122 CELL SVR (MISCELLANEOUS) ×1 IMPLANT
CHLORAPREP W/TINT 26ML (MISCELLANEOUS) ×2 IMPLANT
COVER SURGICAL LIGHT HANDLE (MISCELLANEOUS) ×2 IMPLANT
DECANTER SPIKE VIAL GLASS SM (MISCELLANEOUS) ×2 IMPLANT
DRAIN PENROSE 18X1/2 LTX STRL (DRAIN) ×1 IMPLANT
DRAPE LAPAROTOMY TRNSV 102X78 (DRAPE) ×2 IMPLANT
DRSG TEGADERM 4X4.75 (GAUZE/BANDAGES/DRESSINGS) ×1 IMPLANT
ELECT PENCIL ROCKER SW 15FT (MISCELLANEOUS) ×2 IMPLANT
ELECT REM PT RETURN 9FT ADLT (ELECTROSURGICAL) ×2
ELECTRODE REM PT RTRN 9FT ADLT (ELECTROSURGICAL) ×1 IMPLANT
GAUZE SPONGE 4X4 12PLY STRL (GAUZE/BANDAGES/DRESSINGS) ×2 IMPLANT
GLOVE BIO SURGEON STRL SZ7 (GLOVE) ×2 IMPLANT
GLOVE SURG SS PI 7.0 STRL IVOR (GLOVE) ×2 IMPLANT
GOWN STRL REUS W/TWL LRG LVL3 (GOWN DISPOSABLE) ×2 IMPLANT
GOWN STRL REUS W/TWL XL LVL3 (GOWN DISPOSABLE) ×2 IMPLANT
KIT BASIN OR (CUSTOM PROCEDURE TRAY) ×2 IMPLANT
MESH BARD SOFT 3X6IN (Mesh General) ×1 IMPLANT
NEEDLE HYPO 22GX1.5 SAFETY (NEEDLE) ×2 IMPLANT
PACK BASIC VI WITH GOWN DISP (CUSTOM PROCEDURE TRAY) ×2 IMPLANT
RETRACTOR WND ALEXIS 18 MED (MISCELLANEOUS) IMPLANT
RETRACTOR WND ALEXIS 25 LRG (MISCELLANEOUS) IMPLANT
RTRCTR WOUND ALEXIS 18CM MED (MISCELLANEOUS) ×2
RTRCTR WOUND ALEXIS 25CM LRG (MISCELLANEOUS)
SPONGE LAP 18X18 X RAY DECT (DISPOSABLE) IMPLANT
SPONGE LAP 4X18 X RAY DECT (DISPOSABLE) ×2 IMPLANT
STRIP CLOSURE SKIN 1/2X4 (GAUZE/BANDAGES/DRESSINGS) ×1 IMPLANT
SUT MNCRL AB 4-0 PS2 18 (SUTURE) ×2 IMPLANT
SUT PROLENE 2 0 CT2 30 (SUTURE) ×4 IMPLANT
SUT SILK 2 0 SH CR/8 (SUTURE) IMPLANT
SUT VIC AB 2-0 CT1 27 (SUTURE) ×4
SUT VIC AB 2-0 CT1 TAPERPNT 27 (SUTURE) ×1 IMPLANT
SUT VIC AB 3-0 SH 27 (SUTURE) ×2
SUT VIC AB 3-0 SH 27XBRD (SUTURE) ×1 IMPLANT
SUT VICRYL 0 TIES 12 18 (SUTURE) ×2 IMPLANT
SYR BULB IRRIGATION 50ML (SYRINGE) ×2 IMPLANT
SYR CONTROL 10ML LL (SYRINGE) ×2 IMPLANT
TOWEL OR 17X26 10 PK STRL BLUE (TOWEL DISPOSABLE) ×2 IMPLANT
TOWEL OR NON WOVEN STRL DISP B (DISPOSABLE) ×2 IMPLANT
YANKAUER SUCT BULB TIP 10FT TU (MISCELLANEOUS) ×2 IMPLANT

## 2017-03-09 NOTE — Transfer of Care (Signed)
Immediate Anesthesia Transfer of Care Note  Patient: [name]  Procedure(s) Performed: Procedure(s): OPEN REPAIR LEFT INGUINAL HERNIA (Left) INSERTION OF MESH (Left)  Patient Location: PACU  Anesthesia Type:General and Regional  Level of Consciousness: patient cooperative  Airway & Oxygen Therapy: Patient Spontanous Breathing and Patient connected to face mask oxygen  Post-op Assessment: Report given to RN and Post -op Vital signs reviewed and stable  Post vital signs: Reviewed and stable  Last Vitals:  Vitals:   03/09/17 0514  BP: 140/80  Pulse: (!) 103  Resp: 18  Temp: 36.7 C    Last Pain:  Vitals:   03/09/17 0524  TempSrc:   PainSc: 5       Patients Stated Pain Goal: 3 (56/70/14 1030)  Complications: No apparent anesthesia complications

## 2017-03-09 NOTE — Op Note (Signed)
Preop diagnosis: left inguinal hernia  Postop diagnosis: left inguinal hernia  Procedure: open Left inguinal hernia repair with mesh  Surgeon: Gurney Maxin, M.D.  Asst: none  Anesthesia: Gen.   Indications for procedure: [name] is a 71 y.o. male with symptoms of pain and enlarging Left inguinal hernia(s). After discussing risks, alternatives and benefits he decided on open repair and was brought to day surgery for repair.  Description of procedure: The patient was brought into the operative suite, placed supine. Anesthesia was administered with endotracheal tube. Patient was strapped in place. The patient was prepped and draped in the usual sterile fashion.  The anterior superior iliac spine and pubic tubercle were identified on the Left side. An incision was made 1cm above the connecting line, representative of the location of the inguinal ligament. The subcutaneous tissue was bluntly dissected, scarpa's fascia was dissected away. A medium sized wound protector was put in place. The external abdominal oblique fascia was identified and sharply opened down to the external inguinal ring. The conjoint tendon and inguinal ligament were identified. The cord structures and sac were dissected free of the surrounding tissue in 360 degrees. A penrose drain was used to encircle the contents. The cremasteric fibers were dissected free of the contents of the cord and hernia sac. The cord structures (vessels and vas deferens) were identified and carefully dissected away from the hernia sac. The hernia sac was dissected down to the internal inguinal ring. Preperitoneal fat was identified showing appropriate dissection. There was a thin walled indirect sac and a moderate sized lipoma of the cord. The lipoma was ligated with 2-0 vicryl and removed. The hernia sac was ligated with 2-0 vicryl at the base and excised.  The sac was then reduced into the preperitoneal space. A 3x6 Bard Soft mesh was then used to  close the defect and reinforce the floor. The mesh was sutured to the lacunar ligament and inguinal ligament using a 2-0 prolene in running fashion. Next the superior edge of the mesh was sutured to the conjoined tendon using a 2-0 running Prolene. An additional 2-0 Prolene was used to suture the tail ends of the mesh together re-creating the deep ring. Cord structures are running in a neutral position through the mesh. Next the external abdominal oblique fascia was closed with a 2-0 Vicryl in running fashion to re-create the external inguinal ring. Scarpa's fascia was closed with 3-0 Vicryl in running fashion. Skin was closed with a 4-0 Monocryl subcuticular stitch in running fashion. Steristrips were put in place for dressing. Patient woke from anesthesia and brought to PACU in stable condition. All counts are correct.    Findings: left indirect inguinal hernia  Specimen: none  Blood loss: 30 ml  Local anesthesia: none  Complications: none  Implant: 3x6 bard soft mesh  Gurney Maxin, M.D. General, Bariatric, & Minimally Invasive Surgery [facility], [place] 8:48 AM 03/09/2017

## 2017-03-09 NOTE — Anesthesia Preprocedure Evaluation (Signed)
Anesthesia Evaluation  Patient identified by MRN, date of birth, ID band Patient awake    Reviewed: Allergy & Precautions, NPO status , Patient's Chart, lab work & pertinent test results  History of Anesthesia Complications (+) PONV  Airway Mallampati: I  TM Distance: >3 FB Neck ROM: Full    Dental   Pulmonary sleep apnea , Current Smoker,    Pulmonary exam normal        Cardiovascular Normal cardiovascular exam     Neuro/Psych    GI/Hepatic GERD  Medicated and Controlled,  Endo/Other  diabetes  Renal/GU      Musculoskeletal   Abdominal   Peds  Hematology   Anesthesia Other Findings   Reproductive/Obstetrics                             Anesthesia Physical Anesthesia Plan  ASA: III  Anesthesia Plan: General   Post-op Pain Management:  Regional for Post-op pain   Induction: Intravenous  Airway Management Planned: LMA  Additional Equipment:   Intra-op Plan:   Post-operative Plan: Extubation in OR  Informed Consent: I have reviewed the patients History and Physical, chart, labs and discussed the procedure including the risks, benefits and alternatives for the proposed anesthesia with the patient or authorized representative who has indicated his/her understanding and acceptance.     Plan Discussed with: CRNA and Surgeon  Anesthesia Plan Comments:         Anesthesia Quick Evaluation

## 2017-03-09 NOTE — Anesthesia Procedure Notes (Signed)
Anesthesia Regional Block: TAP block   Pre-Anesthetic Checklist: ,, timeout performed, Correct Patient, Correct Site, Correct Laterality, Correct Procedure, Correct Position, site marked, Risks and benefits discussed,  Surgical consent,  Pre-op evaluation,  At surgeon's request and post-op pain management  Laterality: Left  Prep: chloraprep, alcohol swabs       Needles:  Injection technique: Single-shot  Needle Type: Echogenic Stimulator Needle     Needle Length: 9cm  Needle Gauge: 21     Additional Needles:   Procedures: ultrasound guided,,,,,,,,  Narrative:  Start time: 03/09/2017 7:20 AM End time: 03/09/2017 7:28 AM Injection made incrementally with aspirations every 5 mL.  Performed by: Personally  Anesthesiologist: Lillia Abed  Additional Notes: Monitors applied. Patient sedated. Sterile prep and drape,hand hygiene and sterile gloves were used. Relevant anatomy identified.Needle position confirmed.Local anesthetic injected incrementally after negative aspiration. Local anesthetic spread visualized in Transversus Abdominus Plane. Vascular puncture avoided. No complications. Image printed for medical record.The patient tolerated the procedure well.

## 2017-03-09 NOTE — Discharge Instructions (Signed)

## 2017-03-09 NOTE — Anesthesia Procedure Notes (Addendum)
Procedure Name: LMA Insertion Date/Time: 03/09/2017 7:42 AM Performed by: Dione Booze Pre-anesthesia Checklist: Patient identified, Emergency Drugs available, Suction available and Patient being monitored Patient Re-evaluated:Patient Re-evaluated prior to inductionOxygen Delivery Method: Circle system utilized Preoxygenation: Pre-oxygenation with 100% oxygen LMA: LMA with gastric port inserted LMA Size: 4.0 Number of attempts: 1 Placement Confirmation: positive ETCO2 Tube secured with: Tape Dental Injury: Teeth and Oropharynx as per pre-operative assessment

## 2017-03-09 NOTE — Anesthesia Postprocedure Evaluation (Signed)
Anesthesia Post Note  Patient: [name]  Procedure(s) Performed: Procedure(s) (LRB): OPEN REPAIR LEFT INGUINAL HERNIA (Left) INSERTION OF MESH (Left)  Patient location during evaluation: PACU Anesthesia Type: General Level of consciousness: awake and alert Pain management: pain level controlled Vital Signs Assessment: post-procedure vital signs reviewed and stable Respiratory status: spontaneous breathing, nonlabored ventilation, respiratory function stable and patient connected to nasal cannula oxygen Cardiovascular status: blood pressure returned to baseline and stable Postop Assessment: no signs of nausea or vomiting Anesthetic complications: no       Last Vitals:  Vitals:   03/09/17 0945 03/09/17 0952  BP: 113/80 104/77  Pulse: 100 68  Resp: (!) 21   Temp:      Last Pain:  Vitals:   03/09/17 0900  TempSrc:   PainSc: [place]

## 2017-03-09 NOTE — H&P (Signed)
[name] is an 71 y.o. male.   Chief Complaint: left inguinal hernia HPI: 71 yo male with left inguinal hernia that gives him intermittent discomfort. He denies nausea or vomiting.  Past Medical History:  Diagnosis Date  . Atrial fibrillation ([place])   . Avascular necrosis of bones of both hips ([place])   . 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 Cancer ([place])   . Colonic polyp   . Complication of anesthesia    hard to wake up, "felt like died and revived me"  . COPD (chronic obstructive pulmonary disease) (HCC)    mild PFT abnormalities; normal ABG  . Degenerative disc disease, lumbar    HNP of the LS spine  . Diabetes mellitus    type 2  . GERD (gastroesophageal reflux disease)   . Glaucoma   . H/O hiatal hernia   . Hx MRSA infection 2010   sith splenic abcess  . Non Hodgkin's lymphoma ([place]) 06/2009   chemotherapy until 11/2010  . Obesity   . Obstructive sleep apnea    treated with CPAP  . Orthostatic hypotension    lightheadness; no definate loss of consciousness  . Pedal edema   . PONV (postoperative nausea and vomiting)   . Splenic abscess 07/2009  . Tobacco abuse    45 pack years    Past Surgical History:  Procedure Laterality Date  . ABSCESS DRAINAGE  2010   Splenic  . ANAL FISSURE 56  72years old  . CATARACT EXTRACTION     Right with lens implant  . COLONOSCOPY W/ POLYPECTOMY  2008   with snare polypectomy  . EYE SURGERY     lens implant  . KNEE ARTHROSCOPY     right  . LYMPH NODE BIOPSY  2010   done x 2 for NHL diagnosis  . TONSILLECTOMY    . TOTAL HIP ARTHROPLASTY  12/13/2012   Procedure: TOTAL HIP ARTHROPLASTY ANTERIOR APPROACH;  Surgeon: Mcarthur Rossetti, MD;  Location: [facility];  Service: Orthopedics;  Laterality: Left;  Left Total Hip Arthroplasty, Anterior Approach (C-Arm)    Family History  Problem Relation Age of Onset  . Heart failure Mother 61    results of chf  . Leukemia Father   . Colon cancer Sister   . Heart disease Brother    . Heart disease Brother   . Heart disease Brother   . Heart disease Brother    Social History:  reports that he has been smoking Cigarettes.  He has a 55.00 pack-year smoking history. He has never used smokeless tobacco. He reports that he does not drink alcohol or use drugs.  Allergies:  Allergies  Allergen Reactions  . Codeine Other (See Comments)    REACTION: Shakes   . Pred Forte [Prednisolone Acetate] Other (See Comments)    Severe burning    Medications Prior to Admission  Medication Sig Dispense Refill  . aspirin EC 325 MG tablet Take 1 tablet (325 mg total) by mouth daily. 30 tablet 0  . diltiazem (CARDIZEM CD) 240 MG 24 hr capsule Take 240 mg by mouth daily with breakfast.    . latanoprost (XALATAN) 0.005 % ophthalmic solution Place 1 drop into both eyes daily at 10 pm.    . metFORMIN (GLUCOPHAGE) 500 MG tablet Take 500 mg by mouth 2 (two) times daily.    . pantoprazole (PROTONIX) 40 MG tablet Take 40 mg by mouth daily.    . timolol (TIMOPTIC) 0.5 % ophthalmic solution  Place 1 drop into both eyes daily.    Marland Kitchen ipratropium-albuterol (DUONEB) 0.5-2.5 (3) MG/3ML SOLN Take 3 mLs by nebulization every 6 (six) hours as needed (for wheezing/shortness of breath).    . senna (SENOKOT) 8.6 MG tablet Take 1-2 tablets by mouth daily as needed for constipation. Constipation      Results for orders placed or performed during the hospital encounter of 03/09/17 (from the past 48 hour(s))  Glucose, capillary     Status: Abnormal   Collection Time: 03/09/17  5:08 AM  Result Value Ref Range   Glucose-Capillary 110 (H) 65 - 99 mg/dL   Comment 1 Notify RN    No results found.  Review of Systems  Constitutional: Negative for chills and fever.  HENT: Negative for hearing loss.   Eyes: Negative for blurred vision and double vision.  Respiratory: Negative for cough and hemoptysis.   Cardiovascular: Negative for chest pain and palpitations.  Gastrointestinal: Negative for abdominal pain,  nausea and vomiting.  Genitourinary: Negative for dysuria and urgency.  Musculoskeletal: Negative for myalgias and neck pain.  Skin: Negative for itching and rash.  Neurological: Negative for dizziness, tingling and headaches.  Endo/Heme/Allergies: Does not bruise/bleed easily.  Psychiatric/Behavioral: Negative for depression and suicidal ideas.    Blood pressure 140/80, pulse (!) 103, temperature 98 F (36.7 C), temperature source Oral, resp. rate 18, height 6\' 4"  (1.93 m), weight 114.8 kg (253 lb), SpO2 97 %. Physical Exam  Vitals reviewed. Constitutional: He is oriented to person, place, and time. He appears well-developed and well-nourished.  HENT:  Head: Normocephalic and atraumatic.  Eyes: Conjunctivae and EOM are normal. Pupils are equal, round, and reactive to light.  Neck: Normal range of motion. Neck supple.  Cardiovascular: Normal rate and regular rhythm.   Respiratory: Effort normal and breath sounds normal.  GI: Soft. Bowel sounds are normal. He exhibits no distension. There is no tenderness.  Left inguinal hernia  Musculoskeletal: Normal range of motion.  Neurological: He is alert and oriented to person, place, and time.  Skin: Skin is warm and dry.  Psychiatric: He has a normal mood and affect. His behavior is normal.     Assessment/Plan 71 yo male with left inguinal hernia -open left inguinal hernia  Mickeal Skinner, MD 03/09/2017, 7:23 AM

## 2017-07-23 DIAGNOSIS — H409 Unspecified glaucoma: Secondary | ICD-10-CM | POA: Diagnosis not present

## 2017-07-23 DIAGNOSIS — I482 Chronic atrial fibrillation: Secondary | ICD-10-CM | POA: Diagnosis not present

## 2017-07-23 DIAGNOSIS — J449 Chronic obstructive pulmonary disease, unspecified: Secondary | ICD-10-CM | POA: Diagnosis not present

## 2017-08-02 DIAGNOSIS — H401111 Primary open-angle glaucoma, right eye, mild stage: Secondary | ICD-10-CM | POA: Diagnosis not present

## 2017-08-02 DIAGNOSIS — H401123 Primary open-angle glaucoma, left eye, severe stage: Secondary | ICD-10-CM | POA: Diagnosis not present

## 2017-10-19 DIAGNOSIS — H401123 Primary open-angle glaucoma, left eye, severe stage: Secondary | ICD-10-CM | POA: Diagnosis not present

## 2017-10-19 DIAGNOSIS — H401111 Primary open-angle glaucoma, right eye, mild stage: Secondary | ICD-10-CM | POA: Diagnosis not present

## 2018-02-19 DIAGNOSIS — H401111 Primary open-angle glaucoma, right eye, mild stage: Secondary | ICD-10-CM | POA: Diagnosis not present

## 2018-02-19 DIAGNOSIS — H401123 Primary open-angle glaucoma, left eye, severe stage: Secondary | ICD-10-CM | POA: Diagnosis not present

## 2018-02-19 DIAGNOSIS — Z961 Presence of intraocular lens: Secondary | ICD-10-CM | POA: Diagnosis not present

## 2018-02-19 DIAGNOSIS — E119 Type 2 diabetes mellitus without complications: Secondary | ICD-10-CM | POA: Diagnosis not present

## 2018-03-19 DIAGNOSIS — H401111 Primary open-angle glaucoma, right eye, mild stage: Secondary | ICD-10-CM | POA: Diagnosis not present

## 2018-03-19 DIAGNOSIS — H401123 Primary open-angle glaucoma, left eye, severe stage: Secondary | ICD-10-CM | POA: Diagnosis not present

## 2018-03-21 DIAGNOSIS — K219 Gastro-esophageal reflux disease without esophagitis: Secondary | ICD-10-CM | POA: Diagnosis not present

## 2018-03-21 DIAGNOSIS — Z0000 Encounter for general adult medical examination without abnormal findings: Secondary | ICD-10-CM | POA: Diagnosis not present

## 2018-03-21 DIAGNOSIS — Z125 Encounter for screening for malignant neoplasm of prostate: Secondary | ICD-10-CM | POA: Diagnosis not present

## 2018-03-21 DIAGNOSIS — G4733 Obstructive sleep apnea (adult) (pediatric): Secondary | ICD-10-CM | POA: Diagnosis not present

## 2018-03-21 DIAGNOSIS — J449 Chronic obstructive pulmonary disease, unspecified: Secondary | ICD-10-CM | POA: Diagnosis not present

## 2018-03-21 DIAGNOSIS — I482 Chronic atrial fibrillation: Secondary | ICD-10-CM | POA: Diagnosis not present

## 2018-03-21 DIAGNOSIS — M5135 Other intervertebral disc degeneration, thoracolumbar region: Secondary | ICD-10-CM | POA: Diagnosis not present

## 2018-03-21 DIAGNOSIS — E119 Type 2 diabetes mellitus without complications: Secondary | ICD-10-CM | POA: Diagnosis not present

## 2018-03-22 LAB — COMPREHENSIVE METABOLIC PANEL
ALT: 18 (ref 3–30)
AST: 16
Albumin/Globulin Ratio: 1.7
Albumin: 4.3 (ref 3.5–5.0)
Alkaline Phosphatase: 85
BUN/Creatinine Ratio: 20
BUN: 27 — AB (ref 4–21)
Calcium: 9.8
Carbon Dioxide, Total: 23
Chloride: 102
Creat: 1.34
EGFR (African American): 62
EGFR (Non-African Amer.): 53
Globulin, Total: 2.5
Glucose: 91
Potassium: 5
Sodium: 141
Total Bilirubin: 0.3
Total Protein: 6.8 (ref 6.4–8.2)

## 2018-03-22 LAB — LIPID PANEL
Cholesterol: 208
HDL Cholesterol: 28 — AB (ref 35–70)
LDL Cholesterol: 135
Triglycerides: 224 — AB (ref 40–160)
VLDL Cholesterol Cal: 45

## 2018-03-22 LAB — HEMOGLOBIN A1C: Hgb A1c MFr Bld: 5.9 (ref 4.0–6.0)

## 2018-03-22 LAB — TSH: TSH: 2.71

## 2018-03-27 ENCOUNTER — Other Ambulatory Visit: Payer: Self-pay | Admitting: Acute Care

## 2018-03-27 DIAGNOSIS — F17210 Nicotine dependence, cigarettes, uncomplicated: Secondary | ICD-10-CM

## 2018-03-27 DIAGNOSIS — Z122 Encounter for screening for malignant neoplasm of respiratory organs: Secondary | ICD-10-CM

## 2018-04-03 DIAGNOSIS — Z1211 Encounter for screening for malignant neoplasm of colon: Secondary | ICD-10-CM | POA: Diagnosis not present

## 2018-04-17 ENCOUNTER — Encounter: Payer: Self-pay | Admitting: Acute Care

## 2018-04-17 ENCOUNTER — Ambulatory Visit (INDEPENDENT_AMBULATORY_CARE_PROVIDER_SITE_OTHER): Payer: Medicare Other | Admitting: Acute Care

## 2018-04-17 ENCOUNTER — Ambulatory Visit (INDEPENDENT_AMBULATORY_CARE_PROVIDER_SITE_OTHER)
Admission: RE | Admit: 2018-04-17 | Discharge: 2018-04-17 | Disposition: A | Discharge status: Home / Self Care | Payer: Medicare Other | Source: Ambulatory Visit | Attending: Acute Care | Admitting: Acute Care

## 2018-04-17 DIAGNOSIS — Z122 Encounter for screening for malignant neoplasm of respiratory organs: Secondary | ICD-10-CM

## 2018-04-17 DIAGNOSIS — F17210 Nicotine dependence, cigarettes, uncomplicated: Secondary | ICD-10-CM | POA: Diagnosis not present

## 2018-04-17 NOTE — Progress Notes (Signed)
Shared Decision Making Visit Lung Cancer Screening Program [phone])   Eligibility:  Age 71 y.o.  Pack Years Smoking History Calculation 48 pack year smoking history (# packs/per year x # years smoked)  Recent History of coughing up blood  no  Unexplained weight loss? no ( >Than 15 pounds within the last 6 months )  Prior History Lung / other cancer no (Diagnosis within the last 5 years already requiring surveillance chest CT Scans).  Smoking Status Current Smoker  Former Smokers: Years since quit: NA  Quit Date: NA  Visit Components:  Discussion included one or more decision making aids. yes  Discussion included risk/benefits of screening. yes  Discussion included potential follow up diagnostic testing for abnormal scans. yes  Discussion included meaning and risk of over diagnosis. yes  Discussion included meaning and risk of False Positives. yes  Discussion included meaning of total radiation exposure. yes  Counseling Included:  Importance of adherence to annual lung cancer LDCT screening. yes  Impact of comorbidities on ability to participate in the program. yes  Ability and willingness to under diagnostic treatment. yes  Smoking Cessation Counseling:  Current Smokers:   Discussed importance of smoking cessation. yes  Information about tobacco cessation classes and interventions provided to patient. yes  Patient provided with "ticket" for LDCT Scan. yes  Symptomatic Patient. no  Counseling  Diagnosis Code: Tobacco Use Z72.0  Asymptomatic Patient yes  Counseling (Intermediate counseling: > three minutes counseling) S9702  Former Smokers:   Discussed the importance of maintaining cigarette abstinence. yes  Diagnosis Code: Personal History of Nicotine Dependence. O37.858  Information about tobacco cessation classes and interventions provided to patient. Yes  Patient provided with "ticket" for LDCT Scan. yes  Written Order for Lung Cancer  Screening with LDCT placed in Epic. Yes (CT Chest Lung Cancer Screening Low Dose W/O CM) [id number] Z12.2-Screening of respiratory organs Z87.891-Personal history of nicotine dependence  I have spent 25 minutes of face to face time with [name] and his wife discussing the risks and benefits of lung cancer screening. We viewed a power point together that explained in detail the above noted topics. We paused at intervals to allow for questions to be asked and answered to ensure understanding.We discussed that the single most powerful action that he can take to decrease his risk of developing lung cancer is to quit smoking. We discussed whether or not he is ready to commit to setting a quit date. We discussed options for tools to aid in quitting smoking including nicotine replacement therapy, non-nicotine medications, support groups, Quit Smart classes, and behavior modification. We discussed that often times setting smaller, more achievable goals, such as eliminating 1 cigarette a day for a week and then 2 cigarettes a day for a week can be helpful in slowly decreasing the number of cigarettes smoked. This allows for a sense of accomplishment as well as providing a clinical benefit. I gave him the " Be Stronger Than Your Excuses" card with contact information for community resources, classes, free nicotine replacement therapy, and access to mobile apps, text messaging, and on-line smoking cessation help. I have also given him my card and contact information in the event he needs to contact me. We discussed the time and location of the scan, and that either Doroteo Glassman RN or I will call with the results within 24-48 hours of receiving them. I have offered him  a copy of the power point we viewed  as a resource in the event they  need reinforcement of the concepts we discussed today in the office. The patient verbalized understanding of all of  the above and had no further questions upon leaving the office. They  have my contact information in the event they have any further questions.  I spent 4 minutes counseling on smoking cessation and the health risks of continued tobacco abuse.  I explained to the patient that there has been a high incidence of coronary artery disease noted on these exams. I explained that this is a non-gated exam therefore degree or severity cannot be determined. This patient is not on statin therapy. I have asked the patient to follow-up with their PCP regarding any incidental finding of coronary artery disease and management with diet or medication as their PCP  feels is clinically indicated. The patient verbalized understanding of the above and had no further questions upon completion of the visit.      Magdalen Spatz, NP 04/17/2018 10:31 AM

## 2018-04-23 ENCOUNTER — Other Ambulatory Visit: Payer: Self-pay | Admitting: Acute Care

## 2018-04-23 DIAGNOSIS — Z122 Encounter for screening for malignant neoplasm of respiratory organs: Secondary | ICD-10-CM

## 2018-04-23 DIAGNOSIS — F17210 Nicotine dependence, cigarettes, uncomplicated: Secondary | ICD-10-CM

## 2018-08-10 ENCOUNTER — Emergency Department (HOSPITAL_COMMUNITY)
Admission: EM | Admit: 2018-08-10 | Discharge: 2018-08-10 | Disposition: A | Discharge status: Home / Self Care | Payer: Medicare Other | Attending: Emergency Medicine | Admitting: Emergency Medicine

## 2018-08-10 ENCOUNTER — Emergency Department (HOSPITAL_COMMUNITY): Payer: Medicare Other

## 2018-08-10 ENCOUNTER — Other Ambulatory Visit: Payer: Self-pay

## 2018-08-10 ENCOUNTER — Encounter (HOSPITAL_COMMUNITY): Payer: Self-pay | Admitting: Emergency Medicine

## 2018-08-10 DIAGNOSIS — M79605 Pain in left leg: Secondary | ICD-10-CM

## 2018-08-10 DIAGNOSIS — Z7982 Long term (current) use of aspirin: Secondary | ICD-10-CM | POA: Diagnosis not present

## 2018-08-10 DIAGNOSIS — S76912A Strain of unspecified muscles, fascia and tendons at thigh level, left thigh, initial encounter: Secondary | ICD-10-CM | POA: Insufficient documentation

## 2018-08-10 DIAGNOSIS — Y999 Unspecified external cause status: Secondary | ICD-10-CM | POA: Insufficient documentation

## 2018-08-10 DIAGNOSIS — E119 Type 2 diabetes mellitus without complications: Secondary | ICD-10-CM | POA: Diagnosis not present

## 2018-08-10 DIAGNOSIS — J449 Chronic obstructive pulmonary disease, unspecified: Secondary | ICD-10-CM | POA: Insufficient documentation

## 2018-08-10 DIAGNOSIS — S79922A Unspecified injury of left thigh, initial encounter: Secondary | ICD-10-CM | POA: Diagnosis present

## 2018-08-10 DIAGNOSIS — Z96642 Presence of left artificial hip joint: Secondary | ICD-10-CM | POA: Insufficient documentation

## 2018-08-10 DIAGNOSIS — Y939 Activity, unspecified: Secondary | ICD-10-CM | POA: Insufficient documentation

## 2018-08-10 DIAGNOSIS — X500XXA Overexertion from strenuous movement or load, initial encounter: Secondary | ICD-10-CM | POA: Insufficient documentation

## 2018-08-10 DIAGNOSIS — Z7984 Long term (current) use of oral hypoglycemic drugs: Secondary | ICD-10-CM | POA: Diagnosis not present

## 2018-08-10 DIAGNOSIS — M79652 Pain in left thigh: Secondary | ICD-10-CM | POA: Diagnosis not present

## 2018-08-10 DIAGNOSIS — Y929 Unspecified place or not applicable: Secondary | ICD-10-CM | POA: Insufficient documentation

## 2018-08-10 DIAGNOSIS — Z8572 Personal history of non-Hodgkin lymphomas: Secondary | ICD-10-CM | POA: Insufficient documentation

## 2018-08-10 DIAGNOSIS — Z471 Aftercare following joint replacement surgery: Secondary | ICD-10-CM | POA: Diagnosis not present

## 2018-08-10 DIAGNOSIS — T148XXA Other injury of unspecified body region, initial encounter: Secondary | ICD-10-CM

## 2018-08-10 DIAGNOSIS — F17210 Nicotine dependence, cigarettes, uncomplicated: Secondary | ICD-10-CM | POA: Insufficient documentation

## 2018-08-10 MED ORDER — HYDROCODONE-ACETAMINOPHEN 5-325 MG PO TABS
1.0000 | ORAL_TABLET | Freq: Once | ORAL | Status: AC
  Administered 2018-08-10: 1 via ORAL
  Filled 2018-08-10: qty 1

## 2018-08-10 MED ORDER — METHOCARBAMOL 500 MG PO TABS: 500.0000 mg | ORAL_TABLET | Freq: Two times a day (BID) | ORAL | 0 refills | Status: DC

## 2018-08-10 MED ORDER — METHOCARBAMOL 500 MG PO TABS
1000.0000 mg | ORAL_TABLET | Freq: Once | ORAL | Status: AC
  Administered 2018-08-10: 1000 mg via ORAL
  Filled 2018-08-10: qty 2

## 2018-08-10 NOTE — ED Provider Notes (Signed)
[facility] EMERGENCY DEPARTMENT Provider Note   CSN: [id number] Arrival date & time: 08/10/18  1437     History   Chief Complaint Chief Complaint  Patient presents with  . Leg Pain    HPI [name] is a 72 y.o. male.  [name] is a 72 y.o. Male with a history of A. fib, diabetes, COPD, OSA, and previous hip replacement, presents to the emergency department for evaluation of left thigh pain.  Patient reports just prior to arrival he strained to catch a heavy motor lift as it was were falling off a ramp and heard a pop and had immediate pain over the back of his left thigh.  He reports at first he was concerned that he had injured his hip but the pain seems to be more over the back of the mid thigh, and somewhat over the medial aspect, he is able to rotate his hip from side to side and can bend and extend the knee but has difficulty straightening the leg all the way out at the hip, because this motion causes worsening pain.  He has not noticed any bruising redness or swelling.  Patient takes daily aspirin but is not on blood thinners.  He was able to minimally bear weight but has not been able to walk.  No pain at the knee, calf or ankle.  No numbness or weakness.  Nothing for pain prior to arrival.     Past Medical History:  Diagnosis Date  . Atrial fibrillation ([place])   . Avascular necrosis of bones of both hips ([place])   . 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 Cancer ([place])   . Colonic polyp   . Complication of anesthesia    hard to wake up, "felt like died and revived me"  . COPD (chronic obstructive pulmonary disease) (HCC)    mild PFT abnormalities; normal ABG  . Degenerative disc disease, lumbar    HNP of the LS spine  . Diabetes mellitus    type 2  . GERD (gastroesophageal reflux disease)   . Glaucoma   . H/O hiatal hernia   . Hx MRSA infection 2010   sith splenic abcess  . Non Hodgkin's lymphoma ([place]) 06/2009   chemotherapy until 11/2010  . Obesity   .  Obstructive sleep apnea    treated with CPAP  . Orthostatic hypotension    lightheadness; no definate loss of consciousness  . Pedal edema   . PONV (postoperative nausea and vomiting)   . Splenic abscess 07/2009  . Tobacco abuse    45 pack years    Patient Active Problem List   Diagnosis Date Noted  . Avascular necrosis of hip ([place]) 12/13/2012  . Acute exacerbation of COPD with asthma ([place]) 07/18/2012  . COPD (chronic obstructive pulmonary disease) ([place])   . Orthostatic hypotension   . Obstructive sleep apnea   . Degenerative disc disease, lumbar   . Colonic polyp   . Glaucoma   . DIABETES MELLITUS, TYPE II 01/10/2011  . OBESITY 01/10/2011  . TOBACCO ABUSE 01/10/2011  . Atrial fibrillation ([place]) 01/10/2011  . GASTROESOPHAGEAL REFLUX DISEASE 01/10/2011  . NON-HODGKIN'S LYMPHOMA, HX OF 01/10/2011    Past Surgical History:  Procedure Laterality Date  . ABSCESS DRAINAGE  2010   Splenic  . ANAL FISSURE 68  72years old  . CATARACT EXTRACTION     Right with lens implant  . COLONOSCOPY W/ POLYPECTOMY  2008   with snare polypectomy  .  EYE SURGERY     lens implant  . INGUINAL HERNIA REPAIR Left 03/09/2017   Procedure: OPEN REPAIR LEFT INGUINAL HERNIA;  Surgeon: Arta Bruce Kinsinger, MD;  Location: [facility];  Service: General;  Laterality: Left;  . INSERTION OF MESH Left 03/09/2017   Procedure: INSERTION OF MESH;  Surgeon: Arta Bruce Kinsinger, MD;  Location: [facility];  Service: General;  Laterality: Left;  . KNEE ARTHROSCOPY     right  . LYMPH NODE BIOPSY  2010   done x 2 for NHL diagnosis  . TONSILLECTOMY    . TOTAL HIP ARTHROPLASTY  12/13/2012   Procedure: TOTAL HIP ARTHROPLASTY ANTERIOR APPROACH;  Surgeon: Mcarthur Rossetti, MD;  Location: [facility];  Service: Orthopedics;  Laterality: Left;  Left Total Hip Arthroplasty, Anterior Approach (C-Arm)        Home Medications    Prior to Admission medications   Medication Sig Start Date End Date Taking? Authorizing  Provider  aspirin EC 325 MG tablet Take 1 tablet (325 mg total) by mouth daily. 12/17/12   Mcarthur Rossetti, MD  diltiazem (CARDIZEM CD) 240 MG 24 hr capsule Take 240 mg by mouth daily with breakfast. 12/04/16   [provider]  HYDROcodone-acetaminophen (NORCO/VICODIN) 5-325 MG tablet Take 1-2 tablets by mouth every 6 (six) hours as needed for moderate pain. 03/09/17   Kinsinger, Arta Bruce, MD  ipratropium-albuterol (DUONEB) 0.5-2.5 (3) MG/3ML SOLN Take 3 mLs by nebulization every 6 (six) hours as needed (for wheezing/shortness of breath).    [provider]  latanoprost (XALATAN) 0.005 % ophthalmic solution Place 1 drop into both eyes daily at 10 pm. 12/04/16   [provider]  metFORMIN (GLUCOPHAGE) 500 MG tablet Take 500 mg by mouth 2 (two) times daily.    [provider]  pantoprazole (PROTONIX) 40 MG tablet Take 40 mg by mouth daily. 12/04/16   [provider]  senna (SENOKOT) 8.6 MG tablet Take 1-2 tablets by mouth daily as needed for constipation. Constipation    [provider]  timolol (TIMOPTIC) 0.5 % ophthalmic solution Place 1 drop into both eyes daily. 01/12/17   [provider]    Family History Family History  Problem Relation Age of Onset  . Heart failure Mother 2       results of chf  . Leukemia Father   . Colon cancer Sister   . Heart disease Brother   . Heart disease Brother   . Heart disease Brother   . Heart disease Brother     Social History Social History   Tobacco Use  . Smoking status: Current Every Day Smoker    Packs/day: 1.00    Years: 55.00    Pack years: 55.00    Types: Cigarettes  . Smokeless tobacco: Never Used  Substance Use Topics  . Alcohol use: No  . Drug use: No     Allergies   Codeine and Pred forte [prednisolone acetate]   Review of Systems Review of Systems  Constitutional: Negative for chills and fever.  Cardiovascular: Negative for leg swelling.    Musculoskeletal: Positive for arthralgias, gait problem and myalgias. Negative for joint swelling.  Skin: Negative for color change, rash and wound.  Neurological: Negative for weakness and numbness.  All other systems reviewed and are negative.    Physical Exam Updated Vital Signs BP 132/73 (BP Location: Right Arm)   Pulse 97   Temp 98 F (36.7 C) (Oral)   Ht 6\' 4"  (1.93 m)  Wt 115.7 kg   SpO2 98%   BMI 31.04 kg/m   Physical Exam  Constitutional: He is oriented to person, place, and time. He appears well-developed and well-nourished. No distress.  HENT:  Head: Normocephalic and atraumatic.  Eyes: Right eye exhibits no discharge. Left eye exhibits no discharge.  Pulmonary/Chest: Effort normal. No respiratory distress.  Musculoskeletal:       Left upper leg: He exhibits tenderness. He exhibits no bony tenderness, no swelling, no edema, no deformity and no laceration.       Legs: Tenderness to palpation over the posterior aspect of the left thigh, tender over adductor muscles as well. There is palpable muscle tension, but no obvious muscle bulge to suggest dettachement.  Patient is not able to completely flatten the leg on the bed, but can lift it up.  No bony tenderness over the hip joint.  Foot is not held in internal or external rotation and there is no obvious shortening. Normal ROM w/ internal and external rotation.  All compartments soft. No tenderness or swelling of the knee or ankle, normal ROM. 2+ DP and TP pulses, sensation intact.  Neurological: He is alert and oriented to person, place, and time. Coordination normal.  Skin: Skin is warm and dry. Capillary refill takes less than 2 seconds. He is not diaphoretic.  Psychiatric: He has a normal mood and affect. His behavior is normal.  Nursing note and vitals reviewed.    ED Treatments / Results  Labs (all labs ordered are listed, but only abnormal results are displayed) Labs Reviewed - No data to  display  EKG None  Radiology Dg Hip Unilat W Or Wo Pelvis 2-3 Views Left  Result Date: 08/10/2018 CLINICAL DATA:  Left posterior thigh pain. EXAM: DG HIP (WITH OR WITHOUT PELVIS) 2-3V LEFT COMPARISON:  None. FINDINGS: There is no evidence of fracture or dislocation. Osteoarthritic changes, moderate in severity, of the right hip joint. Post total left hip arthroplasty with long stem femoral component. No evidence of loosening. The orthopedic hardware is in normal alignment. Well corticated osseous fragments, likely posttraumatic or postsurgical, are seen adjacent to the lateral aspect of the left acetabulum. IMPRESSION: Status post total left hip arthroplasty with normal appearance of the orthopedic hardware. Moderate osteoarthritic changes of the right hip. Electronically Signed   By: Fidela Salisbury M.D.   On: 08/10/2018 16:41   Dg Femur 1 View Left  Result Date: 08/10/2018 CLINICAL DATA:  Acute onset pain after catching heavy object EXAM: LEFT FEMUR 1 VIEW COMPARISON:  Pelvis and left hip radiographs August 10, 2018 FINDINGS: Frontal view obtained. Total hip replacement noted on the left with prosthetic components well-seated. No acute fracture or dislocation evident on frontal view. 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 IMPRESSION: No fracture or dislocation on frontal view. Total hip replacement on the left with prosthetic components well-seated. 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 Electronically Signed   By: Lowella Grip III M.D.   On: 08/10/2018 16:39    Procedures Procedures (including critical care time)  Medications Ordered in ED Medications  HYDROcodone-acetaminophen (NORCO/VICODIN) 5-325 MG per tablet 1 tablet (has no administration in time range)  methocarbamol (ROBAXIN) tablet 1,000 mg (1,000 mg Oral Given 08/10/18 1642)     Initial Impression / Assessment and Plan / ED Course  I have reviewed the triage vital signs and  the nursing notes.  Pertinent labs & imaging results that were available during my care of  the patient were reviewed by me and considered in my medical decision making (see chart for details).  Presents for evaluation of pain to the left posterior thigh after straining to catch a heavy object.  Patient has normal range of motion at the hip and knee but is unable to completely straighten out the upper leg and reports the muscles feel very tight, there is no obvious muscle bulge to suggest complete muscle detachment.  The leg is neurovascularly intact without appreciable swelling or deformity.  Patient's history of hip replacement x-rays of the left hip and femur ordered which show total hip replacement is well-seated with no surrounding fracture, there is no dislocation.  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 Aline Brochure with orthopedics.  After Robaxin given here in the department patient has increased range of motion in the leg but still has some muscle tension.  Will provide 1 dose of pain medication here and prescription for Robaxin to go home.  Patient has home pain meds.  Return precautions discussed with the patient he expresses understanding and is in agreement with this plan.  Patient discussed with Dr. Alvino Chapel, who agrees with plan.   Final Clinical Impressions(s) / ED Diagnoses   Final diagnoses:  Muscle strain  Left leg pain    ED Discharge Orders         Ordered    methocarbamol (ROBAXIN) 500 MG tablet  2 times daily     08/10/18 1736           Janet Berlin 08/10/18 1737    Davonna Belling, MD 08/10/18 2316

## 2018-08-10 NOTE — ED Triage Notes (Signed)
Patient c/o left thigh pain. Per patient strained to catch and lift metal motor that was falling off ramp and felt/heard a pop. Patient states unable to straighten leg. Per patient can weight bear.

## 2018-08-10 NOTE — Discharge Instructions (Signed)
I think you have likely strained the hamstring muscle in your left leg, x-rays of your hip and femur look good.  In addition to your home pain medications please use Robaxin, this medicine can cause some drowsiness, do not take before driving.  You can also use ice and heat, as well as some light stretching.  If symptoms are still not improving please follow-up with your primary care doctor or Dr. Aline Brochure with orthopedics for further evaluation.  Return to the emergency department for significantly increased pain, redness and swelling over the area, any numbness or weakness in the leg or any other new or concerning symptoms.

## 2018-09-12 DIAGNOSIS — I482 Chronic atrial fibrillation: Secondary | ICD-10-CM | POA: Diagnosis not present

## 2018-09-12 DIAGNOSIS — I129 Hypertensive chronic kidney disease with stage 1 through stage 4 chronic kidney disease, or unspecified chronic kidney disease: Secondary | ICD-10-CM | POA: Diagnosis not present

## 2018-09-12 DIAGNOSIS — N183 Chronic kidney disease, stage 3 (moderate): Secondary | ICD-10-CM | POA: Diagnosis not present

## 2018-09-12 DIAGNOSIS — E1121 Type 2 diabetes mellitus with diabetic nephropathy: Secondary | ICD-10-CM | POA: Diagnosis not present

## 2018-09-12 DIAGNOSIS — Z23 Encounter for immunization: Secondary | ICD-10-CM | POA: Diagnosis not present

## 2018-09-13 LAB — BASIC METABOLIC PANEL
BUN/Creatinine Ratio: 16
BUN: 17 (ref 4–21)
Calcium: 9.5
Carbon Dioxide, Total: 26
Chloride: 102
Creat: 1.07
EGFR (African American): 80
EGFR (Non-African Amer.): 69
Glucose: 96
Potassium: 5
Sodium: 143

## 2018-09-13 LAB — HEMOGLOBIN A1C: Hgb A1c MFr Bld: 5.9 (ref 4.0–6.0)

## 2018-10-10 DIAGNOSIS — H401123 Primary open-angle glaucoma, left eye, severe stage: Secondary | ICD-10-CM | POA: Diagnosis not present

## 2018-10-10 DIAGNOSIS — H401111 Primary open-angle glaucoma, right eye, mild stage: Secondary | ICD-10-CM | POA: Diagnosis not present

## 2019-03-17 DIAGNOSIS — J449 Chronic obstructive pulmonary disease, unspecified: Secondary | ICD-10-CM | POA: Diagnosis not present

## 2019-03-19 DIAGNOSIS — I10 Essential (primary) hypertension: Secondary | ICD-10-CM | POA: Diagnosis not present

## 2019-03-19 DIAGNOSIS — J449 Chronic obstructive pulmonary disease, unspecified: Secondary | ICD-10-CM | POA: Diagnosis not present

## 2019-03-19 DIAGNOSIS — I2510 Atherosclerotic heart disease of native coronary artery without angina pectoris: Secondary | ICD-10-CM | POA: Diagnosis not present

## 2019-03-19 DIAGNOSIS — I4891 Unspecified atrial fibrillation: Secondary | ICD-10-CM | POA: Diagnosis not present

## 2019-06-05 ENCOUNTER — Other Ambulatory Visit: Payer: Self-pay | Admitting: Acute Care

## 2019-06-05 DIAGNOSIS — F17210 Nicotine dependence, cigarettes, uncomplicated: Secondary | ICD-10-CM

## 2019-06-05 DIAGNOSIS — Z87891 Personal history of nicotine dependence: Secondary | ICD-10-CM

## 2019-06-05 DIAGNOSIS — Z122 Encounter for screening for malignant neoplasm of respiratory organs: Secondary | ICD-10-CM

## 2019-07-22 ENCOUNTER — Other Ambulatory Visit: Payer: Self-pay

## 2019-07-22 ENCOUNTER — Ambulatory Visit (HOSPITAL_COMMUNITY)
Admission: RE | Admit: 2019-07-22 | Discharge: 2019-07-22 | Disposition: A | Discharge status: Home / Self Care | Payer: Medicare Other | Source: Ambulatory Visit | Attending: Acute Care | Admitting: Acute Care

## 2019-07-22 DIAGNOSIS — F17210 Nicotine dependence, cigarettes, uncomplicated: Secondary | ICD-10-CM | POA: Diagnosis present

## 2019-07-22 DIAGNOSIS — Z87891 Personal history of nicotine dependence: Secondary | ICD-10-CM | POA: Diagnosis not present

## 2019-07-22 DIAGNOSIS — Z0000 Encounter for general adult medical examination without abnormal findings: Secondary | ICD-10-CM | POA: Diagnosis not present

## 2019-07-22 DIAGNOSIS — Z122 Encounter for screening for malignant neoplasm of respiratory organs: Secondary | ICD-10-CM | POA: Diagnosis not present

## 2019-08-01 ENCOUNTER — Other Ambulatory Visit: Payer: Self-pay | Admitting: *Deleted

## 2019-08-01 DIAGNOSIS — F17210 Nicotine dependence, cigarettes, uncomplicated: Secondary | ICD-10-CM

## 2019-08-01 DIAGNOSIS — Z122 Encounter for screening for malignant neoplasm of respiratory organs: Secondary | ICD-10-CM

## 2019-08-01 DIAGNOSIS — Z87891 Personal history of nicotine dependence: Secondary | ICD-10-CM

## 2019-08-06 DIAGNOSIS — Z0000 Encounter for general adult medical examination without abnormal findings: Secondary | ICD-10-CM | POA: Diagnosis not present

## 2019-08-06 DIAGNOSIS — K219 Gastro-esophageal reflux disease without esophagitis: Secondary | ICD-10-CM | POA: Diagnosis not present

## 2019-08-06 DIAGNOSIS — H409 Unspecified glaucoma: Secondary | ICD-10-CM | POA: Diagnosis not present

## 2019-08-06 DIAGNOSIS — E119 Type 2 diabetes mellitus without complications: Secondary | ICD-10-CM | POA: Diagnosis not present

## 2019-08-06 DIAGNOSIS — I482 Chronic atrial fibrillation, unspecified: Secondary | ICD-10-CM | POA: Diagnosis not present

## 2019-08-07 LAB — LIPID PANEL
Cholesterol: 124
HDL Cholesterol: 26 — AB (ref 35–70)
LDL Cholesterol: 54
Triglycerides: 221 — AB (ref 40–160)
VLDL Cholesterol Cal: 44

## 2019-08-07 LAB — CBC WITH DIFF/PLATELET
BASO(ABSOLUTE): 0.1
Basophils: 1
Eosinophils Absolute: 0
Eosinophils, %: 2
Hemoglobin: 46.8
Immature Granulocytes: 0
Immature Granulocytes: 0
Lymphocytes: 33
Lymphs Abs: 3.7
MCH: 30.4
MCHC: 32.1
MCV: 95 (ref 76–111)
Monocytes(Absolute): 1
Monocytes: 9
Neutro Abs: 6.4
Neutrophils: 55
RBC: 15 — AB (ref 3.87–5.11)
RDW: 13.7
WBC: 4.93
platelet count: 218

## 2019-08-07 LAB — COMPREHENSIVE METABOLIC PANEL
ALT: 17 (ref 3–30)
AST: 15
Albumin/Globulin Ratio: 2.9
Albumin: 4.3 (ref 3.5–5.0)
Alkaline Phosphatase: 63
BUN/Creatinine Ratio: 16
BUN: 16 (ref 4–21)
Calcium: 9.8
Carbon Dioxide, Total: 24
Chloride: 104
Creat: 1.02
EGFR (African American): 84
EGFR (Non-African Amer.): 73
Globulin, Total: 2.1
Glucose: 83
Potassium: 5.1
Sodium: 141
Total Bilirubin: 0.3
Total Protein: 6.4 (ref 6.4–8.2)

## 2019-08-07 LAB — PSA: Prostate Specific Ag, Serum: 1.4

## 2019-08-10 DIAGNOSIS — Z1212 Encounter for screening for malignant neoplasm of rectum: Secondary | ICD-10-CM | POA: Diagnosis not present

## 2019-08-10 DIAGNOSIS — Z1211 Encounter for screening for malignant neoplasm of colon: Secondary | ICD-10-CM | POA: Diagnosis not present

## 2019-08-10 LAB — COLOGUARD: Cologuard: NEGATIVE

## 2019-08-13 DIAGNOSIS — Z961 Presence of intraocular lens: Secondary | ICD-10-CM | POA: Diagnosis not present

## 2019-08-13 DIAGNOSIS — E119 Type 2 diabetes mellitus without complications: Secondary | ICD-10-CM | POA: Diagnosis not present

## 2019-08-13 DIAGNOSIS — H401111 Primary open-angle glaucoma, right eye, mild stage: Secondary | ICD-10-CM | POA: Diagnosis not present

## 2019-08-13 DIAGNOSIS — H401123 Primary open-angle glaucoma, left eye, severe stage: Secondary | ICD-10-CM | POA: Diagnosis not present

## 2019-08-31 ENCOUNTER — Emergency Department (HOSPITAL_COMMUNITY): Payer: Medicare Other

## 2019-08-31 ENCOUNTER — Encounter (HOSPITAL_COMMUNITY): Payer: Self-pay

## 2019-08-31 ENCOUNTER — Other Ambulatory Visit: Payer: Self-pay

## 2019-08-31 ENCOUNTER — Inpatient Hospital Stay (HOSPITAL_COMMUNITY)
Admission: EM | Admit: 2019-08-31 | Discharge: 2019-09-06 | DRG: 872 | Disposition: A | Discharge status: Home / Self Care | Payer: Medicare Other | Attending: Internal Medicine | Admitting: Internal Medicine

## 2019-08-31 DIAGNOSIS — Z961 Presence of intraocular lens: Secondary | ICD-10-CM | POA: Diagnosis present

## 2019-08-31 DIAGNOSIS — J9611 Chronic respiratory failure with hypoxia: Secondary | ICD-10-CM | POA: Diagnosis present

## 2019-08-31 DIAGNOSIS — K2950 Unspecified chronic gastritis without bleeding: Secondary | ICD-10-CM | POA: Diagnosis present

## 2019-08-31 DIAGNOSIS — K4090 Unilateral inguinal hernia, without obstruction or gangrene, not specified as recurrent: Secondary | ICD-10-CM | POA: Diagnosis not present

## 2019-08-31 DIAGNOSIS — I4821 Permanent atrial fibrillation: Secondary | ICD-10-CM | POA: Diagnosis not present

## 2019-08-31 DIAGNOSIS — G4733 Obstructive sleep apnea (adult) (pediatric): Secondary | ICD-10-CM | POA: Diagnosis not present

## 2019-08-31 DIAGNOSIS — Z9221 Personal history of antineoplastic chemotherapy: Secondary | ICD-10-CM

## 2019-08-31 DIAGNOSIS — Z888 Allergy status to other drugs, medicaments and biological substances status: Secondary | ICD-10-CM

## 2019-08-31 DIAGNOSIS — A419 Sepsis, unspecified organism: Principal | ICD-10-CM | POA: Diagnosis present

## 2019-08-31 DIAGNOSIS — R945 Abnormal results of liver function studies: Secondary | ICD-10-CM

## 2019-08-31 DIAGNOSIS — E662 Morbid (severe) obesity with alveolar hypoventilation: Secondary | ICD-10-CM | POA: Diagnosis present

## 2019-08-31 DIAGNOSIS — K838 Other specified diseases of biliary tract: Secondary | ICD-10-CM | POA: Diagnosis not present

## 2019-08-31 DIAGNOSIS — Z8572 Personal history of non-Hodgkin lymphomas: Secondary | ICD-10-CM | POA: Diagnosis not present

## 2019-08-31 DIAGNOSIS — Z6831 Body mass index (BMI) 31.0-31.9, adult: Secondary | ICD-10-CM

## 2019-08-31 DIAGNOSIS — E1151 Type 2 diabetes mellitus with diabetic peripheral angiopathy without gangrene: Secondary | ICD-10-CM | POA: Diagnosis present

## 2019-08-31 DIAGNOSIS — R6521 Severe sepsis with septic shock: Secondary | ICD-10-CM | POA: Diagnosis not present

## 2019-08-31 DIAGNOSIS — E119 Type 2 diabetes mellitus without complications: Secondary | ICD-10-CM | POA: Diagnosis not present

## 2019-08-31 DIAGNOSIS — K8031 Calculus of bile duct with cholangitis, unspecified, with obstruction: Secondary | ICD-10-CM | POA: Diagnosis present

## 2019-08-31 DIAGNOSIS — R0602 Shortness of breath: Secondary | ICD-10-CM

## 2019-08-31 DIAGNOSIS — I4891 Unspecified atrial fibrillation: Secondary | ICD-10-CM | POA: Diagnosis not present

## 2019-08-31 DIAGNOSIS — J441 Chronic obstructive pulmonary disease with (acute) exacerbation: Secondary | ICD-10-CM | POA: Diagnosis not present

## 2019-08-31 DIAGNOSIS — K831 Obstruction of bile duct: Secondary | ICD-10-CM

## 2019-08-31 DIAGNOSIS — R1110 Vomiting, unspecified: Secondary | ICD-10-CM | POA: Diagnosis not present

## 2019-08-31 DIAGNOSIS — Z20828 Contact with and (suspected) exposure to other viral communicable diseases: Secondary | ICD-10-CM | POA: Diagnosis present

## 2019-08-31 DIAGNOSIS — K3189 Other diseases of stomach and duodenum: Secondary | ICD-10-CM | POA: Diagnosis not present

## 2019-08-31 DIAGNOSIS — E876 Hypokalemia: Secondary | ICD-10-CM | POA: Diagnosis not present

## 2019-08-31 DIAGNOSIS — Z96642 Presence of left artificial hip joint: Secondary | ICD-10-CM | POA: Diagnosis not present

## 2019-08-31 DIAGNOSIS — Z9841 Cataract extraction status, right eye: Secondary | ICD-10-CM

## 2019-08-31 DIAGNOSIS — K5730 Diverticulosis of large intestine without perforation or abscess without bleeding: Secondary | ICD-10-CM | POA: Diagnosis present

## 2019-08-31 DIAGNOSIS — N179 Acute kidney failure, unspecified: Secondary | ICD-10-CM | POA: Diagnosis not present

## 2019-08-31 DIAGNOSIS — K8309 Other cholangitis: Secondary | ICD-10-CM | POA: Diagnosis not present

## 2019-08-31 DIAGNOSIS — H409 Unspecified glaucoma: Secondary | ICD-10-CM | POA: Diagnosis not present

## 2019-08-31 DIAGNOSIS — Z7984 Long term (current) use of oral hypoglycemic drugs: Secondary | ICD-10-CM

## 2019-08-31 DIAGNOSIS — J449 Chronic obstructive pulmonary disease, unspecified: Secondary | ICD-10-CM | POA: Diagnosis present

## 2019-08-31 DIAGNOSIS — T859XXA Unspecified complication of internal prosthetic device, implant and graft, initial encounter: Secondary | ICD-10-CM

## 2019-08-31 DIAGNOSIS — K8051 Calculus of bile duct without cholangitis or cholecystitis with obstruction: Secondary | ICD-10-CM | POA: Diagnosis not present

## 2019-08-31 DIAGNOSIS — K219 Gastro-esophageal reflux disease without esophagitis: Secondary | ICD-10-CM | POA: Diagnosis not present

## 2019-08-31 DIAGNOSIS — N200 Calculus of kidney: Secondary | ICD-10-CM | POA: Diagnosis present

## 2019-08-31 DIAGNOSIS — F17210 Nicotine dependence, cigarettes, uncomplicated: Secondary | ICD-10-CM | POA: Diagnosis present

## 2019-08-31 DIAGNOSIS — K8050 Calculus of bile duct without cholangitis or cholecystitis without obstruction: Secondary | ICD-10-CM | POA: Diagnosis not present

## 2019-08-31 DIAGNOSIS — R1013 Epigastric pain: Secondary | ICD-10-CM | POA: Diagnosis not present

## 2019-08-31 DIAGNOSIS — Z01810 Encounter for preprocedural cardiovascular examination: Secondary | ICD-10-CM | POA: Diagnosis not present

## 2019-08-31 DIAGNOSIS — E7800 Pure hypercholesterolemia, unspecified: Secondary | ICD-10-CM | POA: Diagnosis not present

## 2019-08-31 DIAGNOSIS — K8030 Calculus of bile duct with cholangitis, unspecified, without obstruction: Secondary | ICD-10-CM | POA: Diagnosis not present

## 2019-08-31 DIAGNOSIS — Z885 Allergy status to narcotic agent status: Secondary | ICD-10-CM

## 2019-08-31 DIAGNOSIS — Z7982 Long term (current) use of aspirin: Secondary | ICD-10-CM

## 2019-08-31 DIAGNOSIS — Z86010 Personal history of colonic polyps: Secondary | ICD-10-CM | POA: Diagnosis not present

## 2019-08-31 DIAGNOSIS — R509 Fever, unspecified: Secondary | ICD-10-CM | POA: Diagnosis not present

## 2019-08-31 DIAGNOSIS — F17200 Nicotine dependence, unspecified, uncomplicated: Secondary | ICD-10-CM | POA: Diagnosis present

## 2019-08-31 DIAGNOSIS — Z79899 Other long term (current) drug therapy: Secondary | ICD-10-CM

## 2019-08-31 DIAGNOSIS — I10 Essential (primary) hypertension: Secondary | ICD-10-CM | POA: Diagnosis not present

## 2019-08-31 LAB — URINALYSIS, ROUTINE W REFLEX MICROSCOPIC
Bacteria, UA: NONE SEEN
Glucose, UA: NEGATIVE mg/dL
Ketones, ur: NEGATIVE mg/dL
Leukocytes,Ua: NEGATIVE
Nitrite: NEGATIVE
Protein, ur: 30 mg/dL — AB
Specific Gravity, Urine: >1.046 — ABNORMAL HIGH (ref 1.005–1.030)
pH: 5 (ref 5.0–8.0)

## 2019-08-31 LAB — CBC WITH DIFFERENTIAL/PLATELET
Abs Immature Granulocytes: 0.04 10*3/uL (ref 0.00–0.07)
Basophils Absolute: 0 10*3/uL (ref 0.0–0.1)
Basophils Relative: 0 %
Eosinophils Absolute: 0.1 10*3/uL (ref 0.0–0.5)
Eosinophils Relative: 0 %
HCT: 48.2 % (ref 39.0–52.0)
Hemoglobin: 16.1 g/dL (ref 13.0–17.0)
Immature Granulocytes: 0 %
Lymphocytes Relative: 7 %
Lymphs Abs: 0.9 10*3/uL (ref 0.7–4.0)
MCH: 31.1 pg (ref 26.0–34.0)
MCHC: 33.4 g/dL (ref 30.0–36.0)
MCV: 93.2 fL (ref 80.0–100.0)
Monocytes Absolute: 1.1 10*3/uL — ABNORMAL HIGH (ref 0.1–1.0)
Monocytes Relative: 8 %
Neutro Abs: 11.8 10*3/uL — ABNORMAL HIGH (ref 1.7–7.7)
Neutrophils Relative %: 85 %
Platelets: 175 10*3/uL (ref 150–400)
RBC: 5.17 MIL/uL (ref 4.22–5.81)
RDW: 14.2 % (ref 11.5–15.5)
WBC: 14 10*3/uL — ABNORMAL HIGH (ref 4.0–10.5)
nRBC: 0 % (ref 0.0–0.2)

## 2019-08-31 LAB — COMPREHENSIVE METABOLIC PANEL
ALT: 610 U/L — ABNORMAL HIGH (ref 0–44)
AST: 359 U/L — ABNORMAL HIGH (ref 15–41)
Albumin: 4.1 g/dL (ref 3.5–5.0)
Alkaline Phosphatase: 169 U/L — ABNORMAL HIGH (ref 38–126)
Anion gap: 12 (ref 5–15)
BUN: 21 mg/dL (ref 8–23)
CO2: 24 mmol/L (ref 22–32)
Calcium: 9.2 mg/dL (ref 8.9–10.3)
Chloride: 101 mmol/L (ref 98–111)
Creatinine, Ser: 1.31 mg/dL — ABNORMAL HIGH (ref 0.61–1.24)
GFR calc Af Amer: >60 mL/min (ref >60)
GFR calc non Af Amer: 54 mL/min — ABNORMAL LOW (ref >60)
Glucose, Bld: 142 mg/dL — ABNORMAL HIGH (ref 70–99)
Potassium: 3.8 mmol/L (ref 3.5–5.1)
Sodium: 137 mmol/L (ref 135–145)
Total Bilirubin: 3.5 mg/dL — ABNORMAL HIGH (ref 0.3–1.2)
Total Protein: 7.8 g/dL (ref 6.5–8.1)

## 2019-08-31 LAB — SARS CORONAVIRUS 2 BY RT PCR (HOSPITAL ORDER, PERFORMED IN ~~LOC~~ HOSPITAL LAB): SARS Coronavirus 2: NEGATIVE

## 2019-08-31 LAB — APTT: aPTT: 30 seconds (ref 24–36)

## 2019-08-31 LAB — PROTIME-INR
INR: 1.1 (ref 0.8–1.2)
Prothrombin Time: 14 seconds (ref 11.4–15.2)

## 2019-08-31 LAB — GLUCOSE, CAPILLARY: Glucose-Capillary: 172 mg/dL — ABNORMAL HIGH (ref 70–99)

## 2019-08-31 LAB — LIPASE, BLOOD: Lipase: 23 U/L (ref 11–51)

## 2019-08-31 LAB — CBG MONITORING, ED: Glucose-Capillary: 135 mg/dL — ABNORMAL HIGH (ref 70–99)

## 2019-08-31 LAB — LACTIC ACID, PLASMA
Lactic Acid, Venous: 1.9 mmol/L (ref 0.5–1.9)
Lactic Acid, Venous: 1.8 mmol/L (ref 0.5–1.9)

## 2019-08-31 LAB — TROPONIN I (HIGH SENSITIVITY): Troponin I (High Sensitivity): 11 ng/L (ref <18)

## 2019-08-31 MED ORDER — SENNA 8.6 MG PO TABS: 1.0000 | ORAL_TABLET | Freq: Every day | ORAL | Status: DC | PRN

## 2019-08-31 MED ORDER — PANTOPRAZOLE SODIUM 40 MG IV SOLR
40.0000 mg | Freq: Every day | INTRAVENOUS | Status: DC
  Administered 2019-09-01 – 2019-09-05 (×6): 40 mg via INTRAVENOUS
  Filled 2019-08-31 (×6): qty 40

## 2019-08-31 MED ORDER — SODIUM CHLORIDE 0.9 % IV SOLN
INTRAVENOUS | Status: DC
  Administered 2019-08-31: 21:00:00 via INTRAVENOUS

## 2019-08-31 MED ORDER — PIPERACILLIN-TAZOBACTAM 3.375 G IVPB
3.3750 g | Freq: Three times a day (TID) | INTRAVENOUS | Status: DC
  Administered 2019-08-31 – 2019-09-06 (×18): 3.375 g via INTRAVENOUS
  Filled 2019-08-31 (×18): qty 50

## 2019-08-31 MED ORDER — HEPARIN BOLUS VIA INFUSION
4000.0000 [IU] | Freq: Once | INTRAVENOUS | Status: AC
  Administered 2019-08-31: 21:00:00 4000 [IU] via INTRAVENOUS

## 2019-08-31 MED ORDER — SODIUM CHLORIDE 0.9 % IV BOLUS
1000.0000 mL | Freq: Once | INTRAVENOUS | Status: AC
  Administered 2019-08-31: 1000 mL via INTRAVENOUS

## 2019-08-31 MED ORDER — IPRATROPIUM-ALBUTEROL 0.5-2.5 (3) MG/3ML IN SOLN: 3.0000 mL | Freq: Four times a day (QID) | RESPIRATORY_TRACT | Status: DC | PRN

## 2019-08-31 MED ORDER — HEPARIN (PORCINE) 25000 UT/250ML-% IV SOLN
1400.0000 [IU]/h | INTRAVENOUS | Status: DC
  Administered 2019-08-31 – 2019-09-01 (×2): 1400 [IU]/h via INTRAVENOUS
  Filled 2019-08-31 (×2): qty 250

## 2019-08-31 MED ORDER — DILTIAZEM HCL 100 MG IV SOLR
5.0000 mg/h | INTRAVENOUS | Status: DC
  Administered 2019-08-31 – 2019-09-01 (×2): 5 mg/h via INTRAVENOUS
  Filled 2019-08-31 (×3): qty 100

## 2019-08-31 MED ORDER — VANCOMYCIN HCL IN DEXTROSE 750-5 MG/150ML-% IV SOLN
750.0000 mg | Freq: Two times a day (BID) | INTRAVENOUS | Status: DC
  Administered 2019-09-01: 750 mg via INTRAVENOUS
  Filled 2019-08-31: qty 150

## 2019-08-31 MED ORDER — CHLORHEXIDINE GLUCONATE CLOTH 2 % EX PADS
6.0000 | MEDICATED_PAD | Freq: Every day | CUTANEOUS | Status: DC
  Administered 2019-09-01 – 2019-09-05 (×4): 6 via TOPICAL

## 2019-08-31 MED ORDER — INSULIN ASPART 100 UNIT/ML ~~LOC~~ SOLN
0.0000 [IU] | SUBCUTANEOUS | Status: DC
  Administered 2019-08-31: 21:00:00 1 [IU] via SUBCUTANEOUS
  Administered 2019-09-01: 2 [IU] via SUBCUTANEOUS
  Administered 2019-09-01 (×4): 1 [IU] via SUBCUTANEOUS
  Administered 2019-09-02: 16:00:00 2 [IU] via SUBCUTANEOUS
  Administered 2019-09-02 – 2019-09-06 (×4): 1 [IU] via SUBCUTANEOUS
  Filled 2019-08-31: qty 1

## 2019-08-31 MED ORDER — IOHEXOL 300 MG/ML  SOLN
100.0000 mL | Freq: Once | INTRAMUSCULAR | Status: AC | PRN
  Administered 2019-08-31: 18:00:00 100 mL via INTRAVENOUS

## 2019-08-31 MED ORDER — METHOCARBAMOL 500 MG PO TABS: 500.0000 mg | ORAL_TABLET | Freq: Two times a day (BID) | ORAL | Status: DC | PRN

## 2019-08-31 MED ORDER — SODIUM CHLORIDE 0.9 % IV SOLN
2.0000 g | Freq: Once | INTRAVENOUS | Status: AC
  Administered 2019-08-31: 2 g via INTRAVENOUS
  Filled 2019-08-31: qty 20

## 2019-08-31 MED ORDER — METRONIDAZOLE IN NACL 5-0.79 MG/ML-% IV SOLN
500.0000 mg | Freq: Once | INTRAVENOUS | Status: AC
  Administered 2019-08-31: 500 mg via INTRAVENOUS
  Filled 2019-08-31: qty 100

## 2019-08-31 MED ORDER — TIMOLOL MALEATE 0.5 % OP SOLN
1.0000 [drp] | Freq: Every day | OPHTHALMIC | Status: DC
  Administered 2019-09-01 – 2019-09-06 (×6): 1 [drp] via OPHTHALMIC
  Filled 2019-08-31: qty 5

## 2019-08-31 MED ORDER — LATANOPROST 0.005 % OP SOLN
1.0000 [drp] | Freq: Every day | OPHTHALMIC | Status: DC
  Administered 2019-09-01 – 2019-09-04 (×5): 1 [drp] via OPHTHALMIC
  Filled 2019-08-31: qty 2.5

## 2019-08-31 MED ORDER — VANCOMYCIN HCL IN DEXTROSE 1-5 GM/200ML-% IV SOLN
1000.0000 mg | INTRAVENOUS | Status: DC
  Administered 2019-08-31: 21:00:00 1000 mg via INTRAVENOUS
  Filled 2019-08-31 (×2): qty 200

## 2019-08-31 MED ORDER — ACETAMINOPHEN 325 MG PO TABS
650.0000 mg | ORAL_TABLET | Freq: Once | ORAL | Status: AC
  Administered 2019-08-31: 650 mg via ORAL
  Filled 2019-08-31: qty 2

## 2019-08-31 NOTE — Progress Notes (Signed)
ANTICOAGULATION CONSULT NOTE - Initial Consult  Pharmacy Consult for heparin gtt  Indication: atrial fibrillation  Allergies  Allergen Reactions  . Codeine Other (See Comments)    REACTION: Shakes   . Pred Forte [Prednisolone Acetate] Other (See Comments)    Severe burning    Patient Measurements: Height: 6\' 4"  (193 cm) Weight: 255 lb (115.7 kg) IBW/kg (Calculated) : 86.8 Heparin Dosing Weight: HEPARIN DW (KG): 110.7   Vital Signs: Temp: 102.1 F (38.9 C) (09/06 1837) Temp Source: Oral (09/06 1837) BP: 94/46 (09/06 2011) Pulse Rate: 139 (09/06 2025)  Labs: Recent Labs    08/31/19 1523 08/31/19 1603  HGB 16.1  --   HCT 48.2  --   PLT 175  --   APTT  --  30  LABPROT  --  14.0  INR  --  1.1  CREATININE 1.31*  --     Estimated Creatinine Clearance: 70.9 mL/min (A) (by C-G formula based on SCr of 1.31 mg/dL (H)).   Medical History: Past Medical History:  Diagnosis Date  . Atrial fibrillation ([name])   . Avascular necrosis of bones of both hips ([name])   . 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 Cancer ([name])   . Colonic polyp   . Complication of anesthesia    hard to wake up, "felt like died and revived me"  . COPD (chronic obstructive pulmonary disease) ([facility])    mild PFT abnormalities; normal ABG  . Degenerative disc disease, lumbar    HNP of the LS spine  . Diabetes mellitus    type 2  . GERD (gastroesophageal reflux disease)   . Glaucoma   . H/O hiatal hernia   . Hx MRSA infection 2010   sith splenic abcess  . Non Hodgkin's lymphoma ([name]) 06/2009   chemotherapy until 11/2010  . Obesity   . Obstructive sleep apnea    treated with CPAP  . Orthostatic hypotension    lightheadness; no definate loss of consciousness  . Pedal edema   . PONV (postoperative nausea and vomiting)   . Splenic abscess 07/2009  . Tobacco abuse    45 pack years    Medications:  (Not in a hospital admission)  Scheduled:  . heparin  4,000 Units Intravenous Once  . insulin  aspart  0-9 Units Subcutaneous Q4H   Infusions:  . sodium chloride    . diltiazem (CARDIZEM) infusion    . heparin     PRN:  Anti-infectives (From admission, onward)   Start     Dose/Rate Route Frequency Ordered Stop   08/31/19 1545  cefTRIAXone (ROCEPHIN) 2 g in sodium chloride 0.9 % 100 mL IVPB     2 g 200 mL/hr over 30 Minutes Intravenous  Once 08/31/19 1541 08/31/19 1631   08/31/19 1545  metroNIDAZOLE (FLAGYL) IVPB 500 mg     500 mg 100 mL/hr over 60 Minutes Intravenous  Once 08/31/19 1541 08/31/19 1650      Assessment: [name] a 73 y.o. male requires anticoagulation with a heparin iv infusion for the indication of  atrial fibrillation. Heparin gtt will be started following pharmacy protocol per pharmacy consult. Patient is not on previous oral anticoagulant that will require aPTT/HL correlation before transitioning to only HL monitoring.   Goal of Therapy:  Heparin level 0.3-0.7 units/ml Monitor platelets by anticoagulation protocol: Yes   Plan:  Give 4000 units bolus x 1 Start heparin infusion at 1400 units/hr Check anti-Xa level in 8 hours and daily  while on heparin Continue to monitor H&H and platelets  Heparin level to be drawn in 8 hours for patients >22 years old  [name] 08/31/2019,8:52 PM

## 2019-08-31 NOTE — H&P (Addendum)
TRH H&P    Patient Demographics:    [name], is a 73 y.o. male  MRN: [id number]  DOB - 20-Jul-1946  Admit Date - 08/31/2019  Referring MD/NP/PA:   Lynden Oxford  Outpatient Primary MD for the patient is Sinda Du, MD  Patient coming from:  home  Chief complaint-  Abdominal pain   HPI:    [name]  is a 73 y.o. male, w [name], NonHodgkins Lymphoma,  Dm2, Pafib, Tobacco use,  Copd, OSA on Cpap, presents with c/o abdominal pain x 4 days, worse with food, slight n/v, slight fever, chills.  Pt denies cough, cp, palp, sob, diarrhea, brbpr, dysuria.  Pt notes that urine is darker than normal.    In ED,   CT abd/ pelvis Musculoskeletal: Left hip prosthesis. 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 Lumbar and lower thoracic spine degenerative changes with a stable 25% L3 status vertebral compression deformity with no acute fracture lines or bony retropulsion.  IMPRESSION: 1. Interval mild intrahepatic biliary ductal dilatation and marked dilatation of the proximal common duct with probable small noncalcified stones in the distal common duct. 2. Probable cholelithiasis. 3. 4 mm nonobstructing lower pole right renal calculus. 4. Extensive colonic diverticulosis. 5. Interval repair of the previously demonstrated left inguinal hernia. 6. Small right inguinal hernia containing fat. 7. Right femoral head avascular necrosis and moderate right hip degenerative changes.  CXR IMPRESSION: No acute abnormalities.  Wbc 14.0, Hgb 16.1, Plt 175 Na 137, K 3.8 Bun 21, Creatinine 1.31 Alb 4.1, Ast 359, Alt 610, Alk phos 169 . T. Bili 3.5 Lipase 23 Lactic acid 1.9  PTT 30 INR 1.1  Urinalysis SG >1.046 Rbc 11-20 Wbc 0-5   Blood culture x2  covid-19 negative  Pt will be admitted for sepsis (Fever, tachycardia, hypotension),  and Afib  with RVR.     Review of systems:    In addition to the HPI above,  No Fever-chills,  No Headache, No changes with Vision or hearing, No problems swallowing food or Liquids, No Chest pain, Cough or Shortness of Breath, No Blood in stool or Urine, No dysuria, No new skin rashes or bruises, No new joints pains-aches,  No new weakness, tingling, numbness in any extremity, No recent weight gain or loss, No polyuria, polydypsia or polyphagia, No significant Mental Stressors.  All other systems reviewed and are negative.    Past History of the following :    Past Medical History:  Diagnosis Date  . Atrial fibrillation ([place])   . Avascular necrosis of bones of both hips ([place])   . 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 Cancer ([place])   . Colonic polyp   . Complication of anesthesia    hard to wake up, "felt like died and revived me"  . COPD (chronic obstructive pulmonary disease) (H[facility])    mild PFT abnormalities; normal ABG  . Degenerative disc disease, lumbar    HNP of the LS spine  . Diabetes mellitus  type 2  . GERD (gastroesophageal reflux disease)   . Glaucoma   . H/O hiatal hernia   . Hx MRSA infection 2010   sith splenic abcess  . Non Hodgkin's lymphoma ([place]) 06/2009   chemotherapy until 11/2010  . Obesity   . Obstructive sleep apnea    treated with CPAP  . Orthostatic hypotension    lightheadness; no definate loss of consciousness  . Pedal edema   . PONV (postoperative nausea and vomiting)   . Splenic abscess 07/2009  . Tobacco abuse    45 pack years      Past Surgical History:  Procedure Laterality Date  . ABSCESS DRAINAGE  2010   Splenic  . ANAL FISSURE 60  73years old  . CATARACT EXTRACTION     Right with lens implant  . COLONOSCOPY W/ POLYPECTOMY  2008   with snare polypectomy  . EYE SURGERY     lens implant  . INGUINAL HERNIA REPAIR Left 03/09/2017   Procedure: OPEN REPAIR LEFT INGUINAL HERNIA;  Surgeon: Arta Bruce Kinsinger, MD;  Location: [facility]  ORS;  Service: General;  Laterality: Left;  . INSERTION OF MESH Left 03/09/2017   Procedure: INSERTION OF MESH;  Surgeon: Arta Bruce Kinsinger, MD;  Location: [facility] ORS;  Service: General;  Laterality: Left;  . KNEE ARTHROSCOPY     right  . LYMPH NODE BIOPSY  2010   done x 2 for NHL diagnosis  . TONSILLECTOMY    . TOTAL HIP ARTHROPLASTY  12/13/2012   Procedure: TOTAL HIP ARTHROPLASTY ANTERIOR APPROACH;  Surgeon: Mcarthur Rossetti, MD;  Location: [facility] ORS;  Service: Orthopedics;  Laterality: Left;  Left Total Hip Arthroplasty, Anterior Approach (C-Arm)      Social History:      Social History   Tobacco Use  . Smoking status: Current Every Day Smoker    Packs/day: 1.00    Years: 55.00    Pack years: 55.00    Types: Cigarettes  . Smokeless tobacco: Never Used  Substance Use Topics  . Alcohol use: No       Family History :     Family History  Problem Relation Age of Onset  . Heart failure Mother 72       results of chf  . Leukemia Father   . Colon cancer Sister   . Heart disease Brother   . Heart disease Brother   . Heart disease Brother   . Heart disease Brother        Home Medications:   Prior to Admission medications   Medication Sig Start Date End Date Taking? Authorizing Provider  aspirin EC 325 MG tablet Take 1 tablet (325 mg total) by mouth daily. 12/17/12   Mcarthur Rossetti, MD  diltiazem (CARDIZEM CD) 240 MG 24 hr capsule Take 240 mg by mouth daily with breakfast. 12/04/16   [provider]  HYDROcodone-acetaminophen (NORCO/VICODIN) 5-325 MG tablet Take 1-2 tablets by mouth every 6 (six) hours as needed for moderate pain. 03/09/17   Kinsinger, Arta Bruce, MD  ipratropium-albuterol (DUONEB) 0.5-2.5 (3) MG/3ML SOLN Take 3 mLs by nebulization every 6 (six) hours as needed (for wheezing/shortness of breath).    [provider]  latanoprost (XALATAN) 0.005 % ophthalmic solution Place 1 drop into both eyes daily at 10 pm. 12/04/16    [provider]  metFORMIN (GLUCOPHAGE) 500 MG tablet Take 500 mg by mouth 2 (two) times daily.    [provider]  methocarbamol (ROBAXIN) 500 MG tablet Take  1 tablet (500 mg total) by mouth 2 (two) times daily. 08/10/18   Jacqlyn Larsen, PA-C  pantoprazole (PROTONIX) 40 MG tablet Take 40 mg by mouth daily. 12/04/16   [provider]  senna (SENOKOT) 8.6 MG tablet Take 1-2 tablets by mouth daily as needed for constipation. Constipation    [provider]  timolol (TIMOPTIC) 0.5 % ophthalmic solution Place 1 drop into both eyes daily. 01/12/17   [provider]     Allergies:     Allergies  Allergen Reactions  . Codeine Other (See Comments)    REACTION: Shakes   . Pred Forte [Prednisolone Acetate] Other (See Comments)    Severe burning     Physical Exam:   Vitals  Blood pressure (!) 87/54, pulse (!) 121, temperature (!) 102.1 F (38.9 C), temperature source Oral, resp. rate 11, height _0  (1.93 m), weight 115.7 kg, SpO2 97 %.  1.  General: axoxo3  2. Psychiatric: euthymic  3. Neurologic: cn2-12 intact, reflexes 2+ symmetric, diffuse with no clonus,  Motor 5/5 in all 4 ext  4. HEENMT:  Icteric, pupils 1.75m symmetric, direct, consensual , intact Neck: no jvd , no bruit  5. Respiratory : CTAB  6. Cardiovascular : Irr, irr, s1, s2, no m/g/r  7. Gastrointestinal:  Abd: soft, nt, nd, +bs  8. Skin:  Ext: no c/c/e    9.Musculoskeletal:  Good ROM    Data Review:    CBC Recent Labs  Lab 08/31/19 1523  WBC 14.0*  HGB 16.1  HCT 48.2  PLT 175  MCV 93.2  MCH 31.1  MCHC 33.4  RDW 14.2  LYMPHSABS 0.9  MONOABS 1.1*  EOSABS 0.1  BASOSABS 0.0   ------------------------------------------------------------------------------------------------------------------  Results for orders placed or performed during the hospital encounter of 08/31/19 (from the past 48 hour(s))  CBC with Differential     Status: Abnormal    Collection Time: 08/31/19  3:23 PM  Result Value Ref Range   WBC 14.0 (H) 4.0 - 10.5 K/uL   RBC 5.17 4.22 - 5.81 MIL/uL   Hemoglobin 16.1 13.0 - 17.0 g/dL   HCT 48.2 39.0 - 52.0 %   MCV 93.2 80.0 - 100.0 fL   MCH 31.1 26.0 - 34.0 pg   MCHC 33.4 30.0 - 36.0 g/dL   RDW 14.2 11.5 - 15.5 %   Platelets 175 150 - 400 K/uL   nRBC 0.0 0.0 - 0.2 %   Neutrophils Relative % 85 %   Neutro Abs 11.8 (H) 1.7 - 7.7 K/uL   Lymphocytes Relative 7 %   Lymphs Abs 0.9 0.7 - 4.0 K/uL   Monocytes Relative 8 %   Monocytes Absolute 1.1 (H) 0.1 - 1.0 K/uL   Eosinophils Relative 0 %   Eosinophils Absolute 0.1 0.0 - 0.5 K/uL   Basophils Relative 0 %   Basophils Absolute 0.0 0.0 - 0.1 K/uL   Immature Granulocytes 0 %   Abs Immature Granulocytes 0.04 0.00 - 0.07 K/uL    Comment: Performed at [facility] [address] [place] [id number] Comprehensive metabolic panel     Status: Abnormal   Collection Time: 08/31/19  3:23 PM  Result Value Ref Range   Sodium 137 135 - 145 mmol/L   Potassium 3.8 3.5 - 5.1 mmol/L   Chloride 101 98 - 111 mmol/L   CO2 24 22 - 32 mmol/L   Glucose, Bld 142 (H) 70 - 99 mg/dL   BUN 21 8 - 23  mg/dL   Creatinine, Ser 1.31 (H) 0.61 - 1.24 mg/dL   Calcium 9.2 8.9 - 10.3 mg/dL   Total Protein 7.8 6.5 - 8.1 g/dL   Albumin 4.1 3.5 - 5.0 g/dL   AST 359 (H) 15 - 41 U/L   ALT 610 (H) 0 - 44 U/L   Alkaline Phosphatase 169 (H) 38 - 126 U/L   Total Bilirubin 3.5 (H) 0.3 - 1.2 mg/dL   GFR calc non Af Amer 54 (L) >60 mL/min   GFR calc Af Amer >60 >60 mL/min   Anion gap 12 5 - 15    Comment: Performed at [facility], [address], [place], [place] [address]  Lipase, blood     Status: None   Collection Time: 08/31/19  3:23 PM  Result Value Ref Range   Lipase 23 11 - 51 U/L    Comment: Performed at [facility], [address], [place], [place] [address]  Lactic acid, plasma     Status: None   Collection Time: 08/31/19  4:03 PM  Result Value Ref Range   Lactic Acid, Venous  1.9 0.5 - 1.9 mmol/L    Comment: Performed at [facility], [address], [place], [place] [address]  APTT     Status: None   Collection Time: 08/31/19  4:03 PM  Result Value Ref Range   aPTT 30 24 - 36 seconds    Comment: Performed at [facility], [address] [place], [place] [address]  Protime-INR     Status: None   Collection Time: 08/31/19  4:03 PM  Result Value Ref Range   Prothrombin Time 14.0 11.4 - 15.2 seconds   INR 1.1 0.8 - 1.2    Comment: (NOTE) INR goal varies based on device and disease states. Performed at [facility], [address], [place], [place] [address]   Blood Culture (routine x 2)     Status: None (Preliminary result)   Collection Time: 08/31/19  4:03 PM   Specimen: BLOOD RIGHT ARM  Result Value Ref Range   Specimen Description      BLOOD RIGHT ARM BOTTLES DRAWN AEROBIC AND ANAEROBIC   Special Requests      Blood Culture adequate volume Performed at [facility], [address] [place], [place] [address]    Culture PENDING    Report Status PENDING   Blood Culture (routine x 2)     Status: None (Preliminary result)   Collection Time: 08/31/19  4:13 PM   Specimen: BLOOD RIGHT HAND  Result Value Ref Range   Specimen Description      BLOOD RIGHT HAND BOTTLES DRAWN AEROBIC AND ANAEROBIC   Special Requests      Blood Culture adequate volume Performed at [facility], [address], [place], [place] [address]    Culture PENDING    Report Status PENDING   Lactic acid, plasma     Status: None   Collection Time: 08/31/19  5:39 PM  Result Value Ref Range   Lactic Acid, Venous 1.8 0.5 - 1.9 mmol/L    Comment: Performed at [facility], [address], [place], [place] [address]  Urinalysis, Routine w reflex microscopic     Status: Abnormal   Collection Time: 08/31/19  6:42 PM  Result Value Ref Range   Color, Urine AMBER (A) YELLOW    Comment: BIOCHEMICALS MAY BE AFFECTED BY COLOR   APPearance CLEAR CLEAR   Specific Gravity, Urine >1.046 (H) 1.005 -  1.030   pH 5.0  5.0 - 8.0   Glucose, UA NEGATIVE NEGATIVE mg/dL   Hgb urine dipstick SMALL (A) NEGATIVE   Bilirubin Urine MODERATE (A) NEGATIVE   Ketones, ur NEGATIVE NEGATIVE mg/dL   Protein, ur 30 (A) NEGATIVE mg/dL   Nitrite NEGATIVE NEGATIVE   Leukocytes,Ua NEGATIVE NEGATIVE   RBC / HPF 11-20 0 - 5 RBC/hpf   WBC, UA 0-5 0 - 5 WBC/hpf   Bacteria, UA NONE SEEN NONE SEEN   Squamous Epithelial / LPF 0-5 0 - 5   Mucus PRESENT     Comment: Performed at [facility], [address], [place], [place] [address]  SARS Coronavirus 2 [facility] order, Performed in [facility] hospital lab) Nasopharyngeal Urine, Clean Catch     Status: None   Collection Time: 08/31/19  6:42 PM   Specimen: Urine, Clean Catch; Nasopharyngeal  Result Value Ref Range   SARS Coronavirus 2 NEGATIVE NEGATIVE    Comment: (NOTE) If result is NEGATIVE SARS-CoV-2 target nucleic acids are NOT DETECTED. The SARS-CoV-2 RNA is generally detectable in upper and lower  respiratory specimens during the acute phase of infection. The lowest  concentration of SARS-CoV-2 viral copies this assay can detect is 250  copies / mL. A negative result does not preclude SARS-CoV-2 infection  and should not be used as the sole basis for treatment or other  patient management decisions.  A negative result may occur with  improper specimen collection / handling, submission of specimen other  than nasopharyngeal swab, presence of viral mutation(s) within the  areas targeted by this assay, and inadequate number of viral copies  (<250 copies / mL). A negative result must be combined with clinical  observations, patient history, and epidemiological information. If result is POSITIVE SARS-CoV-2 target nucleic acids are DETECTED. The SARS-CoV-2 RNA is generally detectable in upper and lower  respiratory specimens dur ing the acute phase of infection.  Positive  results are indicative of active infection with SARS-CoV-2.  Clinical  correlation  with patient history and other diagnostic information is  necessary to determine patient infection status.  Positive results do  not rule out bacterial infection or co-infection with other viruses. If result is PRESUMPTIVE POSTIVE SARS-CoV-2 nucleic acids MAY BE PRESENT.   A presumptive positive result was obtained on the submitted specimen  and confirmed on repeat testing.  While 2019 novel coronavirus  (SARS-CoV-2) nucleic acids may be present in the submitted sample  additional confirmatory testing may be necessary for epidemiological  and / or clinical management purposes  to differentiate between  SARS-CoV-2 and other Sarbecovirus currently known to infect humans.  If clinically indicated additional testing with an alternate test  methodology [phone]) is advised. The SARS-CoV-2 RNA is generally  detectable in upper and lower respiratory sp ecimens during the acute  phase of infection. The expected result is Negative. Fact Sheet for Patients:  StrictlyIdeas.no Fact Sheet for Healthcare Providers: BankingDealers.co.za This test is not yet approved or cleared by the [place] FDA and has been authorized for detection and/or diagnosis of SARS-CoV-2 by FDA under an Emergency Use Authorization (EUA).  This EUA will remain in effect (meaning this test can be used) for the duration of the COVID-19 declaration under Section 564(b)(1) of the Act, 21 U.S.C. section 360bbb-3(b)(1), unless the authorization is terminated or revoked sooner. Performed at [facility], [address] [place], [place] [address]   CBG monitoring, ED     Status: Abnormal   Collection Time: 08/31/19  8:41 PM  Result Value  Ref Range   Glucose-Capillary 135 (H) 70 - 99 mg/dL    Chemistries  Recent Labs  Lab 08/31/19 1523  NA 137  K 3.8  CL 101  CO2 24  GLUCOSE 142*  BUN 21  CREATININE 1.31*  CALCIUM 9.2  AST 359*  ALT 610*  ALKPHOS 169*  BILITOT 3.5*    ------------------------------------------------------------------------------------------------------------------  ------------------------------------------------------------------------------------------------------------------ GFR: Estimated Creatinine Clearance: 70.9 mL/min (A) (by C-G formula based on SCr of 1.31 mg/dL (H)). Liver Function Tests: Recent Labs  Lab 08/31/19 1523  AST 359*  ALT 610*  ALKPHOS 169*  BILITOT 3.5*  PROT 7.8  ALBUMIN 4.1   Recent Labs  Lab 08/31/19 1523  LIPASE 23   No results for input(s): AMMONIA in the last 168 hours. Coagulation Profile: Recent Labs  Lab 08/31/19 1603  INR 1.1   Cardiac Enzymes: No results for input(s): CKTOTAL, CKMB, CKMBINDEX, TROPONINI in the last 168 hours. BNP (last 3 results) No results for input(s): PROBNP in the last 8760 hours. HbA1C: No results for input(s): HGBA1C in the last 72 hours. CBG: Recent Labs  Lab 08/31/19 2041  GLUCAP 135*   Lipid Profile: No results for input(s): CHOL, HDL, LDLCALC, TRIG, CHOLHDL, LDLDIRECT in the last 72 hours. Thyroid Function Tests: No results for input(s): TSH, T4TOTAL, FREET4, T3FREE, THYROIDAB in the last 72 hours. Anemia Panel: No results for input(s): VITAMINB12, FOLATE, FERRITIN, TIBC, IRON, RETICCTPCT in the last 72 hours.  --------------------------------------------------------------------------------------------------------------- Urine analysis:    Component Value Date/Time   COLORURINE AMBER (A) 08/31/2019 1842   APPEARANCEUR CLEAR 08/31/2019 1842   LABSPEC >1.046 (H) 08/31/2019 1842   PHURINE 5.0 08/31/2019 1842   GLUCOSEU NEGATIVE 08/31/2019 1842   HGBUR SMALL (A) 08/31/2019 1842   BILIRUBINUR MODERATE (A) 08/31/2019 1842   KETONESUR NEGATIVE 08/31/2019 1842   PROTEINUR 30 (A) 08/31/2019 1842   UROBILINOGEN 0.2 12/09/2012 1145   NITRITE NEGATIVE 08/31/2019 1842   LEUKOCYTESUR NEGATIVE 08/31/2019 1842      Imaging Results:    Ct Abdomen  Pelvis W Contrast  Result Date: 08/31/2019 CLINICAL DATA:  Acute, diffuse abdominal pain with nausea, vomiting and fever. Chills. History of non-Hodgkin's lymphoma. Smoker. EXAM: CT ABDOMEN AND PELVIS WITH CONTRAST TECHNIQUE: Multidetector CT imaging of the abdomen and pelvis was performed using the standard protocol following bolus administration of intravenous contrast. CONTRAST:  160m OMNIPAQUE IOHEXOL 300 MG/ML  SOLN COMPARISON:  07/21/2016 FINDINGS: Lower chest: Tiny amount of pericardial fluid with a maximum thickness of 5 mm, without significant change. Normal sized heart. Mild peribronchial thickening at the lung bases. Hepatobiliary: Interval mild intrahepatic biliary ductal dilatation and dilatation of the common duct. The proximal common duct measures 2.0 cm in maximum diameter. Possible small noncalcified stones in the distal common duct. Probable small noncalcified gallstones in the gallbladder, measuring up to 5 mm in maximum diameter each. No gallbladder wall thickening or pericholecystic fluid. Pancreas: Unremarkable. No pancreatic ductal dilatation or surrounding inflammatory changes. Spleen: Normal in size without focal abnormality. Adrenals/Urinary Tract: Normal appearing adrenal glands. 4 mm lower pole right renal calculus. Unremarkable left kidney, ureters and urinary bladder. Stomach/Bowel: Large number of colonic diverticula without evidence of diverticulitis. Normal appearing stomach, small bowel and appendix. Vascular/Lymphatic: Atheromatous arterial calcifications without aneurysm. No enlarged lymph nodes. Reproductive: Prostate is unremarkable. Other: Interval repair of the previously demonstrated left inguinal hernia. Small right inguinal hernia containing fat. Musculoskeletal: Left hip prosthesis. 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 Lumbar and  lower thoracic spine degenerative changes with a stable 25% L3 status  vertebral compression deformity with no acute fracture lines or bony retropulsion. IMPRESSION: 1. Interval mild intrahepatic biliary ductal dilatation and marked dilatation of the proximal common duct with probable small noncalcified stones in the distal common duct. 2. Probable cholelithiasis. 3. 4 mm nonobstructing lower pole right renal calculus. 4. Extensive colonic diverticulosis. 5. Interval repair of the previously demonstrated left inguinal hernia. 6. Small right inguinal hernia containing fat. 7. Right femoral head avascular necrosis and moderate right hip degenerative changes. Electronically Signed   By: Claudie Revering M.D.   On: 08/31/2019 17:55   Dg Chest Port 1 View  Result Date: 08/31/2019 CLINICAL DATA:  Fever EXAM: PORTABLE CHEST 1 VIEW COMPARISON:  Portable exam 1546 hours compared to 12/17/2012 FINDINGS: Rotated to the RIGHT. Normal heart size, mediastinal contours, and pulmonary vascularity. Lungs clear. No infiltrate, pleural effusion or pneumothorax. Osseous structures unremarkable. IMPRESSION: No acute abnormalities. Electronically Signed   By: Lavonia Dana M.D.   On: 08/31/2019 15:59   ekg Afib at  100, nl axis, Q in v1,v2, no st-t changes c/w ischemia   Assessment & Plan:    Principal Problem:   Sepsis ([place]) Active Problems:   Diabetes ([place])   TOBACCO ABUSE   Atrial fibrillation with RVR (HCC)   COPD (chronic obstructive pulmonary disease) (HCC)   Obstructive sleep apnea   ARF (acute renal failure) (HCC)   Abnormal liver function  Sepsis (Fever, tachycardia, hypotension) Blood culture x2 vanco iv, zosyn iv pharmacy to dose  Abnormal liver function  Check acute hepatitis panel Abx as above Per ED, GI (Dr. Tarri Glenn, [place]) consulted at [facility] but I would please consult GI in AM  Afib with RVR Chads2vasc= 2 Trop I q3hx2 Check tsh Check cardiac echo Hold oral cardizem DC aspirin cardizem GTT Heparin GTT  ARF Hydrate with ns iv Check cmp in am  Copd Cont  Duoneb q6h prn   OSA cpap  Dm2 DC Metformin due to ARF Fsbs ac and qhs, ISS  Glaucoma Cont Tiomoptic Cont Xalatan   Microscopic hematuria ? Secondary to nephrolithiasis,  outpatient evaluation please   DVT Prophylaxis-   Heparin GTT  AM Labs Ordered, also please review Full Orders  Family Communication: Admission, patients condition and plan of care including tests being ordered have been discussed with the patient  who indicate understanding and agree with the plan and Code Status.  Code Status:  FULL CODE per patient,  Daughter present w patient   Admission status: Inpatient: Based on patients clinical presentation and evaluation of above clinical data, I have made determination that patient meets Inpatient criteria at this time.  Pt has septic shock, (fever, tachycardia, hypotension), unclear etiology, most likely gallbladder, and require iv abx, as well as iv  Hydration due to ARF.   pt has high risk of clinical deterioration,  Pt will require > 2 nites stay.    Time spent in minutes : 60 minutes critical care   Jani Gravel M.D on 08/31/2019 at 9:28 PM

## 2019-08-31 NOTE — ED Provider Notes (Signed)
[facility] EMERGENCY DEPARTMENT Provider Note   CSN: [id number] Arrival date & time: 08/31/19  1414     History   Chief Complaint Chief Complaint  Patient presents with  . Chills  . Abdominal Pain    HPI [name] is a 73 y.o. male presenting for evaluation nausea, vomiting, abdominal pain.  Patient states 4 days ago, he started feel poorly.  He reports tremors, nausea, vomiting, and generalized abdominal pain.  He states his abdomen feels bloated like it is full of fluid.  He denies known fever.  He states pain is constant, eating makes it worse.  Pain is also worse when he increases intra-abdominal pressure such as straining or moving.  Nothing makes it better.  He denies chest pain, shortness of breath, cough, urinary symptoms, abnormal BMs. History of COPD, on 2 L O2 at baseline.  History of diabetes.  Denies sick contacts. H/o hernia and anal fissure repair. No other abd surgeries.  Pt sees Dr. Gala Romney with GI.      HPI  Past Medical History:  Diagnosis Date  . Atrial fibrillation ([place])   . Avascular necrosis of bones of both hips ([place])   . 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 Cancer ([place])   . Colonic polyp   . Complication of anesthesia    hard to wake up, "felt like died and revived me"  . COPD (chronic obstructive pulmonary disease) (HCC)    mild PFT abnormalities; normal ABG  . Degenerative disc disease, lumbar    HNP of the LS spine  . Diabetes mellitus    type 2  . GERD (gastroesophageal reflux disease)   . Glaucoma   . H/O hiatal hernia   . Hx MRSA infection 2010   sith splenic abcess  . Non Hodgkin's lymphoma ([place]) 06/2009   chemotherapy until 11/2010  . Obesity   . Obstructive sleep apnea    treated with CPAP  . Orthostatic hypotension    lightheadness; no definate loss of consciousness  . Pedal edema   . PONV (postoperative nausea and vomiting)   . Splenic abscess 07/2009  . Tobacco abuse    45 pack years    Patient Active Problem List    Diagnosis Date Noted  . Sepsis ([place]) 08/31/2019  . ARF (acute renal failure) ([place]) 08/31/2019  . Abnormal liver function 08/31/2019  . Avascular necrosis of hip ([place]) 12/13/2012  . Acute exacerbation of COPD with asthma ([place]) 07/18/2012  . COPD (chronic obstructive pulmonary disease) ([place])   . Orthostatic hypotension   . Obstructive sleep apnea   . Degenerative disc disease, lumbar   . Colonic polyp   . Glaucoma   . Diabetes ([place]) 01/10/2011  . OBESITY 01/10/2011  . TOBACCO ABUSE 01/10/2011  . Atrial fibrillation with RVR ([place]) 01/10/2011  . GASTROESOPHAGEAL REFLUX DISEASE 01/10/2011  . NON-HODGKIN'S LYMPHOMA, HX OF 01/10/2011    Past Surgical History:  Procedure Laterality Date  . ABSCESS DRAINAGE  2010   Splenic  . ANAL FISSURE 3  73years old  . CATARACT EXTRACTION     Right with lens implant  . COLONOSCOPY W/ POLYPECTOMY  2008   with snare polypectomy  . EYE SURGERY     lens implant  . INGUINAL HERNIA REPAIR Left 03/09/2017   Procedure: OPEN REPAIR LEFT INGUINAL HERNIA;  Surgeon: Arta Bruce Kinsinger, MD;  Location: [facility];  Service: General;  Laterality: Left;  . INSERTION OF MESH Left 03/09/2017   Procedure: INSERTION  OF MESH;  Surgeon: Arta Bruce Kinsinger, MD;  Location: [facility];  Service: General;  Laterality: Left;  . KNEE ARTHROSCOPY     right  . LYMPH NODE BIOPSY  2010   done x 2 for NHL diagnosis  . TONSILLECTOMY    . TOTAL HIP ARTHROPLASTY  12/13/2012   Procedure: TOTAL HIP ARTHROPLASTY ANTERIOR APPROACH;  Surgeon: Mcarthur Rossetti, MD;  Location: [facility];  Service: Orthopedics;  Laterality: Left;  Left Total Hip Arthroplasty, Anterior Approach (C-Arm)        Home Medications    Prior to Admission medications   Medication Sig Start Date End Date Taking? Authorizing Provider  aspirin EC 325 MG tablet Take 1 tablet (325 mg total) by mouth daily. 12/17/12  Yes Mcarthur Rossetti, MD  atorvastatin (LIPITOR) 20 MG tablet Take 20 mg by mouth  daily. 05/08/19  Yes [provider]  diltiazem (CARDIZEM CD) 240 MG 24 hr capsule Take 240 mg by mouth daily with breakfast. 12/04/16  Yes [provider]  ipratropium-albuterol (DUONEB) 0.5-2.5 (3) MG/3ML SOLN Take 3 mLs by nebulization every 6 (six) hours as needed (for wheezing/shortness of breath).   Yes [provider]  latanoprost (XALATAN) 0.005 % ophthalmic solution Place 1 drop into both eyes daily at 10 pm. 12/04/16  Yes [provider]  metFORMIN (GLUCOPHAGE) 500 MG tablet Take 500 mg by mouth 2 (two) times daily.   Yes [provider]  methocarbamol (ROBAXIN) 500 MG tablet Take 1 tablet (500 mg total) by mouth 2 (two) times daily. 08/10/18  Yes Jacqlyn Larsen, PA-C  senna (SENOKOT) 8.6 MG tablet Take 1-2 tablets by mouth daily as needed for constipation. Constipation   Yes [provider]  timolol (TIMOPTIC) 0.5 % ophthalmic solution Place 1 drop into both eyes daily. 01/12/17  Yes [provider]    Family History Family History  Problem Relation Age of Onset  . Heart failure Mother 7       results of chf  . Leukemia Father   . Colon cancer Sister   . Heart disease Brother   . Heart disease Brother   . Heart disease Brother   . Heart disease Brother     Social History Social History   Tobacco Use  . Smoking status: Current Every Day Smoker    Packs/day: 1.00    Years: 55.00    Pack years: 55.00    Types: Cigarettes  . Smokeless tobacco: Never Used  Substance Use Topics  . Alcohol use: No  . Drug use: No     Allergies   Codeine and Pred forte [prednisolone acetate]   Review of Systems Review of Systems  Gastrointestinal: Positive for abdominal distention, abdominal pain, nausea and vomiting.  Neurological: Positive for tremors.  All other systems reviewed and are negative.    Physical Exam Updated Vital Signs BP (!) 105/57   Pulse 75   Temp (!) 102.1 F (38.9 C) (Oral)   Resp 18   Ht 6'  4" (1.93 m)   Wt 115.7 kg   SpO2 94%   BMI 31.04 kg/m   Physical Exam Vitals signs and nursing note reviewed.  Constitutional:      General: He is not in acute distress.    Appearance: He is well-developed.     Comments: Elderly male who appears uncomfortable due to pain.  Warm to the touch.  Mildly tachycardic.  HENT:     Head: Normocephalic and atraumatic.  Eyes:  Conjunctiva/sclera: Conjunctivae normal.     Pupils: Pupils are equal, round, and reactive to light.  Neck:     Musculoskeletal: Normal range of motion and neck supple.  Cardiovascular:     Rate and Rhythm: Normal rate and regular rhythm.  Pulmonary:     Effort: Pulmonary effort is normal. No respiratory distress.     Breath sounds: Normal breath sounds. No wheezing.  Abdominal:     General: There is no distension.     Palpations: Abdomen is soft.     Tenderness: There is abdominal tenderness.     Comments: Generalized tenderness palpation the abdomen.  Soft without rigidity or guarding.  Obese abdomen, difficulty to assess for distention.  Musculoskeletal: Normal range of motion.  Skin:    General: Skin is warm and dry.     Capillary Refill: Capillary refill takes less than 2 seconds.  Neurological:     Mental Status: He is alert and oriented to person, place, and time.      ED Treatments / Results  Labs (all labs ordered are listed, but only abnormal results are displayed) Labs Reviewed  CBC WITH DIFFERENTIAL/PLATELET - Abnormal; Notable for the following components:      Result Value   WBC 14.0 (*)    Neutro Abs 11.8 (*)    Monocytes Absolute 1.1 (*)    All other components within normal limits  COMPREHENSIVE METABOLIC PANEL - Abnormal; Notable for the following components:   Glucose, Bld 142 (*)    Creatinine, Ser 1.31 (*)    AST 359 (*)    ALT 610 (*)    Alkaline Phosphatase 169 (*)    Total Bilirubin 3.5 (*)    GFR calc non Af Amer 54 (*)    All other components within normal limits   URINALYSIS, ROUTINE W REFLEX MICROSCOPIC - Abnormal; Notable for the following components:   Color, Urine AMBER (*)    Specific Gravity, Urine >1.046 (*)    Hgb urine dipstick SMALL (*)    Bilirubin Urine MODERATE (*)    Protein, ur 30 (*)    All other components within normal limits  CULTURE, BLOOD (ROUTINE X 2)  CULTURE, BLOOD (ROUTINE X 2)  SARS CORONAVIRUS 2 (HOSPITAL ORDER, [facility])  URINE CULTURE  LIPASE, BLOOD  LACTIC ACID, PLASMA  LACTIC ACID, PLASMA  APTT  PROTIME-INR  HEMOGLOBIN A1C  TSH  HEPATITIS PANEL, ACUTE  COMPREHENSIVE METABOLIC PANEL  CBC  TROPONIN I (HIGH SENSITIVITY)    EKG EKG Interpretation  Date/Time:  Sunday August 31 2019 15:55:21 EDT Ventricular Rate:  100 PR Interval:    QRS Duration: 100 QT Interval:  355 QTC Calculation: 458 R Axis:   75 Text Interpretation:  Atrial fibrillation Minimal ST depression, inferior leads Confirmed by Sherwood Gambler [phone]) on 08/31/2019 3:57:32 PM   Radiology Ct Abdomen Pelvis W Contrast  Result Date: 08/31/2019 CLINICAL DATA:  Acute, diffuse abdominal pain with nausea, vomiting and fever. Chills. History of non-Hodgkin's lymphoma. Smoker. EXAM: CT ABDOMEN AND PELVIS WITH CONTRAST TECHNIQUE: Multidetector CT imaging of the abdomen and pelvis was performed using the standard protocol following bolus administration of intravenous contrast. CONTRAST:  141mL OMNIPAQUE IOHEXOL 300 MG/ML  SOLN COMPARISON:  07/21/2016 FINDINGS: Lower chest: Tiny amount of pericardial fluid with a maximum thickness of 5 mm, without significant change. Normal sized heart. Mild peribronchial thickening at the lung bases. Hepatobiliary: Interval mild intrahepatic biliary ductal dilatation and dilatation of the common duct. The proximal  common duct measures 2.0 cm in maximum diameter. Possible small noncalcified stones in the distal common duct. Probable small noncalcified gallstones in the gallbladder, measuring up  to 5 mm in maximum diameter each. No gallbladder wall thickening or pericholecystic fluid. Pancreas: Unremarkable. No pancreatic ductal dilatation or surrounding inflammatory changes. Spleen: Normal in size without focal abnormality. Adrenals/Urinary Tract: Normal appearing adrenal glands. 4 mm lower pole right renal calculus. Unremarkable left kidney, ureters and urinary bladder. Stomach/Bowel: Large number of colonic diverticula without evidence of diverticulitis. Normal appearing stomach, small bowel and appendix. Vascular/Lymphatic: Atheromatous arterial calcifications without aneurysm. No enlarged lymph nodes. Reproductive: Prostate is unremarkable. Other: Interval repair of the previously demonstrated left inguinal hernia. Small right inguinal hernia containing fat. Musculoskeletal: Left hip prosthesis. 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 Lumbar and lower thoracic spine degenerative changes with a stable 25% L3 status vertebral compression deformity with no acute fracture lines or bony retropulsion. IMPRESSION: 1. Interval mild intrahepatic biliary ductal dilatation and marked dilatation of the proximal common duct with probable small noncalcified stones in the distal common duct. 2. Probable cholelithiasis. 3. 4 mm nonobstructing lower pole right renal calculus. 4. Extensive colonic diverticulosis. 5. Interval repair of the previously demonstrated left inguinal hernia. 6. Small right inguinal hernia containing fat. 7. Right femoral head avascular necrosis and moderate right hip degenerative changes. Electronically Signed   By: Claudie Revering M.D.   On: 08/31/2019 17:55   Dg Chest Port 1 View  Result Date: 08/31/2019 CLINICAL DATA:  Fever EXAM: PORTABLE CHEST 1 VIEW COMPARISON:  Portable exam 1546 hours compared to 12/17/2012 FINDINGS: Rotated to the RIGHT. Normal heart size, mediastinal contours, and pulmonary vascularity. Lungs clear. No  infiltrate, pleural effusion or pneumothorax. Osseous structures unremarkable. IMPRESSION: No acute abnormalities. Electronically Signed   By: Lavonia Dana M.D.   On: 08/31/2019 15:59    Procedures .Critical Care Performed by: Franchot Heidelberg, PA-C Authorized by: Franchot Heidelberg, PA-C   Critical care provider statement:    Critical care time (minutes):  40   Critical care time was exclusive of:  Teaching time and separately billable procedures and treating other patients   Critical care was necessary to treat or prevent imminent or life-threatening deterioration of the following conditions:  Sepsis   Critical care was time spent personally by me on the following activities:  Blood draw for specimens, development of treatment plan with patient or surrogate, discussions with consultants, evaluation of patient's response to treatment, examination of patient, obtaining history from patient or surrogate, ordering and performing treatments and interventions, ordering and review of laboratory studies, ordering and review of radiographic studies, pulse oximetry, re-evaluation of patient's condition and review of old charts   I assumed direction of critical care for this patient from another provider in my specialty: no   Comments:     Pt septic. IV abx started and pt admitted   (including critical care time)  Medications Ordered in ED Medications  insulin aspart (novoLOG) injection 0-9 Units (has no administration in time range)  diltiazem (CARDIZEM) 100 mg in dextrose 5 % 100 mL (1 mg/mL) infusion (has no administration in time range)  0.9 %  sodium chloride infusion (has no administration in time range)  acetaminophen (TYLENOL) tablet 650 mg (650 mg Oral Given 08/31/19 1550)  cefTRIAXone (ROCEPHIN) 2 g in sodium chloride 0.9 % 100 mL IVPB ( Intravenous Stopped 08/31/19 1631)  metroNIDAZOLE (FLAGYL) IVPB 500 mg ( Intravenous Stopped 08/31/19  1650)  iohexol (OMNIPAQUE) 300 MG/ML solution 100 mL (100  mLs Intravenous Contrast Given 08/31/19 1736)  sodium chloride 0.9 % bolus 1,000 mL (1,000 mLs Intravenous New Bag/Given 08/31/19 2011)     Initial Impression / Assessment and Plan / ED Course  I have reviewed the triage vital signs and the nursing notes.  Pertinent labs & imaging results that were available during my care of the patient were reviewed by me and considered in my medical decision making (see chart for details).        Patient resenting for evaluation nausea, vomiting, abdominal pain, and shakes.  Physical exam shows patient appears uncomfortable due to pain.  Warm to touch.  Oral temperature normal, however considering tachycardia and tactile temperature, will order rectal.  Will obtain abdominal labs.  Concern for liver abnormality due to reports of generalized abdominal swelling.  Also consider GERD vs perf versus pud vs colitis versus diverticulitis.  Rectal temperature 103, indicating tremors are likely actually rigors.  Tylenol given.  White count 14.  As such, patient meets sirs criteria.  Code sepsis called and intra-abdominal antibiotics started.  Blood pressure stable, no septic shock.  Will not give 30 cc per kg at this time. Additional sepsis labs started.   Lactic normal at 1.8.  Liver enzymes elevated, and T bili 3.5.  As such, likely gallbladder/liver etiology.  CT abdomen pelvis pending. Chest x-ray viewed interpreted by me, no pneumonia, pneumothorax and effusion.  CT shows gallstones with likely stone in the bile ducts.  Gallbladder distention.  No obvious cholecystitis.  However, considering infectious/septic symptoms, patient will need to be admitted.  Will consult with GI.  Discussed with Dr. Oneida Alar from GI, who states patient will need to be admitted for an ERCP, however that is not available at this hospital.  Will need to be transferred.  Discussed with Dr. Tarri Glenn from with our GI at [facility] who will agreed to see the patient in consult.  Will call for  admission.  Discussed with Dr. Maudie Mercury from triad hospitalist service, patient to be admitted to [facility].    Final Clinical Impressions(s) / ED Diagnoses   Final diagnoses:  Sepsis, due to unspecified organism, unspecified whether acute organ dysfunction present [facility])  Common biliary duct obstruction    ED Discharge Orders    None       Franchot Heidelberg, PA-C 08/31/19 2032    Sherwood Gambler, MD 08/31/19 2317

## 2019-08-31 NOTE — ED Triage Notes (Signed)
Pt reports that he has been running fever, chills and abdominal pain for at least 4 days. Pt reports feeling bloated. States chills are the worst and reports HA

## 2019-08-31 NOTE — Progress Notes (Signed)
Pharmacy Antibiotic Note  [name] is a 73 y.o. male admitted on 08/31/2019 with sepsis.  Pharmacy has been consulted for zosyn and vancomycin dosing.  Plan: Vancomycin 750mg  IV every 12 hours.  Goal trough 15-20 mcg/mL. Zosyn 3.375g IV q8h (4 hour infusion).  Height: 6\' 4"  (193 cm) Weight: 255 lb (115.7 kg) IBW/kg (Calculated) : 86.8  Temp (24hrs), Avg:101.3 F (38.5 C), Min:98.4 F (36.9 C), Max:103.5 F (39.7 C)  Recent Labs  Lab 08/31/19 1523 08/31/19 1603 08/31/19 1739  WBC 14.0*  --   --   CREATININE 1.31*  --   --   LATICACIDVEN  --  1.9 1.8    Estimated Creatinine Clearance: 70.9 mL/min (A) (by C-G formula based on SCr of 1.31 mg/dL (H)).    Allergies  Allergen Reactions  . Codeine Other (See Comments)    REACTION: Shakes   . Pred Forte [Prednisolone Acetate] Other (See Comments)    Severe burning    Antimicrobials this admission: 9/6 vancomycin >>  9/6 zosyn >>    Microbiology results: 9/6 BCx: sent 9/6 UCx: sent   Thank you for allowing pharmacy to be a part of this patient's care.  [name] 08/31/2019 8:57 PM

## 2019-08-31 NOTE — ED Notes (Signed)
[facility] received report and ETA of 5 min.

## 2019-09-01 ENCOUNTER — Inpatient Hospital Stay (HOSPITAL_COMMUNITY): Payer: Medicare Other

## 2019-09-01 ENCOUNTER — Other Ambulatory Visit: Payer: Self-pay

## 2019-09-01 DIAGNOSIS — R6521 Severe sepsis with septic shock: Secondary | ICD-10-CM

## 2019-09-01 DIAGNOSIS — K8309 Other cholangitis: Secondary | ICD-10-CM

## 2019-09-01 DIAGNOSIS — R945 Abnormal results of liver function studies: Secondary | ICD-10-CM

## 2019-09-01 DIAGNOSIS — I4891 Unspecified atrial fibrillation: Secondary | ICD-10-CM

## 2019-09-01 DIAGNOSIS — A419 Sepsis, unspecified organism: Principal | ICD-10-CM

## 2019-09-01 DIAGNOSIS — K831 Obstruction of bile duct: Secondary | ICD-10-CM

## 2019-09-01 DIAGNOSIS — N179 Acute kidney failure, unspecified: Secondary | ICD-10-CM

## 2019-09-01 LAB — COMPREHENSIVE METABOLIC PANEL
ALT: 446 U/L — ABNORMAL HIGH (ref 0–44)
AST: 208 U/L — ABNORMAL HIGH (ref 15–41)
Albumin: 3.6 g/dL (ref 3.5–5.0)
Alkaline Phosphatase: 165 U/L — ABNORMAL HIGH (ref 38–126)
Anion gap: 10 (ref 5–15)
BUN: 23 mg/dL (ref 8–23)
CO2: 24 mmol/L (ref 22–32)
Calcium: 8.4 mg/dL — ABNORMAL LOW (ref 8.9–10.3)
Chloride: 105 mmol/L (ref 98–111)
Creatinine, Ser: 1.43 mg/dL — ABNORMAL HIGH (ref 0.61–1.24)
GFR calc Af Amer: 56 mL/min — ABNORMAL LOW (ref >60)
GFR calc non Af Amer: 49 mL/min — ABNORMAL LOW (ref >60)
Glucose, Bld: 112 mg/dL — ABNORMAL HIGH (ref 70–99)
Potassium: 3.7 mmol/L (ref 3.5–5.1)
Sodium: 139 mmol/L (ref 135–145)
Total Bilirubin: 4.2 mg/dL — ABNORMAL HIGH (ref 0.3–1.2)
Total Protein: 6.6 g/dL (ref 6.5–8.1)

## 2019-09-01 LAB — CBC
HCT: 42.8 % (ref 39.0–52.0)
Hemoglobin: 13.7 g/dL (ref 13.0–17.0)
MCH: 30.3 pg (ref 26.0–34.0)
MCHC: 32 g/dL (ref 30.0–36.0)
MCV: 94.7 fL (ref 80.0–100.0)
Platelets: 132 10*3/uL — ABNORMAL LOW (ref 150–400)
RBC: 4.52 MIL/uL (ref 4.22–5.81)
RDW: 14.7 % (ref 11.5–15.5)
WBC: 14.2 10*3/uL — ABNORMAL HIGH (ref 4.0–10.5)
nRBC: 0 % (ref 0.0–0.2)

## 2019-09-01 LAB — ECHOCARDIOGRAM COMPLETE
Height: 76 in
Weight: 3982.39 oz

## 2019-09-01 LAB — MRSA PCR SCREENING: MRSA by PCR: NEGATIVE

## 2019-09-01 LAB — GLUCOSE, CAPILLARY
Glucose-Capillary: 105 mg/dL — ABNORMAL HIGH (ref 70–99)
Glucose-Capillary: 111 mg/dL — ABNORMAL HIGH (ref 70–99)
Glucose-Capillary: 136 mg/dL — ABNORMAL HIGH (ref 70–99)
Glucose-Capillary: 137 mg/dL — ABNORMAL HIGH (ref 70–99)
Glucose-Capillary: 137 mg/dL — ABNORMAL HIGH (ref 70–99)
Glucose-Capillary: 146 mg/dL — ABNORMAL HIGH (ref 70–99)

## 2019-09-01 LAB — HEPARIN LEVEL (UNFRACTIONATED)
Heparin Unfractionated: 0.31 IU/mL (ref 0.30–0.70)
Heparin Unfractionated: 0.24 IU/mL — ABNORMAL LOW (ref 0.30–0.70)

## 2019-09-01 MED ORDER — ORAL CARE MOUTH RINSE: 15.0000 mL | Freq: Two times a day (BID) | OROMUCOSAL | Status: DC

## 2019-09-01 MED ORDER — SODIUM CHLORIDE 0.9 % IV SOLN
INTRAVENOUS | Status: DC
  Administered 2019-09-01 – 2019-09-03 (×2): via INTRAVENOUS

## 2019-09-01 MED ORDER — BOOST / RESOURCE BREEZE PO LIQD CUSTOM
1.0000 | Freq: Three times a day (TID) | ORAL | Status: DC
  Administered 2019-09-01 – 2019-09-06 (×7): 1 via ORAL

## 2019-09-01 MED ORDER — ORAL CARE MOUTH RINSE
15.0000 mL | Freq: Two times a day (BID) | OROMUCOSAL | Status: DC
  Administered 2019-09-05 – 2019-09-06 (×3): 15 mL via OROMUCOSAL

## 2019-09-01 MED ORDER — CHLORHEXIDINE GLUCONATE 0.12 % MT SOLN
15.0000 mL | Freq: Two times a day (BID) | OROMUCOSAL | Status: DC
  Administered 2019-09-01 (×2): 15 mL via OROMUCOSAL
  Filled 2019-09-01 (×2): qty 15

## 2019-09-01 MED ORDER — OXYCODONE HCL 5 MG PO TABS
5.0000 mg | ORAL_TABLET | Freq: Four times a day (QID) | ORAL | Status: DC | PRN
  Administered 2019-09-01 – 2019-09-05 (×8): 5 mg via ORAL
  Filled 2019-09-01 (×8): qty 1

## 2019-09-01 MED ORDER — HEPARIN (PORCINE) 25000 UT/250ML-% IV SOLN: 1600.0000 [IU]/h | INTRAVENOUS | Status: AC

## 2019-09-01 MED ORDER — MORPHINE SULFATE (PF) 2 MG/ML IV SOLN
1.0000 mg | Freq: Once | INTRAVENOUS | Status: AC
  Administered 2019-09-01: 07:00:00 1 mg via INTRAVENOUS
  Filled 2019-09-01: qty 1

## 2019-09-01 MED ORDER — SODIUM CHLORIDE 0.9 % IV SOLN
INTRAVENOUS | Status: DC | PRN
  Administered 2019-09-01: 09:00:00 1000 mL via INTRAVENOUS
  Administered 2019-09-02: 13:00:00 250 mL via INTRAVENOUS

## 2019-09-01 NOTE — Consult Note (Signed)
Referring Provider:  [facility] Primary Care Physician:  Sinda Du, MD Primary Gastroenterologist:  Marena Chancy, had EUS with Dr. Ardis Hughs in 2010  Reason for Consultation:  Biliary sepsis/obstruction from CBD stones  HPI: [name] is a 73 y.o. male w PMH for Gerd, NonHodgkins Lymphoma, Dm2, Paroxysmal atrial fib, Tobacco use, Copd, OSA on Cpap, who presented to [facility] with complaints of feverish feeling, shaking chills and abdominal pain for 5 days prior to presentation.  He says that he always has abdominal pain all over but pain got worse on Tuesday and was radiating to his back.  Could not get rid of feverish feeling or shaking chills so went to the ED.  Upon evaluation he was found to have the following:  Wbc 14.0, Hgb 16.1, Plt 175 Na 137, K 3.8 Bun 21, Creatinine 1.31 Alb 4.1, Ast 359, Alt 610, Alk phos 169 . T. Bili 3.5 Lipase 23 Lactic acid 1.9  PTT 30 INR 1.1  Urinalysis SG >1.046 Rbc 11-20 Wbc 0-5   Blood culture x 2 collected  covid-19 negative  CT scan of the abdomen and pelvis with contrast was performed and showed the following:  IMPRESSION: 1. Interval mild intrahepatic biliary ductal dilatation and marked dilatation of the proximal common duct with probable small noncalcified stones in the distal common duct. 2. Probable cholelithiasis. 3. 4 mm nonobstructing lower pole right renal calculus. 4. Extensive colonic diverticulosis. 5. Interval repair of the previously demonstrated left inguinal hernia. 6. Small right inguinal hernia containing fat. 7. Right femoral head avascular necrosis and moderate right hip degenerative changes.  Temp 103.5 overnight.  Labs from the AM still pending.  Received Flagyl and Rocephin.  Now on Zosyn.  Is on heparin gtt for atrial fibrillation.   Past Medical History:  Diagnosis Date  . Atrial fibrillation ([place])   . Avascular necrosis of bones of both hips ([place])   . 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  Cancer ([place])   . Colonic polyp   . Complication of anesthesia    hard to wake up, "felt like died and revived me"  . COPD (chronic obstructive pulmonary disease) ([facility])    mild PFT abnormalities; normal ABG  . Degenerative disc disease, lumbar    HNP of the LS spine  . Diabetes mellitus    type 2  . GERD (gastroesophageal reflux disease)   . Glaucoma   . H/O hiatal hernia   . Hx MRSA infection 2010   sith splenic abcess  . Non Hodgkin's lymphoma ([place]) 06/2009   chemotherapy until 11/2010  . Obesity   . Obstructive sleep apnea    treated with CPAP  . Orthostatic hypotension    lightheadness; no definate loss of consciousness  . Pedal edema   . PONV (postoperative nausea and vomiting)   . Splenic abscess 07/2009  . Tobacco abuse    45 pack years    Past Surgical History:  Procedure Laterality Date  . ABSCESS DRAINAGE  2010   Splenic  . ANAL FISSURE 43  73years old  . CATARACT EXTRACTION     Right with lens implant  . COLONOSCOPY W/ POLYPECTOMY  2008   with snare polypectomy  . EYE SURGERY     lens implant  . INGUINAL HERNIA REPAIR Left 03/09/2017   Procedure: OPEN REPAIR LEFT INGUINAL HERNIA;  Surgeon: Arta Bruce Kinsinger, MD;  Location: [facility];  Service: General;  Laterality: Left;  . INSERTION OF MESH Left 03/09/2017  Procedure: INSERTION OF MESH;  Surgeon: Arta Bruce Kinsinger, MD;  Location: [facility];  Service: General;  Laterality: Left;  . KNEE ARTHROSCOPY     right  . LYMPH NODE BIOPSY  2010   done x 2 for NHL diagnosis  . TONSILLECTOMY    . TOTAL HIP ARTHROPLASTY  12/13/2012   Procedure: TOTAL HIP ARTHROPLASTY ANTERIOR APPROACH;  Surgeon: Mcarthur Rossetti, MD;  Location: [facility];  Service: Orthopedics;  Laterality: Left;  Left Total Hip Arthroplasty, Anterior Approach (C-Arm)    Prior to Admission medications   Medication Sig Start Date End Date Taking? Authorizing Provider  aspirin EC 325 MG tablet Take 1 tablet (325 mg total) by mouth daily. 12/17/12   Yes Mcarthur Rossetti, MD  atorvastatin (LIPITOR) 20 MG tablet Take 20 mg by mouth daily. 05/08/19  Yes [provider]  diltiazem (CARDIZEM CD) 240 MG 24 hr capsule Take 240 mg by mouth daily with breakfast. 12/04/16  Yes [provider]  ipratropium-albuterol (DUONEB) 0.5-2.5 (3) MG/3ML SOLN Take 3 mLs by nebulization every 6 (six) hours as needed (for wheezing/shortness of breath).   Yes [provider]  latanoprost (XALATAN) 0.005 % ophthalmic solution Place 1 drop into both eyes daily at 10 pm. 12/04/16  Yes [provider]  metFORMIN (GLUCOPHAGE) 500 MG tablet Take 500 mg by mouth 2 (two) times daily.   Yes [provider]  methocarbamol (ROBAXIN) 500 MG tablet Take 1 tablet (500 mg total) by mouth 2 (two) times daily. 08/10/18  Yes Jacqlyn Larsen, PA-C  senna (SENOKOT) 8.6 MG tablet Take 1-2 tablets by mouth daily as needed for constipation. Constipation   Yes [provider]  timolol (TIMOPTIC) 0.5 % ophthalmic solution Place 1 drop into both eyes daily. 01/12/17  Yes [provider]    Current Facility-Administered Medications  Medication Dose Route Frequency Provider Last Rate Last Dose  . 0.9 %  sodium chloride infusion   Intravenous PRN Georgette Shell, MD 10 mL/hr at 09/01/19 0911 1,000 mL at 09/01/19 0911  . chlorhexidine (PERIDEX) 0.12 % solution 15 mL  15 mL Mouth Rinse BID Jani Gravel, MD   15 mL at 09/01/19 0905  . Chlorhexidine Gluconate Cloth 2 % PADS 6 each  6 each Topical Daily Jani Gravel, MD      . diltiazem (CARDIZEM) 100 mg in dextrose 5 % 100 mL (1 mg/mL) infusion  5-15 mg/hr Intravenous Titrated Jani Gravel, MD 5 mL/hr at 08/31/19 2218 5 mg/hr at 08/31/19 2218  . heparin ADULT infusion 100 units/mL (25000 units/262m sodium chloride 0.45%)  1,400 Units/hr Intravenous Continuous Coffee, [facility]na Christ[facility] mL/hr at 08/31/19 2128 1,400 Units/hr at 08/31/19 2128  . insulin aspart (novoLOG) injection 0-9 Units   0-9 Units Subcutaneous Q4H KJani Gravel MD   1 Units at 09/01/19 0416  . ipratropium-albuterol (DUONEB) 0.5-2.5 (3) MG/3ML nebulizer solution 3 mL  3 mL Nebulization Q6H PRN KJani Gravel MD      . latanoprost (XALATAN) 0.005 % ophthalmic solution 1 drop  1 drop Both Eyes Q2200 KJani Gravel MD   1 drop at 09/01/19 0042  . MEDLINE mouth rinse  15 mL Mouth Rinse q12n4p KJani Gravel MD      . methocarbamol (ROBAXIN) tablet 500 mg  500 mg Oral BID PRN KJani Gravel MD      . pantoprazole (PROTONIX) injection 40 mg  40 mg Intravenous QLoma Sousa MD   40 mg at 09/01/19 0019  . piperacillin-tazobactam (ZOSYN)  IVPB 3.375 g  3.375 g Intravenous Q8H Coffee, Donna Christen, RPH 12.5 mL/hr at 09/01/19 0546 3.375 g at 09/01/19 0546  . senna (SENOKOT) tablet 8.6-17.2 mg  1-2 tablet Oral Daily PRN Jani Gravel, MD      . timolol (TIMOPTIC) 0.5 % ophthalmic solution 1 drop  1 drop Both Eyes Daily Jani Gravel, MD   1 drop at 09/01/19 0907  . vancomycin (VANCOCIN) IVPB 750 mg/150 ml premix  750 mg Intravenous Q12H Coffee, Donna Christen, [facility]   Stopped at 09/01/19 3546   Facility-Administered Medications Ordered in Other Encounters  Medication Dose Route Frequency Provider Last Rate Last Dose  . heparin lock flush 100 unit/mL  500 Units Intravenous Once Sinda Du, MD      . sodium chloride 0.9 % injection 10 mL  10 mL Intravenous PRN Sinda Du, MD        Allergies as of 08/31/2019 - Review Complete 08/31/2019  Allergen Reaction Noted  . Codeine Other (See Comments) 01/10/2011  . Pred forte [prednisolone acetate] Other (See Comments) 12/05/2012    Family History  Problem Relation Age of Onset  . Heart failure Mother 22       results of chf  . Leukemia Father   . Colon cancer Sister   . Heart disease Brother   . Heart disease Brother   . Heart disease Brother   . Heart disease Brother     Social History   Socioeconomic History  . Marital status: Divorced    Spouse name: Not on file  . Number of children:  Not on file  . Years of education: Not on file  . Highest education level: Not on file  Occupational History  . Not on file  Social Needs  . Financial resource strain: Not on file  . Food insecurity    Worry: Not on file    Inability: Not on file  . Transportation needs    Medical: Not on file    Non-medical: Not on file  Tobacco Use  . Smoking status: Current Every Day Smoker    Packs/day: 1.00    Years: 55.00    Pack years: 55.00    Types: Cigarettes  . Smokeless tobacco: Never Used  Substance and Sexual Activity  . Alcohol use: No  . Drug use: No  . Sexual activity: Not on file  Lifestyle  . Physical activity    Days per week: Not on file    Minutes per session: Not on file  . Stress: Not on file  Relationships  . Social Herbalist on phone: Not on file    Gets together: Not on file    Attends religious service: Not on file    Active member of club or organization: Not on file    Attends meetings of clubs or organizations: Not on file    Relationship status: Not on file  . Intimate partner violence    Fear of current or ex partner: Not on file    Emotionally abused: Not on file    Physically abused: Not on file    Forced sexual activity: Not on file  Other Topics Concern  . Not on file  Social History Narrative  . Not on file    Review of Systems: ROS is O/W negative except as mentioned in HPI.  Physical Exam: Vital signs in last 24 hours: Temp:  [97.5 F (36.4 C)-103.5 F (39.7 C)] 97.5 F (36.4 C) (09/07 0745) Pulse Rate:  [75-140]  107 (09/07 0903) Resp:  [8-31] 12 (09/07 0903) BP: (81-142)/(46-81) 101/49 (09/07 0903) SpO2:  [94 %-100 %] 97 % (09/07 0903) Weight:  [112.9 kg-115.7 kg] 112.9 kg (09/07 0424) Last BM Date: 08/31/19 General:  Alert, Well-developed, well-nourished, pleasant and cooperative in NAD.  Appears feverish with chills and sweats. Head:  Normocephalic and atraumatic. Eyes:  Sclera clear, no icterus.  Conjunctiva  pink. Ears:  Normal auditory acuity. Mouth:  No deformity or lesions.   Lungs:  Clear throughout to auscultation.  No wheezes, crackles, or rhonchi.  Heart:  Irregularly irregular. Abdomen:  Soft, non-distended.  BS present.  Diffuse TTP.  Msk:  Symmetrical without gross deformities. Pulses:  Normal pulses noted. Extremities:  Without clubbing or edema. Neurologic:  Alert and oriented x 4;  grossly normal neurologically. Skin:  Intact without significant lesions or rashes. Psych:  Alert and cooperative. Normal mood and affect.  Intake/Output from previous day: 09/06 0701 - 09/07 0700 In: 2160 [I.V.:589.5; IV Piggyback:1570.5] Out: 97 [Urine:650]  Lab Results: Recent Labs    08/31/19 1523  WBC 14.0*  HGB 16.1  HCT 48.2  PLT 175   BMET Recent Labs    08/31/19 1523  NA 137  K 3.8  CL 101  CO2 24  GLUCOSE 142*  BUN 21  CREATININE 1.31*  CALCIUM 9.2   LFT Recent Labs    08/31/19 1523  PROT 7.8  ALBUMIN 4.1  AST 359*  ALT 610*  ALKPHOS 169*  BILITOT 3.5*   PT/INR Recent Labs    08/31/19 1603  LABPROT 14.0  INR 1.1   Studies/Results: Ct Abdomen Pelvis W Contrast  Result Date: 08/31/2019 CLINICAL DATA:  Acute, diffuse abdominal pain with nausea, vomiting and fever. Chills. History of non-Hodgkin's lymphoma. Smoker. EXAM: CT ABDOMEN AND PELVIS WITH CONTRAST TECHNIQUE: Multidetector CT imaging of the abdomen and pelvis was performed using the standard protocol following bolus administration of intravenous contrast. CONTRAST:  145m OMNIPAQUE IOHEXOL 300 MG/ML  SOLN COMPARISON:  07/21/2016 FINDINGS: Lower chest: Tiny amount of pericardial fluid with a maximum thickness of 5 mm, without significant change. Normal sized heart. Mild peribronchial thickening at the lung bases. Hepatobiliary: Interval mild intrahepatic biliary ductal dilatation and dilatation of the common duct. The proximal common duct measures 2.0 cm in maximum diameter. Possible small noncalcified  stones in the distal common duct. Probable small noncalcified gallstones in the gallbladder, measuring up to 5 mm in maximum diameter each. No gallbladder wall thickening or pericholecystic fluid. Pancreas: Unremarkable. No pancreatic ductal dilatation or surrounding inflammatory changes. Spleen: Normal in size without focal abnormality. Adrenals/Urinary Tract: Normal appearing adrenal glands. 4 mm lower pole right renal calculus. Unremarkable left kidney, ureters and urinary bladder. Stomach/Bowel: Large number of colonic diverticula without evidence of diverticulitis. Normal appearing stomach, small bowel and appendix. Vascular/Lymphatic: Atheromatous arterial calcifications without aneurysm. No enlarged lymph nodes. Reproductive: Prostate is unremarkable. Other: Interval repair of the previously demonstrated left inguinal hernia. Small right inguinal hernia containing fat. Musculoskeletal: Left hip prosthesis. 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 Lumbar and lower thoracic spine degenerative changes with a stable 25% L3 status vertebral compression deformity with no acute fracture lines or bony retropulsion. IMPRESSION: 1. Interval mild intrahepatic biliary ductal dilatation and marked dilatation of the proximal common duct with probable small noncalcified stones in the distal common duct. 2. Probable cholelithiasis. 3. 4 mm nonobstructing lower pole right renal calculus. 4. Extensive colonic diverticulosis. 5. Interval repair of the  previously demonstrated left inguinal hernia. 6. Small right inguinal hernia containing fat. 7. Right femoral head avascular necrosis and moderate right hip degenerative changes. Electronically Signed   By: Claudie Revering M.D.   On: 08/31/2019 17:55   Dg Chest Port 1 View  Result Date: 08/31/2019 CLINICAL DATA:  Fever EXAM: PORTABLE CHEST 1 VIEW COMPARISON:  Portable exam 1546 hours compared to 12/17/2012 FINDINGS:  Rotated to the RIGHT. Normal heart size, mediastinal contours, and pulmonary vascularity. Lungs clear. No infiltrate, pleural effusion or pneumothorax. Osseous structures unremarkable. IMPRESSION: No acute abnormalities. Electronically Signed   By: Lavonia Dana M.D.   On: 08/31/2019 15:59   IMPRESSION:  *Sepsis from biliary obstruction:  Elevated LFT's and CBD stones with biliary dilation seen on CT scan. *Atrial fibrillation:  On heparin gtt here.  Will hold/discontinue at 730 AM for a 130 PM procedure on 9/8 with Dr. Rush Landmark.  PLAN: -Continue with antibiotic coverage. -ERCP on 9/8 with Dr. Rush Landmark.  Heparin to be held 6 hours prior to procedure.  Laban Emperor. Issai Werling  09/01/2019, 9:20 AM

## 2019-09-01 NOTE — Progress Notes (Signed)
PROGRESS NOTE    [name]  QVZ:[id number] DOB: 07-04-1946 DOA: 08/31/2019 PCP: Sinda Du, MD  Brief Narrative: 72 y.o. male, w [name], NonHodgkins Lymphoma,  Dm2, Pafib, Tobacco use,  Copd, OSA on Cpap, presents with c/o abdominal pain x 4 days, worse with food, slight n/v, slight fever, chills.  Pt denies cough, cp, palp, sob, diarrhea, brbpr, dysuria.  Pt notes that urine is darker than normal.    In ED,   CT abd/ pelvis Musculoskeletal: Left hip prosthesis. 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 Lumbar and lower thoracic spine degenerative changes with a stable 25% L3 status vertebral compression deformity with no acute fracture lines or bony retropulsion.  IMPRESSION: 1. Interval mild intrahepatic biliary ductal dilatation and marked dilatation of the proximal common duct with probable small noncalcified stones in the distal common duct. 2. Probable cholelithiasis. 3. 4 mm nonobstructing lower pole right renal calculus. 4. Extensive colonic diverticulosis. 5. Interval repair of the previously demonstrated left inguinal hernia. 6. Small right inguinal hernia containing fat. 7. Right femoral head avascular necrosis and moderate right hip degenerative changes.  CXR IMPRESSION: No acute abnormalities.  Wbc 14.0, Hgb 16.1, Plt 175 Na 137, K 3.8 Bun 21, Creatinine 1.31 Alb 4.1, Ast 359, Alt 610, Alk phos 169 . T. Bili 3.5 Lipase 23 Lactic acid 1.9  PTT 30 INR 1.1  Urinalysis SG >1.046 Rbc 11-20 Wbc 0-5   Blood culture x2  covid-19 negative  Pt will be admitted for sepsis (Fever, tachycardia, hypotension),  and Afib with RVR.    Assessment & Plan:   Principal Problem:   Sepsis ([place]) Active Problems:   Diabetes ([place])   TOBACCO ABUSE   Atrial fibrillation with RVR (HCC)   COPD (chronic obstructive pulmonary disease) (HCC)   Obstructive sleep apnea   ARF (acute renal failure)  (HCC)   Abnormal liver function   #1 sepsis present on admission -patient admitted with fever tachycardia and hypotension likely secondary to cholangitis- with right upper quadrant pain with CT findings consistent with biliary ductal dilatation and marked dilatation of the proximal common duct and with probable small noncalcified stones in the distal common duct.  Patient continues to be hypotensive and tachycardic.  He was initially started on vancomycin and Zosyn and Flagyl.  His MRSA PCR is negative.  DC Vanco and Flagyl and continue Zosyn.  Patient continues with chills and has been n.p.o.  He has been just started on clear liquids today.  Continue IV fluids at 75 cc an hour.  Patient seen by GI plan for ERCP tomorrow.  Follow blood cultures.labs pending for today.  #2 paroxysmal A. fib with RVR -new onset most likely secondary to sepsis.  Continue IV Cardizem and IV heparin.  Echo done today results pending.  #3 COPD/obstructive sleep apnea continue CPAP at night and DuoNeb.  #4 type 2 diabetes Metformin on hold continue sliding scale  #5 acute renal failure continue IV hydration labs pending for today.  #6 glaucoma continue eyedrops Timoptic and Xalatan  #7 microscopic hematuria patient has nonobstructive renal stones could be the cause will need outpatient follow-up with urology.    Estimated body mass index is 30.3 kg/m as calculated from the following:   Height as of this encounter: 6' 4" (1.93 m).   Weight as of this encounter: 112.9 kg.  DVT prophylaxis: Heparin  code Status: Full code  family Communication:dw daughter Disposition Plan: Pending clinical improvement Consultants: [facility]   Procedures: Echo 09/01/2019 Antimicrobials: Zosyn  Subjective: Patient resting in bed complaining of chills and right upper quadrant pain no nausea vomiting reported no diarrhea reported   Objective: Vitals:   09/01/19 0600 09/01/19 0657 09/01/19 0745 09/01/19 0903  BP: (!) 104/49    (!) 101/49  Pulse: 75   (!) 107  Resp: 16   12  Temp:  98.2 F (36.8 C) (!) 97.5 F (36.4 C)   TempSrc:  Oral Oral   SpO2: 96%   97%  Weight:      Height:        Intake/Output Summary (Last 24 hours) at 09/01/2019 1023 Last data filed at 09/01/2019 0939 Gross per 24 hour  Intake 2160.02 ml  Output 750 ml  Net 1410.02 ml   Filed Weights   08/31/19 1447 08/31/19 2325 09/01/19 0424  Weight: 115.7 kg 112.9 kg 112.9 kg    Examination:  General exam: Appears calm and comfortable  Respiratory system: Clear to auscultation. Respiratory effort normal. Cardiovascular system: S1 & S2 heard, RRR. No JVD, murmurs, rubs, gallops or clicks. No pedal edema. Gastrointestinal system: Abdomen is distended, soft and tender right upper quadrant no organomegaly or masses felt. Normal bowel sounds heard. Central nervous system: Alert and oriented. No focal neurological deficits. Extremities: Symmetric 5 x 5 power. Skin: No rashes, lesions or ulcers Psychiatry: Judgement and insight appear normal. Mood & affect appropriate.     Data Reviewed: I have personally reviewed following labs and imaging studies  CBC: Recent Labs  Lab 08/31/19 1523  WBC 14.0*  NEUTROABS 11.8*  HGB 16.1  HCT 48.2  MCV 93.2  PLT 704   Basic Metabolic Panel: Recent Labs  Lab 08/31/19 1523  NA 137  K 3.8  CL 101  CO2 24  GLUCOSE 142*  BUN 21  CREATININE 1.31*  CALCIUM 9.2   GFR: Estimated Creatinine Clearance: 70.1 mL/min (A) (by C-G formula based on SCr of 1.31 mg/dL (H)). Liver Function Tests: Recent Labs  Lab 08/31/19 1523  AST 359*  ALT 610*  ALKPHOS 169*  BILITOT 3.5*  PROT 7.8  ALBUMIN 4.1   Recent Labs  Lab 08/31/19 1523  LIPASE 23   No results for input(s): AMMONIA in the last 168 hours. Coagulation Profile: Recent Labs  Lab 08/31/19 1603  INR 1.1   Cardiac Enzymes: No results for input(s): CKTOTAL, CKMB, CKMBINDEX, TROPONINI in the last 168 hours. BNP (last 3 results) No  results for input(s): PROBNP in the last 8760 hours. HbA1C: No results for input(s): HGBA1C in the last 72 hours. CBG: Recent Labs  Lab 08/31/19 2041 08/31/19 2322 09/01/19 0408 09/01/19 0743  GLUCAP 135* 172* 137* 105*   Lipid Profile: No results for input(s): CHOL, HDL, LDLCALC, TRIG, CHOLHDL, LDLDIRECT in the last 72 hours. Thyroid Function Tests: No results for input(s): TSH, T4TOTAL, FREET4, T3FREE, THYROIDAB in the last 72 hours. Anemia Panel: No results for input(s): VITAMINB12, FOLATE, FERRITIN, TIBC, IRON, RETICCTPCT in the last 72 hours. Sepsis Labs: Recent Labs  Lab 08/31/19 1603 08/31/19 1739  LATICACIDVEN 1.9 1.8    Recent Results (from the past 240 hour(s))  Blood Culture (routine x 2)     Status: None (Preliminary result)   Collection Time: 08/31/19  4:03 PM   Specimen: BLOOD RIGHT ARM  Result Value Ref Range Status   Specimen Description   Final    BLOOD RIGHT ARM BOTTLES DRAWN AEROBIC AND ANAEROBIC   Special Requests Blood Culture adequate volume  Final   Culture   Final    NO GROWTH < 24 HOURS Performed at [facility], [address] [place], [place] [address]    Report Status PENDING  Incomplete  Blood Culture (routine x 2)     Status: None (Preliminary result)   Collection Time: 08/31/19  4:13 PM   Specimen: BLOOD RIGHT HAND  Result Value Ref Range Status   Specimen Description   Final    BLOOD RIGHT HAND BOTTLES DRAWN AEROBIC AND ANAEROBIC   Special Requests Blood Culture adequate volume  Final   Culture   Final    NO GROWTH < 24 HOURS Performed at [facility], [address] [place], [place] [address]    Report Status PENDING  Incomplete  SARS Coronavirus 2 [facility] order, Performed in [facility] hospital lab) Nasopharyngeal Urine, Clean Catch     Status: None   Collection Time: 08/31/19  6:42 PM   Specimen: Urine, Clean Catch; Nasopharyngeal  Result Value Ref Range Status   SARS Coronavirus 2 NEGATIVE NEGATIVE Final    Comment:  (NOTE) If result is NEGATIVE SARS-CoV-2 target nucleic acids are NOT DETECTED. The SARS-CoV-2 RNA is generally detectable in upper and lower  respiratory specimens during the acute phase of infection. The lowest  concentration of SARS-CoV-2 viral copies this assay can detect is 250  copies / mL. A negative result does not preclude SARS-CoV-2 infection  and should not be used as the sole basis for treatment or other  patient management decisions.  A negative result may occur with  improper specimen collection / handling, submission of specimen other  than nasopharyngeal swab, presence of viral mutation(s) within the  areas targeted by this assay, and inadequate number of viral copies  (<250 copies / mL). A negative result must be combined with clinical  observations, patient history, and epidemiological information. If result is POSITIVE SARS-CoV-2 target nucleic acids are DETECTED. The SARS-CoV-2 RNA is generally detectable in upper and lower  respiratory specimens dur ing the acute phase of infection.  Positive  results are indicative of active infection with SARS-CoV-2.  Clinical  correlation with patient history and other diagnostic information is  necessary to determine patient infection status.  Positive results do  not rule out bacterial infection or co-infection with other viruses. If result is PRESUMPTIVE POSTIVE SARS-CoV-2 nucleic acids MAY BE PRESENT.   A presumptive positive result was obtained on the submitted specimen  and confirmed on repeat testing.  While 2019 novel coronavirus  (SARS-CoV-2) nucleic acids may be present in the submitted sample  additional confirmatory testing may be necessary for epidemiological  and / or clinical management purposes  to differentiate between  SARS-CoV-2 and other Sarbecovirus currently known to infect humans.  If clinically indicated additional testing with an alternate test  methodology [phone]) is advised. The SARS-CoV-2 RNA is  generally  detectable in upper and lower respiratory sp ecimens during the acute  phase of infection. The expected result is Negative. Fact Sheet for Patients:  StrictlyIdeas.no Fact Sheet for Healthcare Providers: BankingDealers.co.za This test is not yet approved or cleared by the [place] FDA and has been authorized for detection and/or diagnosis of SARS-CoV-2 by FDA under an Emergency Use Authorization (EUA).  This EUA will remain in effect (meaning this test can be used) for the duration of the COVID-19 declaration under Section 564(b)(1) of the Act, 21 U.S.C. section 360bbb-3(b)(1), unless the authorization is terminated or revoked sooner. Performed at [facility], [address]  [place], [place] [address]   MRSA PCR Screening     Status: None   Collection Time: 08/31/19 11:08 PM   Specimen: Nasal Mucosa; Nasopharyngeal  Result Value Ref Range Status   MRSA by PCR NEGATIVE NEGATIVE Final    Comment:        The GeneXpert MRSA Assay (FDA approved for NASAL specimens only), is one component of a comprehensive MRSA colonization surveillance program. It is not intended to diagnose MRSA infection nor to guide or monitor treatment for MRSA infections. Performed at [facility], [place] [address], [place], [place] [address]          Radiology Studies: Ct Abdomen Pelvis W Contrast  Result Date: 08/31/2019 CLINICAL DATA:  Acute, diffuse abdominal pain with nausea, vomiting and fever. Chills. History of non-Hodgkin's lymphoma. Smoker. EXAM: CT ABDOMEN AND PELVIS WITH CONTRAST TECHNIQUE: Multidetector CT imaging of the abdomen and pelvis was performed using the standard protocol following bolus administration of intravenous contrast. CONTRAST:  177m OMNIPAQUE IOHEXOL 300 MG/ML  SOLN COMPARISON:  07/21/2016 FINDINGS: Lower chest: Tiny amount of pericardial fluid with a maximum thickness of 5 mm, without  significant change. Normal sized heart. Mild peribronchial thickening at the lung bases. Hepatobiliary: Interval mild intrahepatic biliary ductal dilatation and dilatation of the common duct. The proximal common duct measures 2.0 cm in maximum diameter. Possible small noncalcified stones in the distal common duct. Probable small noncalcified gallstones in the gallbladder, measuring up to 5 mm in maximum diameter each. No gallbladder wall thickening or pericholecystic fluid. Pancreas: Unremarkable. No pancreatic ductal dilatation or surrounding inflammatory changes. Spleen: Normal in size without focal abnormality. Adrenals/Urinary Tract: Normal appearing adrenal glands. 4 mm lower pole right renal calculus. Unremarkable left kidney, ureters and urinary bladder. Stomach/Bowel: Large number of colonic diverticula without evidence of diverticulitis. Normal appearing stomach, small bowel and appendix. Vascular/Lymphatic: Atheromatous arterial calcifications without aneurysm. No enlarged lymph nodes. Reproductive: Prostate is unremarkable. Other: Interval repair of the previously demonstrated left inguinal hernia. Small right inguinal hernia containing fat. Musculoskeletal: Left hip prosthesis. 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 Lumbar and lower thoracic spine degenerative changes with a stable 25% L3 status vertebral compression deformity with no acute fracture lines or bony retropulsion. IMPRESSION: 1. Interval mild intrahepatic biliary ductal dilatation and marked dilatation of the proximal common duct with probable small noncalcified stones in the distal common duct. 2. Probable cholelithiasis. 3. 4 mm nonobstructing lower pole right renal calculus. 4. Extensive colonic diverticulosis. 5. Interval repair of the previously demonstrated left inguinal hernia. 6. Small right inguinal hernia containing fat. 7. Right femoral head avascular necrosis and  moderate right hip degenerative changes. Electronically Signed   By: SClaudie ReveringM.D.   On: 08/31/2019 17:55   Dg Chest Port 1 View  Result Date: 08/31/2019 CLINICAL DATA:  Fever EXAM: PORTABLE CHEST 1 VIEW COMPARISON:  Portable exam 1546 hours compared to 12/17/2012 FINDINGS: Rotated to the RIGHT. Normal heart size, mediastinal contours, and pulmonary vascularity. Lungs clear. No infiltrate, pleural effusion or pneumothorax. Osseous structures unremarkable. IMPRESSION: No acute abnormalities. Electronically Signed   By: MLavonia DanaM.D.   On: 08/31/2019 15:59        Scheduled Meds: . chlorhexidine  15 mL Mouth Rinse BID  . Chlorhexidine Gluconate Cloth  6 each Topical Daily  . insulin aspart  0-9 Units Subcutaneous Q4H  . latanoprost  1 drop Both Eyes Q2200  . mouth rinse  15 mL  Mouth Rinse q12n4p  . pantoprazole (PROTONIX) IV  40 mg Intravenous QHS  . timolol  1 drop Both Eyes Daily   Continuous Infusions: . sodium chloride 1,000 mL (09/01/19 0911)  . sodium chloride    . diltiazem (CARDIZEM) infusion 5 mg/hr (08/31/19 2218)  . heparin 1,400 Units/hr (08/31/19 2128)  . piperacillin-tazobactam (ZOSYN)  IV 3.375 g (09/01/19 0546)     LOS: 1 day    Georgette Shell, MD [facility]  If 7PM-7AM, please contact night-coverage [email or web address] Password [facility] 09/01/2019, 10:23 AM

## 2019-09-01 NOTE — Progress Notes (Signed)
Pharmacy made aware of heparin drip needing to be stopped at 0130 on 09/02/19 for ERCP at 0730 tomorrow.

## 2019-09-01 NOTE — Progress Notes (Signed)
ANTICOAGULATION CONSULT NOTE - Follow Up Consult  Pharmacy Consult for heparin Indication: atrial fibrillation  Allergies  Allergen Reactions  . Codeine Other (See Comments)    REACTION: Shakes   . Pred Forte [Prednisolone Acetate] Other (See Comments)    Severe burning    Patient Measurements: Height: 6\' 4"  (193 cm) Weight: 248 lb 14.4 oz (112.9 kg) IBW/kg (Calculated) : 86.8 Heparin Dosing Weight: 109 kg  Vital Signs: Temp: 102.4 F (39.1 C) (09/07 1237) Temp Source: Oral (09/07 1237) BP: 113/61 (09/07 1000) Pulse Rate: 122 (09/07 1100)  Labs: Recent Labs    08/31/19 1523 08/31/19 1603 08/31/19 2029 09/01/19 1103  HGB 16.1  --   --   --   HCT 48.2  --   --   --   PLT 175  --   --   --   APTT  --  30  --   --   LABPROT  --  14.0  --   --   INR  --  1.1  --   --   HEPARINUNFRC  --   --   --  0.31  CREATININE 1.31*  --   --  1.43*  TROPONINIHS  --   --  11  --     Estimated Creatinine Clearance: 64.2 mL/min (A) (by C-G formula based on SCr of 1.43 mg/dL (H)).   Medical History: Past Medical History:  Diagnosis Date  . Atrial fibrillation ([place])   . Avascular necrosis of bones of both hips ([place])   . 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 Cancer ([place])   . Colonic polyp   . Complication of anesthesia    hard to wake up, "felt like died and revived me"  . COPD (chronic obstructive pulmonary disease) ([facility])    mild PFT abnormalities; normal ABG  . Degenerative disc disease, lumbar    HNP of the LS spine  . Diabetes mellitus    type 2  . GERD (gastroesophageal reflux disease)   . Glaucoma   . H/O hiatal hernia   . Hx MRSA infection 2010   sith splenic abcess  . Non Hodgkin's lymphoma ([place]) 06/2009   chemotherapy until 11/2010  . Obesity   . Obstructive sleep apnea    treated with CPAP  . Orthostatic hypotension    lightheadness; no definate loss of consciousness  . Pedal edema   . PONV (postoperative nausea and vomiting)   . Splenic abscess 07/2009   . Tobacco abuse    45 pack years    Assessment: Patient admitted with abdominal pain - found to have CBD stones and biliary obstruction. Heparin drip initiated on admission for atrial fibrillation - patient was not taking anticoagulants PTA.   Baseline labs:  Hgb 16.1 Plt 175 INR 1.1 APTT 30 seconds  Today, 09/01/19  HL = 0.31 is therapeutic on heparin infusion of 1400 units/hr  Confirmed with RN that heparin infusing at correct rate and no signs/symptoms of bleeding  Hgb 13.7 - decreased but WNL  Plt 132 - slightly low  Goal of Therapy:  Heparin level 0.3-0.7 units/ml Monitor platelets by anticoagulation protocol: Yes   Plan:   Continue heparin infusion at current rate of 1400 units/hr  Check confirmatory HL in 8 hours.   CBC and HL daily  Heparin to be discontinued on 9/8 @ 0730 prior to ERCP (see GI note)  Monitor for signs/symptoms of bleeding or thrombosis  Lenis Noon, PharmD 09/01/2019,12:44 PM

## 2019-09-01 NOTE — H&P (View-Only) (Signed)
Referring Provider:  [facility] Primary Care Physician:  Sinda Du, MD Primary Gastroenterologist:  Marena Chancy, had EUS with Dr. Ardis Hughs in 2010  Reason for Consultation:  Biliary sepsis/obstruction from CBD stones  HPI: [name] is a 73 y.o. male w PMH for Gerd, NonHodgkins Lymphoma, Dm2, Paroxysmal atrial fib, Tobacco use, Copd, OSA on Cpap, who presented to [facility] with complaints of feverish feeling, shaking chills and abdominal pain for 5 days prior to presentation.  He says that he always has abdominal pain all over but pain got worse on Tuesday and was radiating to his back.  Could not get rid of feverish feeling or shaking chills so went to the ED.  Upon evaluation he was found to have the following:  Wbc 14.0, Hgb 16.1, Plt 175 Na 137, K 3.8 Bun 21, Creatinine 1.31 Alb 4.1, Ast 359, Alt 610, Alk phos 169 . T. Bili 3.5 Lipase 23 Lactic acid 1.9  PTT 30 INR 1.1  Urinalysis SG >1.046 Rbc 11-20 Wbc 0-5   Blood culture x 2 collected  covid-19 negative  CT scan of the abdomen and pelvis with contrast was performed and showed the following:  IMPRESSION: 1. Interval mild intrahepatic biliary ductal dilatation and marked dilatation of the proximal common duct with probable small noncalcified stones in the distal common duct. 2. Probable cholelithiasis. 3. 4 mm nonobstructing lower pole right renal calculus. 4. Extensive colonic diverticulosis. 5. Interval repair of the previously demonstrated left inguinal hernia. 6. Small right inguinal hernia containing fat. 7. Right femoral head avascular necrosis and moderate right hip degenerative changes.  Temp 103.5 overnight.  Labs from the AM still pending.  Received Flagyl and Rocephin.  Now on Zosyn.  Is on heparin gtt for atrial fibrillation.   Past Medical History:  Diagnosis Date  . Atrial fibrillation ([place])   . Avascular necrosis of bones of both hips ([place])   . 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  Cancer ([place])   . Colonic polyp   . Complication of anesthesia    hard to wake up, "felt like died and revived me"  . COPD (chronic obstructive pulmonary disease) ([facility])    mild PFT abnormalities; normal ABG  . Degenerative disc disease, lumbar    HNP of the LS spine  . Diabetes mellitus    type 2  . GERD (gastroesophageal reflux disease)   . Glaucoma   . H/O hiatal hernia   . Hx MRSA infection 2010   sith splenic abcess  . Non Hodgkin's lymphoma ([place]) 06/2009   chemotherapy until 11/2010  . Obesity   . Obstructive sleep apnea    treated with CPAP  . Orthostatic hypotension    lightheadness; no definate loss of consciousness  . Pedal edema   . PONV (postoperative nausea and vomiting)   . Splenic abscess 07/2009  . Tobacco abuse    45 pack years    Past Surgical History:  Procedure Laterality Date  . ABSCESS DRAINAGE  2010   Splenic  . ANAL FISSURE 46  73years old  . CATARACT EXTRACTION     Right with lens implant  . COLONOSCOPY W/ POLYPECTOMY  2008   with snare polypectomy  . EYE SURGERY     lens implant  . INGUINAL HERNIA REPAIR Left 03/09/2017   Procedure: OPEN REPAIR LEFT INGUINAL HERNIA;  Surgeon: Arta Bruce Kinsinger, MD;  Location: [facility];  Service: General;  Laterality: Left;  . INSERTION OF MESH Left 03/09/2017  Procedure: INSERTION OF MESH;  Surgeon: Arta Bruce Kinsinger, MD;  Location: [facility];  Service: General;  Laterality: Left;  . KNEE ARTHROSCOPY     right  . LYMPH NODE BIOPSY  2010   done x 2 for NHL diagnosis  . TONSILLECTOMY    . TOTAL HIP ARTHROPLASTY  12/13/2012   Procedure: TOTAL HIP ARTHROPLASTY ANTERIOR APPROACH;  Surgeon: Mcarthur Rossetti, MD;  Location: [facility];  Service: Orthopedics;  Laterality: Left;  Left Total Hip Arthroplasty, Anterior Approach (C-Arm)    Prior to Admission medications   Medication Sig Start Date End Date Taking? Authorizing Provider  aspirin EC 325 MG tablet Take 1 tablet (325 mg total) by mouth daily. 12/17/12   Yes Mcarthur Rossetti, MD  atorvastatin (LIPITOR) 20 MG tablet Take 20 mg by mouth daily. 05/08/19  Yes [provider]  diltiazem (CARDIZEM CD) 240 MG 24 hr capsule Take 240 mg by mouth daily with breakfast. 12/04/16  Yes [provider]  ipratropium-albuterol (DUONEB) 0.5-2.5 (3) MG/3ML SOLN Take 3 mLs by nebulization every 6 (six) hours as needed (for wheezing/shortness of breath).   Yes [provider]  latanoprost (XALATAN) 0.005 % ophthalmic solution Place 1 drop into both eyes daily at 10 pm. 12/04/16  Yes [provider]  metFORMIN (GLUCOPHAGE) 500 MG tablet Take 500 mg by mouth 2 (two) times daily.   Yes [provider]  methocarbamol (ROBAXIN) 500 MG tablet Take 1 tablet (500 mg total) by mouth 2 (two) times daily. 08/10/18  Yes Jacqlyn Larsen, PA-C  senna (SENOKOT) 8.6 MG tablet Take 1-2 tablets by mouth daily as needed for constipation. Constipation   Yes [provider]  timolol (TIMOPTIC) 0.5 % ophthalmic solution Place 1 drop into both eyes daily. 01/12/17  Yes [provider]    Current Facility-Administered Medications  Medication Dose Route Frequency Provider Last Rate Last Dose  . 0.9 %  sodium chloride infusion   Intravenous PRN Georgette Shell, MD 10 mL/hr at 09/01/19 0911 1,000 mL at 09/01/19 0911  . chlorhexidine (PERIDEX) 0.12 % solution 15 mL  15 mL Mouth Rinse BID Jani Gravel, MD   15 mL at 09/01/19 0905  . Chlorhexidine Gluconate Cloth 2 % PADS 6 each  6 each Topical Daily Jani Gravel, MD      . diltiazem (CARDIZEM) 100 mg in dextrose 5 % 100 mL (1 mg/mL) infusion  5-15 mg/hr Intravenous Titrated Jani Gravel, MD 5 mL/hr at 08/31/19 2218 5 mg/hr at 08/31/19 2218  . heparin ADULT infusion 100 units/mL (25000 units/221m sodium chloride 0.45%)  1,400 Units/hr Intravenous Continuous Coffee, GDonna Christen [facility]14 mL/hr at 08/31/19 2128 1,400 Units/hr at 08/31/19 2128  . insulin aspart (novoLOG) injection 0-9 Units   0-9 Units Subcutaneous Q4H KJani Gravel MD   1 Units at 09/01/19 0416  . ipratropium-albuterol (DUONEB) 0.5-2.5 (3) MG/3ML nebulizer solution 3 mL  3 mL Nebulization Q6H PRN KJani Gravel MD      . latanoprost (XALATAN) 0.005 % ophthalmic solution 1 drop  1 drop Both Eyes Q2200 KJani Gravel MD   1 drop at 09/01/19 0042  . MEDLINE mouth rinse  15 mL Mouth Rinse q12n4p KJani Gravel MD      . methocarbamol (ROBAXIN) tablet 500 mg  500 mg Oral BID PRN KJani Gravel MD      . pantoprazole (PROTONIX) injection 40 mg  40 mg Intravenous QLoma Sousa MD   40 mg at 09/01/19 0019  . piperacillin-tazobactam (ZOSYN)  IVPB 3.375 g  3.375 g Intravenous Q8H Coffee, Donna Christen, RPH 12.5 mL/hr at 09/01/19 0546 3.375 g at 09/01/19 0546  . senna (SENOKOT) tablet 8.6-17.2 mg  1-2 tablet Oral Daily PRN Jani Gravel, MD      . timolol (TIMOPTIC) 0.5 % ophthalmic solution 1 drop  1 drop Both Eyes Daily Jani Gravel, MD   1 drop at 09/01/19 0907  . vancomycin (VANCOCIN) IVPB 750 mg/150 ml premix  750 mg Intravenous Q12H Coffee, Donna Christen, [facility]   Stopped at 09/01/19 4628   Facility-Administered Medications Ordered in Other Encounters  Medication Dose Route Frequency Provider Last Rate Last Dose  . heparin lock flush 100 unit/mL  500 Units Intravenous Once Sinda Du, MD      . sodium chloride 0.9 % injection 10 mL  10 mL Intravenous PRN Sinda Du, MD        Allergies as of 08/31/2019 - Review Complete 08/31/2019  Allergen Reaction Noted  . Codeine Other (See Comments) 01/10/2011  . Pred forte [prednisolone acetate] Other (See Comments) 12/05/2012    Family History  Problem Relation Age of Onset  . Heart failure Mother 57       results of chf  . Leukemia Father   . Colon cancer Sister   . Heart disease Brother   . Heart disease Brother   . Heart disease Brother   . Heart disease Brother     Social History   Socioeconomic History  . Marital status: Divorced    Spouse name: Not on file  . Number of children:  Not on file  . Years of education: Not on file  . Highest education level: Not on file  Occupational History  . Not on file  Social Needs  . Financial resource strain: Not on file  . Food insecurity    Worry: Not on file    Inability: Not on file  . Transportation needs    Medical: Not on file    Non-medical: Not on file  Tobacco Use  . Smoking status: Current Every Day Smoker    Packs/day: 1.00    Years: 55.00    Pack years: 55.00    Types: Cigarettes  . Smokeless tobacco: Never Used  Substance and Sexual Activity  . Alcohol use: No  . Drug use: No  . Sexual activity: Not on file  Lifestyle  . Physical activity    Days per week: Not on file    Minutes per session: Not on file  . Stress: Not on file  Relationships  . Social Herbalist on phone: Not on file    Gets together: Not on file    Attends religious service: Not on file    Active member of club or organization: Not on file    Attends meetings of clubs or organizations: Not on file    Relationship status: Not on file  . Intimate partner violence    Fear of current or ex partner: Not on file    Emotionally abused: Not on file    Physically abused: Not on file    Forced sexual activity: Not on file  Other Topics Concern  . Not on file  Social History Narrative  . Not on file    Review of Systems: ROS is O/W negative except as mentioned in HPI.  Physical Exam: Vital signs in last 24 hours: Temp:  [97.5 F (36.4 C)-103.5 F (39.7 C)] 97.5 F (36.4 C) (09/07 0745) Pulse Rate:  [75-140]  107 (09/07 0903) Resp:  [8-31] 12 (09/07 0903) BP: (81-142)/(46-81) 101/49 (09/07 0903) SpO2:  [94 %-100 %] 97 % (09/07 0903) Weight:  [112.9 kg-115.7 kg] 112.9 kg (09/07 0424) Last BM Date: 08/31/19 General:  Alert, Well-developed, well-nourished, pleasant and cooperative in NAD.  Appears feverish with chills and sweats. Head:  Normocephalic and atraumatic. Eyes:  Sclera clear, no icterus.  Conjunctiva  pink. Ears:  Normal auditory acuity. Mouth:  No deformity or lesions.   Lungs:  Clear throughout to auscultation.  No wheezes, crackles, or rhonchi.  Heart:  Irregularly irregular. Abdomen:  Soft, non-distended.  BS present.  Diffuse TTP.  Msk:  Symmetrical without gross deformities. Pulses:  Normal pulses noted. Extremities:  Without clubbing or edema. Neurologic:  Alert and oriented x 4;  grossly normal neurologically. Skin:  Intact without significant lesions or rashes. Psych:  Alert and cooperative. Normal mood and affect.  Intake/Output from previous day: 09/06 0701 - 09/07 0700 In: 2160 [I.V.:589.5; IV Piggyback:1570.5] Out: 71 [Urine:650]  Lab Results: Recent Labs    08/31/19 1523  WBC 14.0*  HGB 16.1  HCT 48.2  PLT 175   BMET Recent Labs    08/31/19 1523  NA 137  K 3.8  CL 101  CO2 24  GLUCOSE 142*  BUN 21  CREATININE 1.31*  CALCIUM 9.2   LFT Recent Labs    08/31/19 1523  PROT 7.8  ALBUMIN 4.1  AST 359*  ALT 610*  ALKPHOS 169*  BILITOT 3.5*   PT/INR Recent Labs    08/31/19 1603  LABPROT 14.0  INR 1.1   Studies/Results: Ct Abdomen Pelvis W Contrast  Result Date: 08/31/2019 CLINICAL DATA:  Acute, diffuse abdominal pain with nausea, vomiting and fever. Chills. History of non-Hodgkin's lymphoma. Smoker. EXAM: CT ABDOMEN AND PELVIS WITH CONTRAST TECHNIQUE: Multidetector CT imaging of the abdomen and pelvis was performed using the standard protocol following bolus administration of intravenous contrast. CONTRAST:  168m OMNIPAQUE IOHEXOL 300 MG/ML  SOLN COMPARISON:  07/21/2016 FINDINGS: Lower chest: Tiny amount of pericardial fluid with a maximum thickness of 5 mm, without significant change. Normal sized heart. Mild peribronchial thickening at the lung bases. Hepatobiliary: Interval mild intrahepatic biliary ductal dilatation and dilatation of the common duct. The proximal common duct measures 2.0 cm in maximum diameter. Possible small noncalcified  stones in the distal common duct. Probable small noncalcified gallstones in the gallbladder, measuring up to 5 mm in maximum diameter each. No gallbladder wall thickening or pericholecystic fluid. Pancreas: Unremarkable. No pancreatic ductal dilatation or surrounding inflammatory changes. Spleen: Normal in size without focal abnormality. Adrenals/Urinary Tract: Normal appearing adrenal glands. 4 mm lower pole right renal calculus. Unremarkable left kidney, ureters and urinary bladder. Stomach/Bowel: Large number of colonic diverticula without evidence of diverticulitis. Normal appearing stomach, small bowel and appendix. Vascular/Lymphatic: Atheromatous arterial calcifications without aneurysm. No enlarged lymph nodes. Reproductive: Prostate is unremarkable. Other: Interval repair of the previously demonstrated left inguinal hernia. Small right inguinal hernia containing fat. Musculoskeletal: Left hip prosthesis. 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 Lumbar and lower thoracic spine degenerative changes with a stable 25% L3 status vertebral compression deformity with no acute fracture lines or bony retropulsion. IMPRESSION: 1. Interval mild intrahepatic biliary ductal dilatation and marked dilatation of the proximal common duct with probable small noncalcified stones in the distal common duct. 2. Probable cholelithiasis. 3. 4 mm nonobstructing lower pole right renal calculus. 4. Extensive colonic diverticulosis. 5. Interval repair of the  previously demonstrated left inguinal hernia. 6. Small right inguinal hernia containing fat. 7. Right femoral head avascular necrosis and moderate right hip degenerative changes. Electronically Signed   By: Claudie Revering M.D.   On: 08/31/2019 17:55   Dg Chest Port 1 View  Result Date: 08/31/2019 CLINICAL DATA:  Fever EXAM: PORTABLE CHEST 1 VIEW COMPARISON:  Portable exam 1546 hours compared to 12/17/2012 FINDINGS:  Rotated to the RIGHT. Normal heart size, mediastinal contours, and pulmonary vascularity. Lungs clear. No infiltrate, pleural effusion or pneumothorax. Osseous structures unremarkable. IMPRESSION: No acute abnormalities. Electronically Signed   By: Lavonia Dana M.D.   On: 08/31/2019 15:59   IMPRESSION:  *Sepsis from biliary obstruction:  Elevated LFT's and CBD stones with biliary dilation seen on CT scan. *Atrial fibrillation:  On heparin gtt here.  Will hold/discontinue at 730 AM for a 130 PM procedure on 9/8 with Dr. Rush Landmark.  PLAN: -Continue with antibiotic coverage. -ERCP on 9/8 with Dr. Rush Landmark.  Heparin to be held 6 hours prior to procedure.  Laban Emperor. Anndee Connett  09/01/2019, 9:20 AM

## 2019-09-01 NOTE — Progress Notes (Signed)
  Echocardiogram 2D Echocardiogram has been performed.  Bobbye Charleston 09/01/2019, 8:35 AM

## 2019-09-01 NOTE — Progress Notes (Signed)
Brief Pharmacy Note:  51 y/oM on IV heparin infusion for atrial fibrillation.    PM heparin level = 0.24 units/mL, slightly subtherapeutic CBC: Hgb 13.7, Pltc decreased to 132K No bleeding or infusion issues noted per nursing   Plan: Increase heparin infusion to 1600 units/hr Heparin infusion to be stopped on 09/02/2019 at 0130 per GI for ERCP at 0730.  F/u post-procedure for resumption of anticoagulation.   Lindell Spar, PharmD, BCPS Clinical Pharmacist  09/01/2019 8:38 PM

## 2019-09-01 NOTE — Progress Notes (Signed)
Patient and patient's daughter, Lenna Sciara, notified of changes in ERCP time. Both patient and Melissa verbalized understanding.

## 2019-09-01 NOTE — Progress Notes (Signed)
ERCP has been moved to 0730 on 09/02/19. Notified pt's nurse to stop heparin drip at 0130 on 09/02/19 for ERCP per Dr. Rush Landmark.

## 2019-09-01 NOTE — Progress Notes (Signed)
Pharmacy Antibiotic Note  [name] is a 73 y.o. male admitted on 08/31/2019 with sepsis.  Pharmacy has been consulted for piperacillin/tazobactam dosing.  Pt has sepsis likely secondary to cholangitis. Planning for ERCP on 9/8. Antibiotics de-escalated from vancomycin, piperacillin/tazobactam, and metronidazole to piperacillin/tazobactam monotherapy.  Today, 09/01/19  WBC 14 - slightly elevated  SCR 1.4, CrCl 64 mL/min  Tmax 103.5 F  Plan:  Piperacillin/tazobactam 3.375 g IV q8h EI  Pharmacy to sign off and follow dosing peripherally. Please re-consult if needed.   Height: 6\' 4"  (193 cm) Weight: 248 lb 14.4 oz (112.9 kg) IBW/kg (Calculated) : 86.8  Temp (24hrs), Avg:99.1 F (37.3 C), Min:97.5 F (36.4 C), Max:103.5 F (39.7 C)  Recent Labs  Lab 08/31/19 1523 08/31/19 1603 08/31/19 1739 09/01/19 1103  WBC 14.0*  --   --   --   CREATININE 1.31*  --   --  1.43*  LATICACIDVEN  --  1.9 1.8  --     Estimated Creatinine Clearance: 64.2 mL/min (A) (by C-G formula based on SCr of 1.43 mg/dL (H)).    Allergies  Allergen Reactions  . Codeine Other (See Comments)    REACTION: Shakes   . Pred Forte [Prednisolone Acetate] Other (See Comments)    Severe burning    Antimicrobials this admission: 9/6 vancomycin >> 9/7 9/6 zosyn >>  9/6 metronidazole x1  Microbiology results: 9/6 BCx: ngtd 9/6 UCx: sent  9/6 MRSA PCR: Negative 9/6 SARS-2: Negative  Thank you for allowing pharmacy to be a part of this patient's care.  Lenis Noon, PharmD 09/01/2019 12:35 PM

## 2019-09-01 NOTE — Progress Notes (Signed)
Initial Nutrition Assessment  RD working remotely.  DOCUMENTATION CODES:   Obesity unspecified  INTERVENTION:  Provide Boost Breeze po TID with meals, each supplement provides 250 kcal and 9 grams of protein. Will monitor CBGs and adjust oral nutrition supplement regimen as needed.  Once diet advanced recommend changing to Ensure Max Protein po BID, each supplement provides 150 kcal and 30 grams of protein.  NUTRITION DIAGNOSIS:   Inadequate oral intake related to acute illness(abdominal pain, nausea, vomiting, possible common bile duct obstruction) as evidenced by (per chart, clear liquid diet).  GOAL:   Patient will meet greater than or equal to 90% of their needs  MONITOR:   PO intake, Supplement acceptance, Diet advancement, Labs, Weight trends, I & O's  REASON FOR ASSESSMENT:   Malnutrition Screening Tool    ASSESSMENT:   73 year old male with PMHx of A-fib, COPD, orthostatic hypotension, DM, glaucoma, OSA, GERD, lumbar DDD, hx hiatal hernia, Bowen's disease, hx non Hodgkin's lymphoma admitted with sepsis, abnormal liver function, possible common bile duct obstruction, A-fib with RVR, acute renal failure.   Attempted to call patient over the phone but he was unable to answer. Per chart he had abdominal pain for 4 days PTA that worsened with food, and also had slight N/V. He is currently on clear liquid diet and will be made NPO after midnight. Pending GI evaluation.  Limited weight history in chart to trend. He was 115.7 kg on 08/10/2018. He is currently 112.9 kg (248.9 lbs). He has lost 2.8 kg (2.4% body weight) over approximately one year, which is not significant for time frame.  Medications reviewed and include: Novolog 0-9 units Q4hrs, pantoprazole, NS at 75 mL/hr, Zosyn.  Labs reviewed: CBG 105-172, Creatinine 1.31. Last HgbA1c was 5.7 on 03/08/2017.  NUTRITION - FOCUSED PHYSICAL EXAM:  Unable to complete at this time.  Diet Order:   Diet Order            Diet  NPO time specified  Diet effective midnight        Diet clear liquid Room service appropriate? Yes; Fluid consistency: Thin  Diet effective now             EDUCATION NEEDS:   No education needs have been identified at this time  Skin:  Skin Assessment: Reviewed RN Assessment  Last BM:  08/31/2019 per chart  Height:   Ht Readings from Last 1 Encounters:  08/31/19 6\' 4"  (1.93 m)   Weight:   Wt Readings from Last 1 Encounters:  09/01/19 112.9 kg   Ideal Body Weight:  91.8 kg  BMI:  Body mass index is 30.3 kg/m.  Estimated Nutritional Needs:   Kcal:  2200-2400  Protein:  115-125 grams  Fluid:  2.2-2.4 L/day  Willey Blade, MS, RD, LDN Office: [phone] Pager: [phone] After Hours/Weekend Pager: [phone]

## 2019-09-01 NOTE — Anesthesia Preprocedure Evaluation (Addendum)
Anesthesia Evaluation  Patient identified by MRN, date of birth, ID band Patient awake    Reviewed: Allergy & Precautions, NPO status , Patient's Chart, lab work & pertinent test results  History of Anesthesia Complications (+) PONV and history of anesthetic complications  Airway Mallampati: III  TM Distance: >3 FB Neck ROM: Full    Dental no notable dental hx. (+) Dental Advisory Given   Pulmonary sleep apnea and Continuous Positive Airway Pressure Ventilation , COPD, Current Smoker,    Pulmonary exam normal        Cardiovascular Normal cardiovascular exam+ dysrhythmias Atrial Fibrillation      Neuro/Psych negative neurological ROS     GI/Hepatic Neg liver ROS, hiatal hernia, GERD  ,  Endo/Other  negative endocrine ROSdiabetes  Renal/GU Renal InsufficiencyRenal disease     Musculoskeletal negative musculoskeletal ROS (+)   Abdominal   Peds  Hematology negative hematology ROS (+)   Anesthesia Other Findings Day of surgery medications reviewed with the patient.  Reproductive/Obstetrics                            Anesthesia Physical Anesthesia Plan  ASA: III  Anesthesia Plan: General   Post-op Pain Management:    Induction: Intravenous  PONV Risk Score and Plan: 2 and Ondansetron, Scopolamine patch - Pre-op and Midazolam  Airway Management Planned: Oral ETT  Additional Equipment:   Intra-op Plan:   Post-operative Plan: Extubation in OR  Informed Consent: I have reviewed the patients History and Physical, chart, labs and discussed the procedure including the risks, benefits and alternatives for the proposed anesthesia with the patient or authorized representative who has indicated his/her understanding and acceptance.     Dental advisory given  Plan Discussed with: CRNA and Anesthesiologist  Anesthesia Plan Comments:        Anesthesia Quick Evaluation

## 2019-09-02 ENCOUNTER — Encounter (HOSPITAL_COMMUNITY)
Admission: EM | Disposition: A | Discharge status: Home / Self Care | Payer: Self-pay | Source: Home / Self Care | Attending: Internal Medicine

## 2019-09-02 ENCOUNTER — Inpatient Hospital Stay (HOSPITAL_COMMUNITY): Payer: Medicare Other

## 2019-09-02 ENCOUNTER — Inpatient Hospital Stay (HOSPITAL_COMMUNITY): Payer: Medicare Other | Admitting: Anesthesiology

## 2019-09-02 ENCOUNTER — Encounter (HOSPITAL_COMMUNITY): Payer: Self-pay | Admitting: Anesthesiology

## 2019-09-02 DIAGNOSIS — K3189 Other diseases of stomach and duodenum: Secondary | ICD-10-CM

## 2019-09-02 DIAGNOSIS — K8051 Calculus of bile duct without cholangitis or cholecystitis with obstruction: Secondary | ICD-10-CM

## 2019-09-02 DIAGNOSIS — K838 Other specified diseases of biliary tract: Secondary | ICD-10-CM

## 2019-09-02 HISTORY — PX: BILIARY DILATION: SHX6850

## 2019-09-02 HISTORY — PX: REMOVAL OF STONES: SHX5545

## 2019-09-02 HISTORY — PX: BILIARY STENT PLACEMENT: SHX5538

## 2019-09-02 HISTORY — PX: ERCP: SHX5425

## 2019-09-02 HISTORY — PX: SPHINCTEROTOMY: SHX5544

## 2019-09-02 HISTORY — PX: BIOPSY: SHX5522

## 2019-09-02 LAB — URINE CULTURE

## 2019-09-02 LAB — COMPREHENSIVE METABOLIC PANEL
ALT: 292 U/L — ABNORMAL HIGH (ref 0–44)
AST: 110 U/L — ABNORMAL HIGH (ref 15–41)
Albumin: 2.8 g/dL — ABNORMAL LOW (ref 3.5–5.0)
Alkaline Phosphatase: 139 U/L — ABNORMAL HIGH (ref 38–126)
Anion gap: 7 (ref 5–15)
BUN: 17 mg/dL (ref 8–23)
CO2: 23 mmol/L (ref 22–32)
Calcium: 7.6 mg/dL — ABNORMAL LOW (ref 8.9–10.3)
Chloride: 105 mmol/L (ref 98–111)
Creatinine, Ser: 1 mg/dL (ref 0.61–1.24)
GFR calc Af Amer: >60 mL/min (ref >60)
GFR calc non Af Amer: >60 mL/min (ref >60)
Glucose, Bld: 117 mg/dL — ABNORMAL HIGH (ref 70–99)
Potassium: 3.2 mmol/L — ABNORMAL LOW (ref 3.5–5.1)
Sodium: 135 mmol/L (ref 135–145)
Total Bilirubin: 4.1 mg/dL — ABNORMAL HIGH (ref 0.3–1.2)
Total Protein: 5.5 g/dL — ABNORMAL LOW (ref 6.5–8.1)

## 2019-09-02 LAB — GLUCOSE, CAPILLARY
Glucose-Capillary: 113 mg/dL — ABNORMAL HIGH (ref 70–99)
Glucose-Capillary: 100 mg/dL — ABNORMAL HIGH (ref 70–99)
Glucose-Capillary: 143 mg/dL — ABNORMAL HIGH (ref 70–99)
Glucose-Capillary: 155 mg/dL — ABNORMAL HIGH (ref 70–99)
Glucose-Capillary: 135 mg/dL — ABNORMAL HIGH (ref 70–99)
Glucose-Capillary: 74 mg/dL (ref 70–99)

## 2019-09-02 LAB — SURGICAL PCR SCREEN
MRSA, PCR: NEGATIVE
Staphylococcus aureus: NEGATIVE

## 2019-09-02 SURGERY — ERCP, WITH INTERVENTION IF INDICATED
Anesthesia: General

## 2019-09-02 MED ORDER — INDOMETHACIN 50 MG RE SUPP
RECTAL | Status: DC | PRN
  Administered 2019-09-02: 100 mg via RECTAL

## 2019-09-02 MED ORDER — LACTATED RINGERS IV SOLN
INTRAVENOUS | Status: AC | PRN
  Administered 2019-09-02: 1000 mL via INTRAVENOUS

## 2019-09-02 MED ORDER — POTASSIUM CHLORIDE 10 MEQ/100ML IV SOLN
10.0000 meq | INTRAVENOUS | Status: AC
  Administered 2019-09-02 (×3): 10 meq via INTRAVENOUS
  Filled 2019-09-02 (×3): qty 100

## 2019-09-02 MED ORDER — PROPOFOL 10 MG/ML IV BOLUS
INTRAVENOUS | Status: AC
  Filled 2019-09-02: qty 40

## 2019-09-02 MED ORDER — ONDANSETRON HCL 4 MG/2ML IJ SOLN
INTRAMUSCULAR | Status: DC | PRN
  Administered 2019-09-02: 4 mg via INTRAVENOUS

## 2019-09-02 MED ORDER — ROCURONIUM BROMIDE 10 MG/ML (PF) SYRINGE
PREFILLED_SYRINGE | INTRAVENOUS | Status: DC | PRN
  Administered 2019-09-02: 40 mg via INTRAVENOUS

## 2019-09-02 MED ORDER — DILTIAZEM HCL ER COATED BEADS 120 MG PO CP24
240.0000 mg | ORAL_CAPSULE | Freq: Every day | ORAL | Status: DC
  Administered 2019-09-03 – 2019-09-04 (×2): 240 mg via ORAL
  Filled 2019-09-02 (×2): qty 2

## 2019-09-02 MED ORDER — SCOPOLAMINE 1 MG/3DAYS TD PT72
MEDICATED_PATCH | TRANSDERMAL | Status: AC
  Filled 2019-09-02: qty 1

## 2019-09-02 MED ORDER — GLUCAGON HCL RDNA (DIAGNOSTIC) 1 MG IJ SOLR
INTRAMUSCULAR | Status: DC | PRN
  Administered 2019-09-02 (×4): 0.25 mg via INTRAVENOUS

## 2019-09-02 MED ORDER — SCOPOLAMINE 1 MG/3DAYS TD PT72
1.0000 | MEDICATED_PATCH | TRANSDERMAL | Status: DC
  Administered 2019-09-02: 1.5 mg via TRANSDERMAL

## 2019-09-02 MED ORDER — MORPHINE SULFATE (PF) 2 MG/ML IV SOLN
1.0000 mg | Freq: Once | INTRAVENOUS | Status: AC
  Administered 2019-09-02: 1 mg via INTRAVENOUS
  Filled 2019-09-02: qty 1

## 2019-09-02 MED ORDER — HEPARIN (PORCINE) 25000 UT/250ML-% IV SOLN
1700.0000 [IU]/h | INTRAVENOUS | Status: DC
  Administered 2019-09-02: 1600 [IU]/h via INTRAVENOUS
  Administered 2019-09-03: 1700 [IU]/h via INTRAVENOUS
  Filled 2019-09-02 (×2): qty 250

## 2019-09-02 MED ORDER — PROPOFOL 10 MG/ML IV BOLUS
INTRAVENOUS | Status: DC | PRN
  Administered 2019-09-02: 150 mg via INTRAVENOUS
  Administered 2019-09-02: 50 mg via INTRAVENOUS

## 2019-09-02 MED ORDER — INDOMETHACIN 50 MG RE SUPP: 100.0000 mg | Freq: Once | RECTAL | Status: DC

## 2019-09-02 MED ORDER — SODIUM CHLORIDE 0.9 % IV SOLN: INTRAVENOUS | Status: DC

## 2019-09-02 MED ORDER — GLUCAGON HCL RDNA (DIAGNOSTIC) 1 MG IJ SOLR
INTRAMUSCULAR | Status: AC
  Filled 2019-09-02: qty 1

## 2019-09-02 MED ORDER — LIDOCAINE 2% (20 MG/ML) 5 ML SYRINGE
INTRAMUSCULAR | Status: DC | PRN
  Administered 2019-09-02: 60 mg via INTRAVENOUS

## 2019-09-02 MED ORDER — INDOMETHACIN 50 MG RE SUPP
RECTAL | Status: AC
  Filled 2019-09-02: qty 2

## 2019-09-02 MED ORDER — SUGAMMADEX SODIUM 200 MG/2ML IV SOLN
INTRAVENOUS | Status: DC | PRN
  Administered 2019-09-02: 200 mg via INTRAVENOUS

## 2019-09-02 MED ORDER — PROPOFOL 10 MG/ML IV BOLUS
INTRAVENOUS | Status: AC
  Filled 2019-09-02: qty 20

## 2019-09-02 MED ORDER — LACTATED RINGERS IV SOLN: INTRAVENOUS | Status: DC

## 2019-09-02 MED ORDER — SUCCINYLCHOLINE CHLORIDE 20 MG/ML IJ SOLN
INTRAMUSCULAR | Status: DC | PRN
  Administered 2019-09-02: 120 mg via INTRAVENOUS

## 2019-09-02 MED ORDER — SODIUM CHLORIDE 0.9 % IV SOLN
INTRAVENOUS | Status: DC | PRN
  Administered 2019-09-02: 75 mL

## 2019-09-02 NOTE — Op Note (Signed)
[facility] Patient Name: [name] Procedure Date: 09/02/2019 MRN: [id number] Attending MD: Justice Britain , MD Date of Birth: [birth year] CSN: [id number] Age: 73 Admit Type: Inpatient Procedure:                ERCP Indications:              Generalized abdominal pain, Abnormal abdominal CT,                            Biliary dilation on Computed Tomogram Scan, Bile                            duct stone on Computed Tomogram Scan Providers:                Justice Britain, MD, Carlyn Reichert, RN, Elspeth Cho Tech., Technician, Lazaro Arms, Technician Referring MD:             Carlota Raspberry. Havery Moros, MD, Dr. Zigmund Daniel ([facility]),                            Thornton Park MD, MD Medicines:                General Anesthesia, Indomethacin 100 mg PR,                            Glucagon 0.75 mg IV, Zosyn given earlier this AM                            and completed prior to procedure Complications:            No immediate complications. Estimated Blood Loss:     Estimated blood loss was minimal. Procedure:                Pre-Anesthesia Assessment:                           - Prior to the procedure, a History and Physical                            was performed, and patient medications and                            allergies were reviewed. The patient's tolerance of                            previous anesthesia was also reviewed. The risks                            and benefits of the procedure and the sedation                            options and risks were discussed with the patient.  All questions were answered, and informed consent                            was obtained. Prior Anticoagulants: The patient has                            taken heparin, last dose was day of procedure. ASA                            Grade Assessment: III - A patient with severe                            systemic disease. After  reviewing the risks and                            benefits, the patient was deemed in satisfactory                            condition to undergo the procedure.                           After obtaining informed consent, the scope was                            passed under direct vision. Throughout the                            procedure, the patient's blood pressure, pulse, and                            oxygen saturations were monitored continuously. The                            TJF-Q180V ([id number] Olympus duodenoscope was                            introduced through the mouth, and used to inject                            contrast into and used to inject contrast into the                            bile duct. The ERCP was accomplished without                            difficulty. The patient tolerated the procedure. Scope In: Scope Out: Findings:      The scout film was normal.      The scope was passed under direct vision through the upper GI tract. No       gross lesions were noted in the entire examined stomach. Patchy       moderately erythematous mucosa without active bleeding and with no       stigmata of bleeding was found in the duodenal bulb and in the first  portion of the duodenum. Biopsies were taken in the cardia, in the       gastric body, at the incisura and in the gastric antrum through the ERCP       scope with the cold forceps for histology and H. pylori evaluation. The       major papilla was prominent - query inflammatory from stone disease       within duct.      A short 0.035 inch Soft Jagwire was passed into the biliary tree. The       Autotome sphincterotome was passed over the guidewire and the bile duct       was then deeply cannulated. Contrast was injected. I personally       interpreted the bile duct images. Ductal flow of contrast was adequate.       Image quality was adequate. Contrast extended to the hepatic ducts.       Opacification of  the entire biliary tree except for the cystic duct and       gallbladder was successful. The lower third of the main duct contained       filling defects thought to be stones and sludge but size was within       normal limits. The middle third of the main bile duct, upper third of       the main bile duct and left and right hepatic ducts and all intrahepatic       branches were severely dilated, with stone debris causing an       obstruction. The largest diameter was 18 mm. An 8 mm biliary       sphincterotomy was made with a monofilament Autotome sphincterotome       using ERBE electrocautery. There was no post-sphincterotomy bleeding.       Dilation of the distal common bile duct with an 08-02-09 mm x 5.5 cm CRE       balloon (to a maximum balloon size of 10 mm) dilator was successful as a       sphincteroplasty for 4 minutes was performed to aid in attempts at stone       removal. To discover objects, the biliary tree was swept with a       retrieval balloon starting at the bifurcation. A few stones were       removed. No stones remained. Pus was swept from the duct. Sludge was       swept from the duct. After more than 10 minutes of waiting, flow was       noted to be slow/inadequate. Due to the persistence of sludge and pus       coming out of the duct, decision was made to ensure adquate drainage. An       occlusion cholangiogram was performed that showed no further significant       biliary pathology. One 10 Fr by 9 cm plastic biliary stent with a single       external flap and a single internal flap was placed into the common bile       duct. Bile and pus flowed through the stent. The stent was in good       position.      A pancreatogram was not performed.      Biopsies were taken of the prominent ampulla through the ERCP scope with       the cold forceps for histology.      The duodenoscope was  withdrawn from the patient. Impression:               - No gross lesions in the stomach.  Biopsies                            obtained for HP.                           - Erythematous duodenopathy.                           - The major papilla appeared to be prominent.                            Biopsied.                           - Filling defects consistent with stones and sludge                            were seen on the cholangiogram in the distal duct                            (normal sized however).                           - The upper third of the main bile duct, middle                            third of the main bile duct and left and right                            hepatic ducts and all intrahepatic branches were                            severely dilated, with a stone causing an                            obstruction.                           - Choledocholithiasis and cholangitis and biliary                            sludge was found. Complete removal was accomplished                            by biliary sphincterotomy/biliary sphincteroplasty                            and balloon sweeping.                           - Drainage was very slow from the common hepatic  duct region, with amount of cholangitis still                            draining, decision made to place a temporary stent                            to ensure adequate drainage.                           - Unable to visualize the cystic duct filling even                            upon occlusion cholangiogram. Moderate Sedation:      Not Applicable - Patient had care per Anesthesia. Recommendation:           - The patient will be observed post-procedure,                            until all discharge criteria are met.                           - Return patient to hospital ward for ongoing care.                           - Check liver enzymes (AST, ALT, alkaline                            phosphatase, bilirubin) in the morning.                           - Observe  patient's clinical course.                           - Watch for pancreatitis, bleeding, perforation,                            and cholangitis.                           - Agree with surgical referral for consideration of                            cholecystectomy.                           - Patient will be at risk of bleeding                            post-sphincterotomy/sphincteroplasty no matter                            what. If heparin to be restarted then would plan to                            restart 10 hours from today's procedure (700PM) and  proceed without bolus and gently increase as                            necessary for heparin goal. If NOAC is necessary                            then would plan to start no sooner than 48-72 hours                            from now.                           - Surgical referral for cholecystectomy should be                            made.                           - Await path results.                           - Repeat ERCP in 6-8 weeks to remove stent.                           - Continue PPI (would ideally be on 40 BID for the                            next few weeks).                           - The findings and recommendations were discussed                            with the patient.                           - The findings and recommendations were discussed                            with the referring physician. Procedure Code(s):        --- Professional ---                           [phone], Endoscopic retrograde                            cholangiopancreatography (ERCP); with placement of                            endoscopic stent into biliary or pancreatic duct,                            including pre- and post-dilation and guide wire                            passage, when performed, including  sphincterotomy,                            when performed, each stent                            [id number], Endoscopic retrograde                            cholangiopancreatography (ERCP); with removal of                            calculi/debris from biliary/pancreatic duct(s) Diagnosis Code(s):        --- Professional ---                           K31.89, Other diseases of stomach and duodenum                           K83.8, Other specified diseases of biliary tract                           K80.51, Calculus of bile duct without cholangitis                            or cholecystitis with obstruction                           R10.84, Generalized abdominal pain                           R93.5, Abnormal findings on diagnostic imaging of                            other abdominal regions, including retroperitoneum                           R93.2, Abnormal findings on diagnostic imaging of                            liver and biliary tract CPT copyright 2019 [employer]. All rights reserved. The codes documented in this report are preliminary and upon coder review may  be revised to meet current compliance requirements. Justice Britain, MD 09/02/2019 9:21:30 AM Number of Addenda: 0

## 2019-09-02 NOTE — Progress Notes (Signed)
PROGRESS NOTE    [name]  UJW:[id number] DOB: 02-25-1946 DOA: 08/31/2019 PCP: Ryan Du, MD    Brief Narrative:73 y.o.male,w [name], NonHodgkins Lymphoma, Dm2, Pafib, Tobacco use, Copd, OSA on Cpap, presents with c/o abdominal pain x 4 days, worse with food, slight n/v, slight fever, chills. Pt denies cough, cp, palp, sob, diarrhea, brbpr, dysuria. Pt notes that urine is darker than normal.   In ED,   CT abd/ pelvis Musculoskeletal: Left hip prosthesis. 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 Lumbar and lower thoracic spine degenerative changes with a stable 25% L3 status vertebral compression deformity with no acute fracture lines or bony retropulsion.  IMPRESSION: 1. Interval mild intrahepatic biliary ductal dilatation and marked dilatation of the proximal common duct with probable small noncalcified stones in the distal common duct. 2. Probable cholelithiasis. 3. 4 mm nonobstructing lower pole right renal calculus. 4. Extensive colonic diverticulosis. 5. Interval repair of the previously demonstrated left inguinal hernia. 6. Small right inguinal hernia containing fat. 7. Right femoral head avascular necrosis and moderate right hip degenerative changes.  CXR IMPRESSION: No acute abnormalities.  Wbc 14.0, Hgb 16.1, Plt 175 Na 137, K 3.8 Bun 21, Creatinine 1.31 Alb 4.1, Ast 359, Alt 610, Alk phos 169 . T. Bili 3.5 Lipase 23 Lactic acid 1.9  PTT 30 INR 1.1  Urinalysis SG >1.046 Rbc 11-20 Wbc 0-5   Blood culture x2  covid-19 negative  Pt will be admitted for sepsis (Fever, tachycardia  Assessment & Plan:   Principal Problem:   Sepsis ([name]) Active Problems:   Diabetes ([name])   TOBACCO ABUSE   Atrial fibrillation with RVR ([facility])   COPD (chronic obstructive pulmonary disease) ([facility])   Obstructive sleep apnea   ARF (acute renal failure) ([facility])   Abnormal liver function    Common biliary duct obstruction   Acute cholangitis   #1 sepsis present on admission -patient admitted with fever tachycardia and hypotension likely secondary to cholangitis- with right upper quadrant pain with CT findings consistent with biliary ductal dilatation and marked dilatation of the proximal common duct and with probable small noncalcified stones in the distal common duct.    He is status post ERCP 09/02/2019.  All intrahepatic ducts were severely dilated with a stone causing obstruction, choledocholithiasis and cholangitis and biliary sludge was found, complete removal was accomplished by biliary sphincterotomy and sphincteroplasty and balloon sweeping.  I have consulted general surgery for possible cholecystectomy.  LFTs trending down.  #2 paroxysmal A. fib with RVR -patient and patient's family reports that he has a history of atrial fibrillation.  He is not sure why he is not on a blood thinner.  However he was on Cardizem for rate control at home.  Will restart heparin tonight after 7 PM.  He will likely need a p.o. agent upon discharge.  Will wait for surgery recommendations before I start him on a doac.restart p.o. Cardizem DC IV Cardizem.  Potassium 3.2 replete. #3 COPD/obstructive sleep apnea continue CPAP at night and DuoNeb.  #4 type 2 diabetes Metformin on hold continue sliding scale  #5 acute renal failure resolved with IV hydration creatinine 1.0 today continue IV hydration labs pending for today.  #6 glaucoma continue eyedrops Timoptic and Xalatan  #7 microscopic hematuria patient has nonobstructive renal stones could be the cause will need outpatient follow-up with urology. #8 hypokalemia replete potassium.   DVT prophylaxis: Heparin  code Status: Full code  family Communication:dw daughter Disposition  Plan: Pending clinical improvement Consultants: [facility] , General surgery  Procedures: Echo 09/01/2019 Antimicrobials: Zosyn    Nutrition Problem:  Inadequate oral intake Etiology: acute illness(abdominal pain, nausea, vomiting, possible common bile duct obstruction)     Signs/Symptoms: (per chart, clear liquid diet)    Interventions: Refer to RD note for recommendations  Estimated body mass index is 30.91 kg/m as calculated from the following:   Height as of this encounter: '6\' 4"'  (1.93 m).   Weight as of this encounter: 115.2 kg.      Subjective: Appears much more worse comfortable talking awake alert family by the bedside pain controlled  Objective: Vitals:   09/02/19 1000 09/02/19 1002 09/02/19 1100 09/02/19 1200  BP: (!) 143/68  103/70 119/70  Pulse: 97 81 79 74  Resp: 15 15 (!) 23 10  Temp:  98 F (36.7 C)  97.9 F (36.6 C)  TempSrc:  Oral  Oral  SpO2: 97% 96% 99% 98%  Weight:      Height:        Intake/Output Summary (Last 24 hours) at 09/02/2019 1424 Last data filed at 09/02/2019 1000 Gross per 24 hour  Intake 1909.59 ml  Output 950 ml  Net 959.59 ml   Filed Weights   09/01/19 0424 09/02/19 0539 09/02/19 0702  Weight: 112.9 kg 115.2 kg 115.2 kg    Examination:  General exam: Appears calm and comfortable  Respiratory system: Clear to auscultation. Respiratory effort normal. Cardiovascular system: S1 & S2 heard, RRR. No JVD, murmurs, rubs, gallops or clicks. No pedal edema. Gastrointestinal system: Abdomen is nondistended, soft and nontender. No organomegaly or masses felt. Normal bowel sounds heard. Central nervous system: Alert and oriented. No focal neurological deficits. Extremities: Symmetric 5 x 5 power. Skin: No rashes, lesions or ulcers Psychiatry: Judgement and insight appear normal. Mood & affect appropriate.     Data Reviewed: I have personally reviewed following labs and imaging studies  CBC: Recent Labs  Lab 08/31/19 1523 09/01/19 1102  WBC 14.0* 14.2*  NEUTROABS 11.8*  --   HGB 16.1 13.7  HCT 48.2 42.8  MCV 93.2 94.7  PLT 175 400*   Basic Metabolic Panel: Recent Labs   Lab 08/31/19 1523 09/01/19 1103 09/02/19 0210  NA 137 139 135  K 3.8 3.7 3.2*  CL 101 105 105  CO2 '24 24 23  ' GLUCOSE 142* 112* 117*  BUN '21 23 17  ' CREATININE 1.31* 1.43* 1.00  CALCIUM 9.2 8.4* 7.6*   GFR: Estimated Creatinine Clearance: 92.7 mL/min (by C-G formula based on SCr of 1 mg/dL). Liver Function Tests: Recent Labs  Lab 08/31/19 1523 09/01/19 1103 09/02/19 0210  AST 359* 208* 110*  ALT 610* 446* 292*  ALKPHOS 169* 165* 139*  BILITOT 3.5* 4.2* 4.1*  PROT 7.8 6.6 5.5*  ALBUMIN 4.1 3.6 2.8*   Recent Labs  Lab 08/31/19 1523  LIPASE 23   No results for input(s): AMMONIA in the last 168 hours. Coagulation Profile: Recent Labs  Lab 08/31/19 1603  INR 1.1   Cardiac Enzymes: No results for input(s): CKTOTAL, CKMB, CKMBINDEX, TROPONINI in the last 168 hours. BNP (last 3 results) No results for input(s): PROBNP in the last 8760 hours. HbA1C: No results for input(s): HGBA1C in the last 72 hours. CBG: Recent Labs  Lab 09/01/19 2016 09/01/19 2344 09/02/19 0347 09/02/19 0704 09/02/19 1237  GLUCAP 136* 111* 113* 100* 143*   Lipid Profile: No results for input(s): CHOL, HDL, LDLCALC, TRIG, CHOLHDL, LDLDIRECT in the last 72  hours. Thyroid Function Tests: No results for input(s): TSH, T4TOTAL, FREET4, T3FREE, THYROIDAB in the last 72 hours. Anemia Panel: No results for input(s): VITAMINB12, FOLATE, FERRITIN, TIBC, IRON, RETICCTPCT in the last 72 hours. Sepsis Labs: Recent Labs  Lab 08/31/19 1603 08/31/19 1739  LATICACIDVEN 1.9 1.8    Recent Results (from the past 240 hour(s))  Blood Culture (routine x 2)     Status: None (Preliminary result)   Collection Time: 08/31/19  4:03 PM   Specimen: BLOOD RIGHT ARM  Result Value Ref Range Status   Specimen Description   Final    BLOOD RIGHT ARM BOTTLES DRAWN AEROBIC AND ANAEROBIC   Special Requests Blood Culture adequate volume  Final   Culture   Final    NO GROWTH 2 DAYS Performed at [facility],  [address], [place], [place] [address]    Report Status PENDING  Incomplete  Blood Culture (routine x 2)     Status: None (Preliminary result)   Collection Time: 08/31/19  4:13 PM   Specimen: BLOOD RIGHT HAND  Result Value Ref Range Status   Specimen Description   Final    BLOOD RIGHT HAND BOTTLES DRAWN AEROBIC AND ANAEROBIC   Special Requests Blood Culture adequate volume  Final   Culture   Final    NO GROWTH 2 DAYS Performed at [facility], [address], [place], [place] [address]    Report Status PENDING  Incomplete  Urine culture     Status: Abnormal   Collection Time: 08/31/19  6:42 PM   Specimen: In/Out Cath Urine  Result Value Ref Range Status   Specimen Description   Final    IN/OUT CATH URINE Performed at [facility], [address] [place], [place] [address]    Special Requests   Final    NONE Performed at [facility], [address] [place], [place] [address]    Culture MULTIPLE SPECIES PRESENT, [place] (A)  Final   Report Status 09/02/2019 FINAL  Final  SARS Coronavirus 2 [facility] order, Performed in [facility] hospital lab) Nasopharyngeal Urine, Clean Catch     Status: None   Collection Time: 08/31/19  6:42 PM   Specimen: Urine, Clean Catch; Nasopharyngeal  Result Value Ref Range Status   SARS Coronavirus 2 NEGATIVE NEGATIVE Final    Comment: (NOTE) If result is NEGATIVE SARS-CoV-2 target nucleic acids are NOT DETECTED. The SARS-CoV-2 RNA is generally detectable in upper and lower  respiratory specimens during the acute phase of infection. The lowest  concentration of SARS-CoV-2 viral copies this assay can detect is 250  copies / mL. A negative result does not preclude SARS-CoV-2 infection  and should not be used as the sole basis for treatment or other  patient management decisions.  A negative result may occur with  improper specimen collection / handling, submission of specimen other  than nasopharyngeal swab, presence of viral mutation(s)  within the  areas targeted by this assay, and inadequate number of viral copies  (<250 copies / mL). A negative result must be combined with clinical  observations, patient history, and epidemiological information. If result is POSITIVE SARS-CoV-2 target nucleic acids are DETECTED. The SARS-CoV-2 RNA is generally detectable in upper and lower  respiratory specimens dur ing the acute phase of infection.  Positive  results are indicative of active infection with SARS-CoV-2.  Clinical  correlation with patient history and other diagnostic information is  necessary to determine patient infection status.  Positive results do  not  rule out bacterial infection or co-infection with other viruses. If result is PRESUMPTIVE POSTIVE SARS-CoV-2 nucleic acids MAY BE PRESENT.   A presumptive positive result was obtained on the submitted specimen  and confirmed on repeat testing.  While 2019 novel coronavirus  (SARS-CoV-2) nucleic acids may be present in the submitted sample  additional confirmatory testing may be necessary for epidemiological  and / or clinical management purposes  to differentiate between  SARS-CoV-2 and other Sarbecovirus currently known to infect humans.  If clinically indicated additional testing with an alternate test  methodology [phone]) is advised. The SARS-CoV-2 RNA is generally  detectable in upper and lower respiratory sp ecimens during the acute  phase of infection. The expected result is Negative. Fact Sheet for Patients:  StrictlyIdeas.no Fact Sheet for Healthcare Providers: BankingDealers.co.za This test is not yet approved or cleared by the [place] FDA and has been authorized for detection and/or diagnosis of SARS-CoV-2 by FDA under an Emergency Use Authorization (EUA).  This EUA will remain in effect (meaning this test can be used) for the duration of the COVID-19 declaration under Section 564(b)(1) of the Act,  21 U.S.C. section 360bbb-3(b)(1), unless the authorization is terminated or revoked sooner. Performed at [facility], [address] [place], [place] [address]   MRSA PCR Screening     Status: None   Collection Time: 08/31/19 11:08 PM   Specimen: Nasal Mucosa; Nasopharyngeal  Result Value Ref Range Status   MRSA by PCR NEGATIVE NEGATIVE Final    Comment:        The GeneXpert MRSA Assay (FDA approved for NASAL specimens only), is one component of a comprehensive MRSA colonization surveillance program. It is not intended to diagnose MRSA infection nor to guide or monitor treatment for MRSA infections. Performed at [facility], [place] [address], [place], [place] [address]   Surgical pcr screen     Status: None   Collection Time: 09/02/19  1:36 AM   Specimen: Nasal Mucosa; Nasal Swab  Result Value Ref Range Status   MRSA, PCR NEGATIVE NEGATIVE Final   Staphylococcus aureus NEGATIVE NEGATIVE Final    Comment: (NOTE) The Xpert SA Assay (FDA approved for NASAL specimens in patients 68 years of age and older), is one component of a comprehensive surveillance program. It is not intended to diagnose infection nor to guide or monitor treatment. Performed at [facility], [place] [address] [place],  [address]          Radiology Studies: Ct Abdomen Pelvis W Contrast  Result Date: 08/31/2019 CLINICAL DATA:  Acute, diffuse abdominal pain with nausea, vomiting and fever. Chills. History of non-Hodgkin's lymphoma. Smoker. EXAM: CT ABDOMEN AND PELVIS WITH CONTRAST TECHNIQUE: Multidetector CT imaging of the abdomen and pelvis was performed using the standard protocol following bolus administration of intravenous contrast. CONTRAST:  139m OMNIPAQUE IOHEXOL 300 MG/ML  SOLN COMPARISON:  07/21/2016 FINDINGS: Lower chest: Tiny amount of pericardial fluid with a maximum thickness of 5 mm, without significant change. Normal sized heart. Mild peribronchial  thickening at the lung bases. Hepatobiliary: Interval mild intrahepatic biliary ductal dilatation and dilatation of the common duct. The proximal common duct measures 2.0 cm in maximum diameter. Possible small noncalcified stones in the distal common duct. Probable small noncalcified gallstones in the gallbladder, measuring up to 5 mm in maximum diameter each. No gallbladder wall thickening or pericholecystic fluid. Pancreas: Unremarkable. No pancreatic ductal dilatation or surrounding inflammatory changes. Spleen: Normal in size without focal abnormality. Adrenals/Urinary Tract:  Normal appearing adrenal glands. 4 mm lower pole right renal calculus. Unremarkable left kidney, ureters and urinary bladder. Stomach/Bowel: Large number of colonic diverticula without evidence of diverticulitis. Normal appearing stomach, small bowel and appendix. Vascular/Lymphatic: Atheromatous arterial calcifications without aneurysm. No enlarged lymph nodes. Reproductive: Prostate is unremarkable. Other: Interval repair of the previously demonstrated left inguinal hernia. Small right inguinal hernia containing fat. Musculoskeletal: Left hip prosthesis. 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 Lumbar and lower thoracic spine degenerative changes with a stable 25% L3 status vertebral compression deformity with no acute fracture lines or bony retropulsion. IMPRESSION: 1. Interval mild intrahepatic biliary ductal dilatation and marked dilatation of the proximal common duct with probable small noncalcified stones in the distal common duct. 2. Probable cholelithiasis. 3. 4 mm nonobstructing lower pole right renal calculus. 4. Extensive colonic diverticulosis. 5. Interval repair of the previously demonstrated left inguinal hernia. 6. Small right inguinal hernia containing fat. 7. Right femoral head avascular necrosis and moderate right hip degenerative changes. Electronically Signed    By: Claudie Revering M.D.   On: 08/31/2019 17:55   Dg Chest Port 1 View  Result Date: 08/31/2019 CLINICAL DATA:  Fever EXAM: PORTABLE CHEST 1 VIEW COMPARISON:  Portable exam 1546 hours compared to 12/17/2012 FINDINGS: Rotated to the RIGHT. Normal heart size, mediastinal contours, and pulmonary vascularity. Lungs clear. No infiltrate, pleural effusion or pneumothorax. Osseous structures unremarkable. IMPRESSION: No acute abnormalities. Electronically Signed   By: Lavonia Dana M.D.   On: 08/31/2019 15:59   Dg Ercp  Result Date: 09/02/2019 CLINICAL DATA:  73 year old male with biliary ductal dilatation and probable choledocholithiasis. EXAM: ERCP TECHNIQUE: Multiple spot images obtained with the fluoroscopic device and submitted for interpretation post-procedure. FLUOROSCOPY TIME:  Fluoroscopy Time:  3 minutes 57 seconds Radiation Exposure Index (if provided by the fluoroscopic device): 129 mGy COMPARISON:  None. FINDINGS: These images were submitted for radiologic interpretation only. Please see the procedural report for the amount of contrast and the fluoroscopy time utilized. A total of 17 saved images and 3 cine clips are submitted for review. The images demonstrate a flexible endoscope in the descending duodenum with deep wire cannulation of the intrahepatic ducts followed by sphincterotomy, balloon dilation and balloon sweeping of the common bile duct. On the initial cholangiogram images, there is biliary ductal dilatation and mobile filling defects in the distal common duct most consistent with choledocholithiasis. On the final image, a plastic biliary stent has been placed. IMPRESSION: 1. Choledocholithiasis. 2. ERCP with sphincterotomy, balloon sweep of the common duct and placement of plastic biliary stent. Electronically Signed   By: Jacqulynn Cadet M.D.   On: 09/02/2019 10:15        Scheduled Meds: . Chlorhexidine Gluconate Cloth  6 each Topical Daily  . [START ON 09/03/2019] diltiazem  240 mg  Oral Q breakfast  . feeding supplement  1 Container Oral TID WC  . indomethacin  100 mg Rectal Once  . insulin aspart  0-9 Units Subcutaneous Q4H  . latanoprost  1 drop Both Eyes Q2200  . mouth rinse  15 mL Mouth Rinse BID  . pantoprazole (PROTONIX) IV  40 mg Intravenous QHS  . timolol  1 drop Both Eyes Daily   Continuous Infusions: . sodium chloride 250 mL (09/02/19 1304)  . sodium chloride 75 mL/hr at 09/01/19 1812  . heparin    . lactated ringers    . piperacillin-tazobactam (ZOSYN)  IV 3.375 g (09/02/19 1306)  LOS: 2 days     Georgette Shell, MD [facility]  If 7PM-7AM, please contact night-coverage [email or web address] Password [facility] 09/02/2019, 2:24 PM

## 2019-09-02 NOTE — Anesthesia Postprocedure Evaluation (Signed)
Anesthesia Post Note  Patient: [name]  Procedure(s) Performed: ENDOSCOPIC RETROGRADE CHOLANGIOPANCREATOGRAPHY (ERCP) (N/A ) SPHINCTEROTOMY BILIARY DILATION REMOVAL OF STONES BIOPSY BILIARY STENT PLACEMENT (N/A )     Patient location during evaluation: Endoscopy Anesthesia Type: General Level of consciousness: awake and alert Pain management: pain level controlled Vital Signs Assessment: post-procedure vital signs reviewed and stable Respiratory status: spontaneous breathing, nonlabored ventilation, respiratory function stable and patient connected to nasal cannula oxygen Cardiovascular status: blood pressure returned to baseline and stable Postop Assessment: no apparent nausea or vomiting Anesthetic complications: no    Last Vitals:  Vitals:   09/02/19 0930 09/02/19 1002  BP: (!) 100/46   Pulse: 99 81  Resp: 15 15  Temp:  36.7 C  SpO2: 95% 96%    Last Pain:  Vitals:   09/02/19 1002  TempSrc: Oral  PainSc:                  Jakelin Taussig DANIEL

## 2019-09-02 NOTE — Consult Note (Addendum)
[facility] Surgery Consult Note  [name] Jan 18, 1946  [id number].    Requesting MD: Dr. Rodena Piety Chief Complaint/Reason for Consult: Cholangitis   HPI: [name] is a 73 y.o. male with a remote history of Non-Hodgkin's lymphoma as well as a history of  DM2, paroxysmal A. fib, tobacco abuse, COPD on 2 L as needed at home and OSA on CPAP who presented to [facility] on 9/6 for abdominal pain.  Patient reports that before presenting to the ED at [facility], he had approximately 4 days of generalized abdominal pain that  Wrapped around his back and abdomen like a band. The pain was constant, severe, worse with movement and palpation.  He notes associated fever, chills, nausea and bloating.  He notes he has been having right upper quadrant abdominal pain, bloating and nausea almost daily after eating for the last several months leading up to this.  In the ED he was noted to be febrile at 103, WBC 14, AST 359, ALT 610, alk phos 169, T bili 3.5.  CT abdomen pelvis showed mild intra-hepatic biliary duct dilation and marked dilatation of the proximal common bile duct with probable small noncalcified stones in the common bile duct as well as cholelithiasis.  He was transferred to [facility] and admitted by medicine.  GI consulted and performed an ERCP on 9/8 by Dr. Rush Landmark that revealed choledocholithiasis as well as cholangitis.  This was treated with biliary sphincterotomy/sphincteroplasty, balloon sweeping and placement of a temporary plastic stent.  General surgery was asked to see. Surgical history included left inguinal hernia repair with mesh by Dr. Kieth Brightly on 3/18.  His heparin is currently on hold.  Family is at bedside, daughter Lenna Sciara.  ROS: Review of Systems  Constitutional: Positive for chills and fever.  Respiratory: Negative for cough and shortness of breath.   Cardiovascular: Negative for chest pain.  Gastrointestinal: Positive for abdominal pain, heartburn and  nausea. Negative for constipation, diarrhea and vomiting.  Genitourinary: Negative for dysuria.  All other systems reviewed and are negative. All systems reviewed and otherwise negative except for as above  Family History  Problem Relation Age of Onset  . Heart failure Mother 81       results of chf  . Leukemia Father   . Colon cancer Sister   . Heart disease Brother   . Heart disease Brother   . Heart disease Brother   . Heart disease Brother     Past Medical History:  Diagnosis Date  . Atrial fibrillation ([place])   . Avascular necrosis of bones of both hips ([place])   . 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 Cancer ([place])   . Colonic polyp   . Complication of anesthesia    hard to wake up, "felt like died and revived me"  . COPD (chronic obstructive pulmonary disease) (H[facility])    mild PFT abnormalities; normal ABG  . Degenerative disc disease, lumbar    HNP of the LS spine  . Diabetes mellitus    type 2  . GERD (gastroesophageal reflux disease)   . Glaucoma   . H/O hiatal hernia   . Hx MRSA infection 2010   sith splenic abcess  . Non Hodgkin's lymphoma ([place]) 06/2009   chemotherapy until 11/2010  . Obesity   . Obstructive sleep apnea    treated with CPAP  . Orthostatic hypotension    lightheadness; no definate loss of consciousness  . Pedal edema   . PONV (  postoperative nausea and vomiting)   . Splenic abscess 07/2009  . Tobacco abuse    45 pack years    Past Surgical History:  Procedure Laterality Date  . ABSCESS DRAINAGE  2010   Splenic  . ANAL FISSURE 62  73years old  . CATARACT EXTRACTION     Right with lens implant  . COLONOSCOPY W/ POLYPECTOMY  2008   with snare polypectomy  . EYE SURGERY     lens implant  . INGUINAL HERNIA REPAIR Left 03/09/2017   Procedure: OPEN REPAIR LEFT INGUINAL HERNIA;  Surgeon: Arta Bruce Kinsinger, MD;  Location: [facility];  Service: General;  Laterality: Left;  . INSERTION OF MESH Left 03/09/2017   Procedure: INSERTION OF MESH;   Surgeon: Arta Bruce Kinsinger, MD;  Location: [facility];  Service: General;  Laterality: Left;  . KNEE ARTHROSCOPY     right  . LYMPH NODE BIOPSY  2010   done x 2 for NHL diagnosis  . TONSILLECTOMY    . TOTAL HIP ARTHROPLASTY  12/13/2012   Procedure: TOTAL HIP ARTHROPLASTY ANTERIOR APPROACH;  Surgeon: Mcarthur Rossetti, MD;  Location: [facility];  Service: Orthopedics;  Laterality: Left;  Left Total Hip Arthroplasty, Anterior Approach (C-Arm)    Social History:  reports that he has been smoking cigarettes. He has a 55.00 pack-year smoking history. He has never used smokeless tobacco. He reports that he does not drink alcohol or use drugs.  Reports a 45-pack-year history.  1 PPD No alcohol use.  No illicit drug use He lives at home by himself. He is retired  Allergies:  Allergies  Allergen Reactions  . Codeine Other (See Comments)    REACTION: Shakes   . Pred Forte [Prednisolone Acetate] Other (See Comments)    Severe burning    Medications Prior to Admission  Medication Sig Dispense Refill  . aspirin EC 325 MG tablet Take 1 tablet (325 mg total) by mouth daily. 30 tablet 0  . atorvastatin (LIPITOR) 20 MG tablet Take 20 mg by mouth daily.    Marland Kitchen diltiazem (CARDIZEM CD) 240 MG 24 hr capsule Take 240 mg by mouth daily with breakfast.    . ipratropium-albuterol (DUONEB) 0.5-2.5 (3) MG/3ML SOLN Take 3 mLs by nebulization every 6 (six) hours as needed (for wheezing/shortness of breath).    . latanoprost (XALATAN) 0.005 % ophthalmic solution Place 1 drop into both eyes daily at 10 pm.    . metFORMIN (GLUCOPHAGE) 500 MG tablet Take 500 mg by mouth 2 (two) times daily.    . methocarbamol (ROBAXIN) 500 MG tablet Take 1 tablet (500 mg total) by mouth 2 (two) times daily. 20 tablet 0  . senna (SENOKOT) 8.6 MG tablet Take 1-2 tablets by mouth daily as needed for constipation. Constipation    . timolol (TIMOPTIC) 0.5 % ophthalmic solution Place 1 drop into both eyes daily.      Prior to  Admission medications   Medication Sig Start Date End Date Taking? Authorizing Provider  aspirin EC 325 MG tablet Take 1 tablet (325 mg total) by mouth daily. 12/17/12  Yes Mcarthur Rossetti, MD  atorvastatin (LIPITOR) 20 MG tablet Take 20 mg by mouth daily. 05/08/19  Yes [provider]  diltiazem (CARDIZEM CD) 240 MG 24 hr capsule Take 240 mg by mouth daily with breakfast. 12/04/16  Yes [provider]  ipratropium-albuterol (DUONEB) 0.5-2.5 (3) MG/3ML SOLN Take 3 mLs by nebulization every 6 (six) hours as needed (for wheezing/shortness of breath).   Yes  [provider]  latanoprost (XALATAN) 0.005 % ophthalmic solution Place 1 drop into both eyes daily at 10 pm. 12/04/16  Yes [provider]  metFORMIN (GLUCOPHAGE) 500 MG tablet Take 500 mg by mouth 2 (two) times daily.   Yes [provider]  methocarbamol (ROBAXIN) 500 MG tablet Take 1 tablet (500 mg total) by mouth 2 (two) times daily. 08/10/18  Yes Jacqlyn Larsen, PA-C  senna (SENOKOT) 8.6 MG tablet Take 1-2 tablets by mouth daily as needed for constipation. Constipation   Yes [provider]  timolol (TIMOPTIC) 0.5 % ophthalmic solution Place 1 drop into both eyes daily. 01/12/17  Yes [provider]    Blood pressure 103/70, pulse 79, temperature 98 F (36.7 C), temperature source Oral, resp. rate (!) 23, height _0  (1.93 m), weight 115.2 kg, SpO2 99 %. Physical Exam: General: pleasant, WD/WN white male who is laying in bed in NAD HEENT: head is normocephalic, atraumatic.  Sclera are noninjected. Mild scleral icterus.  Pupils equal and round.  Ears and nose without any masses or lesions.  Mouth is pink and moist. Dentition fair Heart: Irregular rhythm with regular rate.  No obvious murmurs, gallops, or rubs noted.  Palpable pedal pulses bilaterally Lungs: CTAB, no wheezes, rhonchi, or rales noted.  Respiratory effort nonlabored Abd: Soft, protuberant with mild distension,  tenderness of the epigastrium and right upper quadrant with voluntary guarding.  No rebound or rigidity. +BS.   MS: all 4 extremities are symmetrical with no cyanosis, clubbing, or edema. Skin: warm and dry with no masses, lesions, or rashes Psych: A&Ox3 with an appropriate affect. Neuro: cranial nerves grossly intact, extremity CSM intact bilaterally, normal speech  Results for orders placed or performed during the hospital encounter of 08/31/19 (from the past 48 hour(s))  CBC with Differential     Status: Abnormal   Collection Time: 08/31/19  3:23 PM  Result Value Ref Range   WBC 14.0 (H) 4.0 - 10.5 K/uL   RBC 5.17 4.22 - 5.81 MIL/uL   Hemoglobin 16.1 13.0 - 17.0 g/dL   HCT 48.2 39.0 - 52.0 %   MCV 93.2 80.0 - 100.0 fL   MCH 31.1 26.0 - 34.0 pg   MCHC 33.4 30.0 - 36.0 g/dL   RDW 14.2 11.5 - 15.5 %   Platelets 175 150 - 400 K/uL   nRBC 0.0 0.0 - 0.2 %   Neutrophils Relative % 85 %   Neutro Abs 11.8 (H) 1.7 - 7.7 K/uL   Lymphocytes Relative 7 %   Lymphs Abs 0.9 0.7 - 4.0 K/uL   Monocytes Relative 8 %   Monocytes Absolute 1.1 (H) 0.1 - 1.0 K/uL   Eosinophils Relative 0 %   Eosinophils Absolute 0.1 0.0 - 0.5 K/uL   Basophils Relative 0 %   Basophils Absolute 0.0 0.0 - 0.1 K/uL   Immature Granulocytes 0 %   Abs Immature Granulocytes 0.04 0.00 - 0.07 K/uL    Comment: Performed at [facility], [address] [place], [place] [address]  Comprehensive metabolic panel     Status: Abnormal   Collection Time: 08/31/19  3:23 PM  Result Value Ref Range   Sodium 137 135 - 145 mmol/L   Potassium 3.8 3.5 - 5.1 mmol/L   Chloride 101 98 - 111 mmol/L   CO2 24 22 - 32 mmol/L   Glucose, Bld 142 (H) 70 - 99 mg/dL   BUN 21 8 - 23 mg/dL   Creatinine, Ser 1.31 (H)  0.61 - 1.24 mg/dL   Calcium 9.2 8.9 - 10.3 mg/dL   Total Protein 7.8 6.5 - 8.1 g/dL   Albumin 4.1 3.5 - 5.0 g/dL   AST 359 (H) 15 - 41 U/L   ALT 610 (H) 0 - 44 U/L   Alkaline Phosphatase 169 (H) 38 - 126 U/L   Total Bilirubin 3.5  (H) 0.3 - 1.2 mg/dL   GFR calc non Af Amer 54 (L) >60 mL/min   GFR calc Af Amer >60 >60 mL/min   Anion gap 12 5 - 15    Comment: Performed at [facility], [address], [place], [place] [address]  Lipase, blood     Status: None   Collection Time: 08/31/19  3:23 PM  Result Value Ref Range   Lipase 23 11 - 51 U/L    Comment: Performed at [facility], [address] [place], [place] [address]  Lactic acid, plasma     Status: None   Collection Time: 08/31/19  4:03 PM  Result Value Ref Range   Lactic Acid, Venous 1.9 0.5 - 1.9 mmol/L    Comment: Performed at [facility], [address], [place], [place] [address]  APTT     Status: None   Collection Time: 08/31/19  4:03 PM  Result Value Ref Range   aPTT 30 24 - 36 seconds    Comment: Performed at [facility], [address], [place], [place] [address]  Protime-INR     Status: None   Collection Time: 08/31/19  4:03 PM  Result Value Ref Range   Prothrombin Time 14.0 11.4 - 15.2 seconds   INR 1.1 0.8 - 1.2    Comment: (NOTE) INR goal varies based on device and disease states. Performed at [facility], [address], [place], [place] [address]   Blood Culture (routine x 2)     Status: None (Preliminary result)   Collection Time: 08/31/19  4:03 PM   Specimen: BLOOD RIGHT ARM  Result Value Ref Range   Specimen Description      BLOOD RIGHT ARM BOTTLES DRAWN AEROBIC AND ANAEROBIC   Special Requests Blood Culture adequate volume    Culture      NO GROWTH 2 DAYS Performed at [facility], [address], [place], [place] [address]    Report Status PENDING   Blood Culture (routine x 2)     Status: None (Preliminary result)   Collection Time: 08/31/19  4:13 PM   Specimen: BLOOD RIGHT HAND  Result Value Ref Range   Specimen Description      BLOOD RIGHT HAND BOTTLES DRAWN AEROBIC AND ANAEROBIC   Special Requests Blood Culture adequate volume    Culture      NO GROWTH 2 DAYS Performed at [facility], [address] [place],  [place] [address]    Report Status PENDING   Lactic acid, plasma     Status: None   Collection Time: 08/31/19  5:39 PM  Result Value Ref Range   Lactic Acid, Venous 1.8 0.5 - 1.9 mmol/L    Comment: Performed at [facility], [address] [place], [place] [address]  Urinalysis, Routine w reflex microscopic     Status: Abnormal   Collection Time: 08/31/19  6:42 PM  Result Value Ref Range   Color, Urine AMBER (A) YELLOW    Comment: BIOCHEMICALS MAY BE AFFECTED BY COLOR   APPearance CLEAR CLEAR   Specific Gravity, Urine >1.046 (H) 1.005 - 1.030   pH 5.0 5.0 -  8.0   Glucose, UA NEGATIVE NEGATIVE mg/dL   Hgb urine dipstick SMALL (A) NEGATIVE   Bilirubin Urine MODERATE (A) NEGATIVE   Ketones, ur NEGATIVE NEGATIVE mg/dL   Protein, ur 30 (A) NEGATIVE mg/dL   Nitrite NEGATIVE NEGATIVE   Leukocytes,Ua NEGATIVE NEGATIVE   RBC / HPF 11-20 0 - 5 RBC/hpf   WBC, UA 0-5 0 - 5 WBC/hpf   Bacteria, UA NONE SEEN NONE SEEN   Squamous Epithelial / LPF 0-5 0 - 5   Mucus PRESENT     Comment: Performed at [facility], [address], [place], [place] [address]  Urine culture     Status: Abnormal   Collection Time: 08/31/19  6:42 PM   Specimen: In/Out Cath Urine  Result Value Ref Range   Specimen Description      IN/OUT CATH URINE Performed at [facility], [address], [place], [place] [address]    Special Requests      NONE Performed at [facility], [address], [place], [place] [address]    Culture MULTIPLE SPECIES PRESENT, SUGGEST RECOLLECTION (A)    Report Status 09/02/2019 FINAL   SARS Coronavirus 2 [facility] order, Performed in [facility] hospital lab) Nasopharyngeal Urine, Clean Catch     Status: None   Collection Time: 08/31/19  6:42 PM   Specimen: Urine, Clean Catch; Nasopharyngeal  Result Value Ref Range   SARS Coronavirus 2 NEGATIVE NEGATIVE    Comment: (NOTE) If result is NEGATIVE SARS-CoV-2 target nucleic acids are NOT DETECTED. The SARS-CoV-2 RNA is generally detectable in upper  and lower  respiratory specimens during the acute phase of infection. The lowest  concentration of SARS-CoV-2 viral copies this assay can detect is 250  copies / mL. A negative result does not preclude SARS-CoV-2 infection  and should not be used as the sole basis for treatment or other  patient management decisions.  A negative result may occur with  improper specimen collection / handling, submission of specimen other  than nasopharyngeal swab, presence of viral mutation(s) within the  areas targeted by this assay, and inadequate number of viral copies  (<250 copies / mL). A negative result must be combined with clinical  observations, patient history, and epidemiological information. If result is POSITIVE SARS-CoV-2 target nucleic acids are DETECTED. The SARS-CoV-2 RNA is generally detectable in upper and lower  respiratory specimens dur ing the acute phase of infection.  Positive  results are indicative of active infection with SARS-CoV-2.  Clinical  correlation with patient history and other diagnostic information is  necessary to determine patient infection status.  Positive results do  not rule out bacterial infection or co-infection with other viruses. If result is PRESUMPTIVE POSTIVE SARS-CoV-2 nucleic acids MAY BE PRESENT.   A presumptive positive result was obtained on the submitted specimen  and confirmed on repeat testing.  While 2019 novel coronavirus  (SARS-CoV-2) nucleic acids may be present in the submitted sample  additional confirmatory testing may be necessary for epidemiological  and / or clinical management purposes  to differentiate between  SARS-CoV-2 and other Sarbecovirus currently known to infect humans.  If clinically indicated additional testing with an alternate test  methodology [phone]) is advised. The SARS-CoV-2 RNA is generally  detectable in upper and lower respiratory sp ecimens during the acute  phase of infection. The expected result is  Negative. Fact Sheet for Patients:  StrictlyIdeas.no Fact Sheet for Healthcare Providers: BankingDealers.co.za This test is not yet approved or cleared by the [place]  FDA and has been authorized for detection and/or diagnosis of SARS-CoV-2 by FDA under an Emergency Use Authorization (EUA).  This EUA will remain in effect (meaning this test can be used) for the duration of the COVID-19 declaration under Section 564(b)(1) of the Act, 21 U.S.C. section 360bbb-3(b)(1), unless the authorization is terminated or revoked sooner. Performed at [facility], [address], [place], [place] [address]   Troponin I (High Sensitivity)     Status: None   Collection Time: 08/31/19  8:29 PM  Result Value Ref Range   Troponin I (High Sensitivity) 11 <18 ng/L    Comment: (NOTE) Elevated high sensitivity troponin I (hsTnI) values and significant  changes across serial measurements may suggest ACS but many other  chronic and acute conditions are known to elevate hsTnI results.  Refer to the "Links" section for chest pain algorithms and additional  guidance. Performed at [facility], [address], [place], [place] [address]   CBG monitoring, ED     Status: Abnormal   Collection Time: 08/31/19  8:41 PM  Result Value Ref Range   Glucose-Capillary 135 (H) 70 - 99 mg/dL  MRSA PCR Screening     Status: None   Collection Time: 08/31/19 11:08 PM   Specimen: Nasal Mucosa; Nasopharyngeal  Result Value Ref Range   MRSA by PCR NEGATIVE NEGATIVE    Comment:        The GeneXpert MRSA Assay (FDA approved for NASAL specimens only), is one component of a comprehensive MRSA colonization surveillance program. It is not intended to diagnose MRSA infection nor to guide or monitor treatment for MRSA infections. Performed at [facility], [place] [address] [place], [place] [address]   Glucose, capillary     Status: Abnormal   Collection Time:  08/31/19 11:22 PM  Result Value Ref Range   Glucose-Capillary 172 (H) 70 - 99 mg/dL   Comment 1 Notify RN    Comment 2 Document in Chart   Glucose, capillary     Status: Abnormal   Collection Time: 09/01/19  4:08 AM  Result Value Ref Range   Glucose-Capillary 137 (H) 70 - 99 mg/dL   Comment 1 Notify RN    Comment 2 Document in Chart   Glucose, capillary     Status: Abnormal   Collection Time: 09/01/19  7:43 AM  Result Value Ref Range   Glucose-Capillary 105 (H) 70 - 99 mg/dL   Comment 1 Notify RN   CBC     Status: Abnormal   Collection Time: 09/01/19 11:02 AM  Result Value Ref Range   WBC 14.2 (H) 4.0 - 10.5 K/uL   RBC 4.52 4.22 - 5.81 MIL/uL   Hemoglobin 13.7 13.0 - 17.0 g/dL   HCT 42.8 39.0 - 52.0 %   MCV 94.7 80.0 - 100.0 fL   MCH 30.3 26.0 - 34.0 pg   MCHC 32.0 30.0 - 36.0 g/dL   RDW 14.7 11.5 - 15.5 %   Platelets 132 (L) 150 - 400 K/uL   nRBC 0.0 0.0 - 0.2 %    Comment: Performed at [facility], [place] [address] [place],  [address]  Comprehensive metabolic panel     Status: Abnormal   Collection Time: 09/01/19 11:03 AM  Result Value Ref Range   Sodium 139 135 - 145 mmol/L   Potassium 3.7 3.5 - 5.1 mmol/L   Chloride 105 98 - 111 mmol/L   CO2 24 22 - 32 mmol/L   Glucose, Bld  112 (H) 70 - 99 mg/dL   BUN 23 8 - 23 mg/dL   Creatinine, Ser 1.43 (H) 0.61 - 1.24 mg/dL   Calcium 8.4 (L) 8.9 - 10.3 mg/dL   Total Protein 6.6 6.5 - 8.1 g/dL   Albumin 3.6 3.5 - 5.0 g/dL   AST 208 (H) 15 - 41 U/L   ALT 446 (H) 0 - 44 U/L   Alkaline Phosphatase 165 (H) 38 - 126 U/L   Total Bilirubin 4.2 (H) 0.3 - 1.2 mg/dL   GFR calc non Af Amer 49 (L) >60 mL/min   GFR calc Af Amer 56 (L) >60 mL/min   Anion gap 10 5 - 15    Comment: Performed at [facility], [place] [address] [place], [place] [address]  Heparin level (unfractionated)     Status: None   Collection Time: 09/01/19 11:03 AM  Result Value Ref Range   Heparin Unfractionated 0.31 0.30 -  0.70 IU/mL    Comment: (NOTE) If heparin results are below expected values, and patient dosage has  been confirmed, suggest follow up testing of antithrombin III levels. Performed at [facility], [place] [address], [place], [place] [address]   Glucose, capillary     Status: Abnormal   Collection Time: 09/01/19 12:02 PM  Result Value Ref Range   Glucose-Capillary 137 (H) 70 - 99 mg/dL   Comment 1 Notify RN   Glucose, capillary     Status: Abnormal   Collection Time: 09/01/19  3:46 PM  Result Value Ref Range   Glucose-Capillary 146 (H) 70 - 99 mg/dL   Comment 1 Notify RN   Heparin level (unfractionated)     Status: Abnormal   Collection Time: 09/01/19  6:37 PM  Result Value Ref Range   Heparin Unfractionated 0.24 (L) 0.30 - 0.70 IU/mL    Comment: (NOTE) If heparin results are below expected values, and patient dosage has  been confirmed, suggest follow up testing of antithrombin III levels. Performed at [facility], [place] [address] [place], [place] [address]   Glucose, capillary     Status: Abnormal   Collection Time: 09/01/19  8:16 PM  Result Value Ref Range   Glucose-Capillary 136 (H) 70 - 99 mg/dL  Glucose, capillary     Status: Abnormal   Collection Time: 09/01/19 11:44 PM  Result Value Ref Range   Glucose-Capillary 111 (H) 70 - 99 mg/dL   Comment 1 Notify RN    Comment 2 Document in Chart   Surgical pcr screen     Status: None   Collection Time: 09/02/19  1:36 AM   Specimen: Nasal Mucosa; Nasal Swab  Result Value Ref Range   MRSA, PCR NEGATIVE NEGATIVE   Staphylococcus aureus NEGATIVE NEGATIVE    Comment: (NOTE) The Xpert SA Assay (FDA approved for NASAL specimens in patients 71 years of age and older), is one component of a comprehensive surveillance program. It is not intended to diagnose infection nor to guide or monitor treatment. Performed at [facility], [place] [address] [place], [place] [address]    Comprehensive metabolic panel     Status: Abnormal   Collection Time: 09/02/19  2:10 AM  Result Value Ref Range   Sodium 135 135 - 145 mmol/L   Potassium 3.2 (L) 3.5 - 5.1 mmol/L   Chloride 105 98 - 111 mmol/L   CO2 23 22 - 32 mmol/L   Glucose, Bld 117 (H) 70 - 99 mg/dL  BUN 17 8 - 23 mg/dL   Creatinine, Ser 1.00 0.61 - 1.24 mg/dL   Calcium 7.6 (L) 8.9 - 10.3 mg/dL   Total Protein 5.5 (L) 6.5 - 8.1 g/dL   Albumin 2.8 (L) 3.5 - 5.0 g/dL   AST 110 (H) 15 - 41 U/L   ALT 292 (H) 0 - 44 U/L   Alkaline Phosphatase 139 (H) 38 - 126 U/L   Total Bilirubin 4.1 (H) 0.3 - 1.2 mg/dL   GFR calc non Af Amer >60 >60 mL/min   GFR calc Af Amer >60 >60 mL/min   Anion gap 7 5 - 15    Comment: Performed at [facility], [place] [address] [place], [place] [address]  Glucose, capillary     Status: Abnormal   Collection Time: 09/02/19  3:47 AM  Result Value Ref Range   Glucose-Capillary 113 (H) 70 - 99 mg/dL  Glucose, capillary     Status: Abnormal   Collection Time: 09/02/19  7:04 AM  Result Value Ref Range   Glucose-Capillary 100 (H) 70 - 99 mg/dL   Ct Abdomen Pelvis W Contrast  Result Date: 08/31/2019 CLINICAL DATA:  Acute, diffuse abdominal pain with nausea, vomiting and fever. Chills. History of non-Hodgkin's lymphoma. Smoker. EXAM: CT ABDOMEN AND PELVIS WITH CONTRAST TECHNIQUE: Multidetector CT imaging of the abdomen and pelvis was performed using the standard protocol following bolus administration of intravenous contrast. CONTRAST:  115m OMNIPAQUE IOHEXOL 300 MG/ML  SOLN COMPARISON:  07/21/2016 FINDINGS: Lower chest: Tiny amount of pericardial fluid with a maximum thickness of 5 mm, without significant change. Normal sized heart. Mild peribronchial thickening at the lung bases. Hepatobiliary: Interval mild intrahepatic biliary ductal dilatation and dilatation of the common duct. The proximal common duct measures 2.0 cm in maximum diameter. Possible small noncalcified stones in the  distal common duct. Probable small noncalcified gallstones in the gallbladder, measuring up to 5 mm in maximum diameter each. No gallbladder wall thickening or pericholecystic fluid. Pancreas: Unremarkable. No pancreatic ductal dilatation or surrounding inflammatory changes. Spleen: Normal in size without focal abnormality. Adrenals/Urinary Tract: Normal appearing adrenal glands. 4 mm lower pole right renal calculus. Unremarkable left kidney, ureters and urinary bladder. Stomach/Bowel: Large number of colonic diverticula without evidence of diverticulitis. Normal appearing stomach, small bowel and appendix. Vascular/Lymphatic: Atheromatous arterial calcifications without aneurysm. No enlarged lymph nodes. Reproductive: Prostate is unremarkable. Other: Interval repair of the previously demonstrated left inguinal hernia. Small right inguinal hernia containing fat. Musculoskeletal: Left hip prosthesis. 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 Lumbar and lower thoracic spine degenerative changes with a stable 25% L3 status vertebral compression deformity with no acute fracture lines or bony retropulsion. IMPRESSION: 1. Interval mild intrahepatic biliary ductal dilatation and marked dilatation of the proximal common duct with probable small noncalcified stones in the distal common duct. 2. Probable cholelithiasis. 3. 4 mm nonobstructing lower pole right renal calculus. 4. Extensive colonic diverticulosis. 5. Interval repair of the previously demonstrated left inguinal hernia. 6. Small right inguinal hernia containing fat. 7. Right femoral head avascular necrosis and moderate right hip degenerative changes. Electronically Signed   By: SClaudie ReveringM.D.   On: 08/31/2019 17:55   Dg Chest Port 1 View  Result Date: 08/31/2019 CLINICAL DATA:  Fever EXAM: PORTABLE CHEST 1 VIEW COMPARISON:  Portable exam 1546 hours compared to 12/17/2012 FINDINGS: Rotated to the  RIGHT. Normal heart size, mediastinal contours, and pulmonary vascularity. Lungs clear. No infiltrate, pleural  effusion or pneumothorax. Osseous structures unremarkable. IMPRESSION: No acute abnormalities. Electronically Signed   By: Lavonia Dana M.D.   On: 08/31/2019 15:59   Dg Ercp  Result Date: 09/02/2019 CLINICAL DATA:  73 year old male with biliary ductal dilatation and probable choledocholithiasis. EXAM: ERCP TECHNIQUE: Multiple spot images obtained with the fluoroscopic device and submitted for interpretation post-procedure. FLUOROSCOPY TIME:  Fluoroscopy Time:  3 minutes 57 seconds Radiation Exposure Index (if provided by the fluoroscopic device): 129 mGy COMPARISON:  None. FINDINGS: These images were submitted for radiologic interpretation only. Please see the procedural report for the amount of contrast and the fluoroscopy time utilized. A total of 17 saved images and 3 cine clips are submitted for review. The images demonstrate a flexible endoscope in the descending duodenum with deep wire cannulation of the intrahepatic ducts followed by sphincterotomy, balloon dilation and balloon sweeping of the common bile duct. On the initial cholangiogram images, there is biliary ductal dilatation and mobile filling defects in the distal common duct most consistent with choledocholithiasis. On the final image, a plastic biliary stent has been placed. IMPRESSION: 1. Choledocholithiasis. 2. ERCP with sphincterotomy, balloon sweep of the common duct and placement of plastic biliary stent. Electronically Signed   By: Jacqulynn Cadet M.D.   On: 09/02/2019 10:15   Anti-infectives (From admission, onward)   Start     Dose/Rate Route Frequency Ordered Stop   09/01/19 0700  vancomycin (VANCOCIN) IVPB 750 mg/150 ml premix  Status:  Discontinued     750 mg 150 mL/hr over 60 Minutes Intravenous Every 12 hours 08/31/19 2056 09/01/19 1016   08/31/19 2200  piperacillin-tazobactam (ZOSYN) IVPB 3.375 g     3.375 g 12.5  mL/hr over 240 Minutes Intravenous Every 8 hours 08/31/19 2055     08/31/19 2100  vancomycin (VANCOCIN) IVPB 1000 mg/200 mL premix  Status:  Discontinued     1,000 mg 200 mL/hr over 60 Minutes Intravenous Every 1 hr x 2 08/31/19 2055 08/31/19 2259   08/31/19 1545  cefTRIAXone (ROCEPHIN) 2 g in sodium chloride 0.9 % 100 mL IVPB     2 g 200 mL/hr over 30 Minutes Intravenous  Once 08/31/19 1541 08/31/19 1631   08/31/19 1545  metroNIDAZOLE (FLAGYL) IVPB 500 mg     500 mg 100 mL/hr over 60 Minutes Intravenous  Once 08/31/19 1541 08/31/19 1650      Assessment/Plan Hx of NHL Paroxysmal A Fib COPD on 2L prn OSA DM HTN  Cholangitis Choledocholithiasis - S/p ERCP with stenting 9/8 - Agree with IV abx - Will discuss with MD Lap Chole vs Perc Chole drain  ID - Zosyn  VTE - SCDs, Heparin on hold FEN - NPO  Jillyn Ledger, [facility] 09/02/2019, 11:17 AM Pager: [phone]

## 2019-09-02 NOTE — Anesthesia Procedure Notes (Signed)
Procedure Name: Intubation Date/Time: 09/02/2019 7:42 AM Performed by: Lieutenant Diego, CRNA Pre-anesthesia Checklist: Patient identified, Emergency Drugs available, Suction available and Patient being monitored Patient Re-evaluated:Patient Re-evaluated prior to induction Oxygen Delivery Method: Circle system utilized Preoxygenation: Pre-oxygenation with 100% oxygen Induction Type: IV induction Ventilation: Mask ventilation without difficulty Laryngoscope Size: Miller and 2 Grade View: Grade I Tube type: Oral Tube size: 7.5 mm Number of attempts: 1 Airway Equipment and Method: Stylet and Oral airway Placement Confirmation: ETT inserted through vocal cords under direct vision,  positive ETCO2 and breath sounds checked- equal and bilateral Secured at: 23 cm Tube secured with: Tape Dental Injury: Teeth and Oropharynx as per pre-operative assessment

## 2019-09-02 NOTE — Transfer of Care (Signed)
Immediate Anesthesia Transfer of Care Note  Patient: [name]  Procedure(s) Performed: ENDOSCOPIC RETROGRADE CHOLANGIOPANCREATOGRAPHY (ERCP) (N/A ) SPHINCTEROTOMY BILIARY DILATION REMOVAL OF STONES BIOPSY BILIARY STENT PLACEMENT (N/A )  Patient Location: Endoscopy Unit  Anesthesia Type:General  Level of Consciousness: awake and alert   Airway & Oxygen Therapy: Patient Spontanous Breathing and Patient connected to face mask oxygen  Post-op Assessment: Report given to RN and Post -op Vital signs reviewed and stable  Post vital signs: Reviewed and stable  Last Vitals:  Vitals Value Taken Time  BP 100/46 09/02/19 0930  Temp    Pulse 90 09/02/19 0947  Resp 8 09/02/19 0947  SpO2 97 % 09/02/19 0947  Vitals shown include unvalidated device data.  Last Pain:  Vitals:   09/02/19 0906  TempSrc:   PainSc: 0-No pain      Patients Stated Pain Goal: 3 (0000000 [id number])  Complications: No apparent anesthesia complications

## 2019-09-02 NOTE — Interval H&P Note (Signed)
History and Physical Interval Note:  09/02/2019 7:30 AM  [name]  has presented today for surgery, with the diagnosis of CBD stones.  The various methods of treatment have been discussed with the patient and family. After consideration of risks, benefits and other options for treatment, the patient has consented to  Procedure(s): ENDOSCOPIC RETROGRADE CHOLANGIOPANCREATOGRAPHY (ERCP) (N/A) as a surgical intervention.  The patient's history has been reviewed, patient examined, no change in status, stable for surgery.  I have reviewed the patient's chart and labs.  Questions were answered to the patient's satisfaction.    The risks of an ERCP were discussed at length, including but not limited to the risk of perforation, bleeding, abdominal pain, post-ERCP pancreatitis (while usually mild can be severe and even life threatening).    [employer]

## 2019-09-02 NOTE — Progress Notes (Signed)
ANTICOAGULATION CONSULT NOTE - Follow Up Consult  Pharmacy Consult for heparin Indication: atrial fibrillation  Allergies  Allergen Reactions  . Codeine Other (See Comments)    REACTION: Shakes   . Pred Forte [Prednisolone Acetate] Other (See Comments)    Severe burning   Patient Measurements: Height: 6\' 4"  (193 cm) Weight: 253 lb 15.5 oz (115.2 kg) IBW/kg (Calculated) : 86.8 Heparin Dosing Weight: 109 kg  Vital Signs: Temp: 98 F (36.7 C) (09/08 1002) Temp Source: Oral (09/08 1002) BP: 119/70 (09/08 1200) Pulse Rate: 74 (09/08 1200)  Labs: Recent Labs    08/31/19 1523 08/31/19 1603 08/31/19 2029 09/01/19 1102 09/01/19 1103 09/01/19 1837 09/02/19 0210  HGB 16.1  --   --  13.7  --   --   --   HCT 48.2  --   --  42.8  --   --   --   PLT 175  --   --  132*  --   --   --   APTT  --  30  --   --   --   --   --   LABPROT  --  14.0  --   --   --   --   --   INR  --  1.1  --   --   --   --   --   HEPARINUNFRC  --   --   --   --  0.31 0.24*  --   CREATININE 1.31*  --   --   --  1.43*  --  1.00  TROPONINIHS  --   --  11  --   --   --   --    Estimated Creatinine Clearance: 92.7 mL/min (by C-G formula based on SCr of 1 mg/dL).  Medical History: Past Medical History:  Diagnosis Date  . Atrial fibrillation ([place])   . Avascular necrosis of bones of both hips ([place])   . 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 Cancer ([place])   . Colonic polyp   . Complication of anesthesia    hard to wake up, "felt like died and revived me"  . COPD (chronic obstructive pulmonary disease) ([facility])    mild PFT abnormalities; normal ABG  . Degenerative disc disease, lumbar    HNP of the LS spine  . Diabetes mellitus    type 2  . GERD (gastroesophageal reflux disease)   . Glaucoma   . H/O hiatal hernia   . Hx MRSA infection 2010   sith splenic abcess  . Non Hodgkin's lymphoma ([place]) 06/2009   chemotherapy until 11/2010  . Obesity   . Obstructive sleep apnea    treated with CPAP  .  Orthostatic hypotension    lightheadness; no definate loss of consciousness  . Pedal edema   . PONV (postoperative nausea and vomiting)   . Splenic abscess 07/2009  . Tobacco abuse    45 pack years    Assessment: Patient admitted with abdominal pain - found to have CBD stones and biliary obstruction. Heparin drip initiated on admission for atrial fibrillation -  patient was not taking anticoagulants PTA with hx of Afib  Baseline labs:  Hgb 16.1,Plt 175, INR 1.1, APTT 30 seconds  9/7 Heparin 4000 unit bolus, infusion at 1400 units/hr 1100 Hep level = 0.31 units/ml, in therapeutic range 1837 Hep level = 0.24, rate increased to 1600 units/hr, to stop at 1:30am 9/8 for ERCP  Today, 09/02/19 Heparin off  as of 0130, ERCP completed No surgery planned for 6-8 weeks per MD Resume Heparin at 8pm tonight 9/8  Goal of Therapy:  Heparin level 0.3-0.7 units/ml Monitor platelets by anticoagulation protocol: Yes   Plan:  Resume Heparin at 1600 units/hr at 8pm tonight (9/8) First Heparin level with am labs 9/9 Daily CBC while on Heparin, daily Hep level when at steady state Monitor for signs/symptoms of bleeding or thrombosis  Minda Ditto, PharmD 09/02/2019,2:02 PM

## 2019-09-03 ENCOUNTER — Inpatient Hospital Stay (HOSPITAL_COMMUNITY): Payer: Medicare Other

## 2019-09-03 DIAGNOSIS — E1151 Type 2 diabetes mellitus with diabetic peripheral angiopathy without gangrene: Secondary | ICD-10-CM

## 2019-09-03 DIAGNOSIS — I10 Essential (primary) hypertension: Secondary | ICD-10-CM

## 2019-09-03 DIAGNOSIS — E7800 Pure hypercholesterolemia, unspecified: Secondary | ICD-10-CM

## 2019-09-03 DIAGNOSIS — I4891 Unspecified atrial fibrillation: Secondary | ICD-10-CM

## 2019-09-03 DIAGNOSIS — Z01810 Encounter for preprocedural cardiovascular examination: Secondary | ICD-10-CM

## 2019-09-03 LAB — COMPREHENSIVE METABOLIC PANEL
ALT: 215 U/L — ABNORMAL HIGH (ref 0–44)
AST: 65 U/L — ABNORMAL HIGH (ref 15–41)
Albumin: 2.8 g/dL — ABNORMAL LOW (ref 3.5–5.0)
Alkaline Phosphatase: 179 U/L — ABNORMAL HIGH (ref 38–126)
Anion gap: 9 (ref 5–15)
BUN: 19 mg/dL (ref 8–23)
CO2: 23 mmol/L (ref 22–32)
Calcium: 7.8 mg/dL — ABNORMAL LOW (ref 8.9–10.3)
Chloride: 105 mmol/L (ref 98–111)
Creatinine, Ser: 1.05 mg/dL (ref 0.61–1.24)
GFR calc Af Amer: >60 mL/min (ref >60)
GFR calc non Af Amer: >60 mL/min (ref >60)
Glucose, Bld: 97 mg/dL (ref 70–99)
Potassium: 3.3 mmol/L — ABNORMAL LOW (ref 3.5–5.1)
Sodium: 137 mmol/L (ref 135–145)
Total Bilirubin: 4.9 mg/dL — ABNORMAL HIGH (ref 0.3–1.2)
Total Protein: 6 g/dL — ABNORMAL LOW (ref 6.5–8.1)

## 2019-09-03 LAB — CBC
HCT: 35.7 % — ABNORMAL LOW (ref 39.0–52.0)
Hemoglobin: 12.1 g/dL — ABNORMAL LOW (ref 13.0–17.0)
MCH: 31.6 pg (ref 26.0–34.0)
MCHC: 33.9 g/dL (ref 30.0–36.0)
MCV: 93.2 fL (ref 80.0–100.0)
Platelets: 94 10*3/uL — ABNORMAL LOW (ref 150–400)
RBC: 3.83 MIL/uL — ABNORMAL LOW (ref 4.22–5.81)
RDW: 15.1 % (ref 11.5–15.5)
WBC: 9.1 10*3/uL (ref 4.0–10.5)
nRBC: 0 % (ref 0.0–0.2)

## 2019-09-03 LAB — GLUCOSE, CAPILLARY
Glucose-Capillary: 90 mg/dL (ref 70–99)
Glucose-Capillary: 100 mg/dL — ABNORMAL HIGH (ref 70–99)
Glucose-Capillary: 104 mg/dL — ABNORMAL HIGH (ref 70–99)
Glucose-Capillary: 113 mg/dL — ABNORMAL HIGH (ref 70–99)
Glucose-Capillary: 118 mg/dL — ABNORMAL HIGH (ref 70–99)
Glucose-Capillary: 93 mg/dL (ref 70–99)

## 2019-09-03 LAB — HEPARIN LEVEL (UNFRACTIONATED)
Heparin Unfractionated: 0.14 IU/mL — ABNORMAL LOW (ref 0.30–0.70)
Heparin Unfractionated: 0.21 IU/mL — ABNORMAL LOW (ref 0.30–0.70)
Heparin Unfractionated: 0.3 IU/mL (ref 0.30–0.70)

## 2019-09-03 LAB — MAGNESIUM: Magnesium: 2 mg/dL (ref 1.7–2.4)

## 2019-09-03 LAB — TSH: TSH: 3.066 u[IU]/mL (ref 0.350–4.500)

## 2019-09-03 MED ORDER — POTASSIUM CHLORIDE CRYS ER 20 MEQ PO TBCR
40.0000 meq | EXTENDED_RELEASE_TABLET | Freq: Once | ORAL | Status: AC
  Administered 2019-09-03: 40 meq via ORAL
  Filled 2019-09-03: qty 2

## 2019-09-03 MED ORDER — MORPHINE SULFATE (PF) 2 MG/ML IV SOLN
INTRAVENOUS | Status: AC
  Filled 2019-09-03: qty 1

## 2019-09-03 MED ORDER — HEPARIN (PORCINE) 25000 UT/250ML-% IV SOLN
2050.0000 [IU]/h | INTRAVENOUS | Status: DC
  Administered 2019-09-03 – 2019-09-06 (×5): 2050 [IU]/h via INTRAVENOUS
  Filled 2019-09-03 (×4): qty 250

## 2019-09-03 MED ORDER — MORPHINE SULFATE (PF) 2 MG/ML IV SOLN
1.0000 mg | Freq: Once | INTRAVENOUS | Status: AC
  Administered 2019-09-03: 1 mg via INTRAVENOUS

## 2019-09-03 NOTE — Progress Notes (Signed)
Progress Note   Subjective  Patient is s/p ERCP with stone extraction yesterday. He is feeling better. Pain improved, he is hungry. He was started on heparin drip last night. Moved his bowels. No bleeding symptoms.   Objective   Vital signs in last 24 hours: Temp:  [97.4 F (36.3 C)-98 F (36.7 C)] 97.7 F (36.5 C) (09/09 0359) Pulse Rate:  [61-99] 68 (09/09 0500) Resp:  [8-23] 15 (09/09 0500) BP: (86-143)/(46-84) 122/78 (09/09 0500) SpO2:  [92 %-99 %] 93 % (09/09 0500) Weight:  WG:[id number] kg] 117 kg (09/09 0500) Last BM Date: 09/02/19 General:    white male in NAD Heart:  Regular rate and rhythm Lungs: Respirations even and unlabored, Abdomen:  Soft, mildly distended, mildly tender.  Extremities:  Without edema. Neurologic:  Alert and oriented,  grossly normal neurologically. Psych:  Cooperative. Normal mood and affect.  Intake/Output from previous day: 09/08 0701 - 09/09 0700 In: 1276.3 [P.O.:20; I.V.:865.4; IV Piggyback:390.8] Out: 300 [Urine:300] Intake/Output this shift: No intake/output data recorded.  Lab Results: Recent Labs    08/31/19 1523 09/01/19 1102 09/03/19 0517  WBC 14.0* 14.2* 9.1  HGB 16.1 13.7 12.1*  HCT 48.2 42.8 35.7*  PLT 175 132* 94*   BMET Recent Labs    09/01/19 1103 09/02/19 0210 09/03/19 0517  NA 139 135 137  K 3.7 3.2* 3.3*  CL 105 105 105  CO2 24 23 23   GLUCOSE 112* 117* 97  BUN 23 17 19   CREATININE 1.43* 1.00 1.05  CALCIUM 8.4* 7.6* 7.8*   LFT Recent Labs    09/03/19 0517  PROT 6.0*  ALBUMIN 2.8*  AST 65*  ALT 215*  ALKPHOS 179*  BILITOT 4.9*   PT/INR Recent Labs    08/31/19 1603  LABPROT 14.0  INR 1.1    Studies/Results: Dg Ercp  Result Date: 09/02/2019 CLINICAL DATA:  73 year old male with biliary ductal dilatation and probable choledocholithiasis. EXAM: ERCP TECHNIQUE: Multiple spot images obtained with the fluoroscopic device and submitted for interpretation post-procedure. FLUOROSCOPY TIME:   Fluoroscopy Time:  3 minutes 57 seconds Radiation Exposure Index (if provided by the fluoroscopic device): 129 mGy COMPARISON:  None. FINDINGS: These images were submitted for radiologic interpretation only. Please see the procedural report for the amount of contrast and the fluoroscopy time utilized. A total of 17 saved images and 3 cine clips are submitted for review. The images demonstrate a flexible endoscope in the descending duodenum with deep wire cannulation of the intrahepatic ducts followed by sphincterotomy, balloon dilation and balloon sweeping of the common bile duct. On the initial cholangiogram images, there is biliary ductal dilatation and mobile filling defects in the distal common duct most consistent with choledocholithiasis. On the final image, a plastic biliary stent has been placed. IMPRESSION: 1. Choledocholithiasis. 2. ERCP with sphincterotomy, balloon sweep of the common duct and placement of plastic biliary stent. Electronically Signed   By: Jacqulynn Cadet M.D.   On: 09/02/2019 10:15       Assessment / Plan:    73 y/o male admitted with choledocholithiasis / cholangitis, now s/p ERCP yesterday with Dr. Rush Landmark - had sphincterotomy, balloon dilation, stone extraction, stent placement with drainage of bile / puss. Post procedure he is doing well, pain improved, vitals stable, no fevers. WBC normalized. ALT and AST downtrended, bili slightly up today, will trend. He has a history of atrial fibrillation, not on anticoagulation at home, but started on heparin drip while inpatient. He is at risk for  post sphincterotomy bleeding in this setting. If plans are made to start him on NOAC for outpatient management, would do so no sooner than 72 hours post ERCP due to bleeding risk. Of note, gastritis and prominent ampulla noted on EGD, biopsies taken and pending, patient on PPI  Recommend: - slowly advance diet - continue antibiotics, PPI - defer anticoagulation regimen to primary  team for long term management of AF. If you plan to use NOAC would start no sooner than 72 hours post procedure, monitor for bleeding post-sphincterotomy - general surgery following to discuss possible cholecystectomy  - patient will need stent removal 6-8 weeks from now - repeat LFTs in the AM - await pathology results  Call with questions, will follow  [place] Cellar, MD [facility]

## 2019-09-03 NOTE — Progress Notes (Signed)
PROGRESS NOTE    [name]  MWU:[id number] DOB: 1946/05/02 DOA: 08/31/2019 PCP: Ryan Du, MD   Brief Narrative:73 y.o.male,w [name], NonHodgkins Lymphoma, Dm2, Pafib, Tobacco use, Copd, OSA on Cpap, presents with c/o abdominal pain x 4 days, worse with food, slight n/v, slight fever, chills. Pt denies cough, cp, palp, sob, diarrhea, brbpr, dysuria. Pt notes that urine is darker than normal.   In ED,   CT abd/ pelvis Musculoskeletal: Left hip prosthesis. 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 Lumbar and lower thoracic spine degenerative changes with a stable 25% L3 status vertebral compression deformity with no acute fracture lines or bony retropulsion.  IMPRESSION: 1. Interval mild intrahepatic biliary ductal dilatation and marked dilatation of the proximal common duct with probable small noncalcified stones in the distal common duct. 2. Probable cholelithiasis. 3. 4 mm nonobstructing lower pole right renal calculus. 4. Extensive colonic diverticulosis. 5. Interval repair of the previously demonstrated left inguinal hernia. 6. Small right inguinal hernia containing fat. 7. Right femoral head avascular necrosis and moderate right hip degenerative changes.  CXR IMPRESSION: No acute abnormalities. Wbc 14.0, Hgb 16.1, Plt 175 Na 137, K 3.8 Bun 21, Creatinine 1.31 Alb 4.1, Ast 359, Alt 610, Alk phos 169 . T. Bili 3.5 Lipase 23 Lactic acid 1.9  PTT 30 INR 1.1  Urinalysis SG >1.046 Rbc 11-20 Wbc 0-5   Blood culture x2  covid-19 negative  Pt will be admitted for sepsis (Fever, tachycardia Assessment & Plan:   Principal Problem:   Sepsis ([place]) Active Problems:   Diabetes ([place])   TOBACCO ABUSE   Atrial fibrillation with RVR ([facility])   COPD (chronic obstructive pulmonary disease) ([facility])   Obstructive sleep apnea   ARF (acute renal failure) ([facility])   Abnormal liver function   Common  biliary duct obstruction   Acute cholangitis    #1 sepsis present on admission-patient admitted with fever tachycardia and hypotension likely secondary to cholangitis-with right upper quadrant pain with CT findings consistent with biliary ductal dilatation and marked dilatation of the proximal common duct and with probable small noncalcified stones in the distal common duct.   He is status post ERCP 09/02/2019.  All intrahepatic ducts were severely dilated with a stone causing obstruction, choledocholithiasis and cholangitis and biliary sludge was found, complete removal was accomplished by biliary sphincterotomy and sphincteroplasty and balloon sweeping.  I have consulted general surgery for possible cholecystectomy.  ALT 215 down from 292 and AST 65 down from 110.  However total bilirubin is still elevated at 4.9.follow up in am wbc normalized  -he certainly has risk factors including copd and afib and may be cad ...with dm..will repeat ekg today.echocardiogram near normal ef..   #2 paroxysmal A. fib with RVR-patient and patient's family reports that he has a history of atrial fibrillation. Continue po cardizem and heparin.ekg at the time of admit with st t wave changes will repeat today Will hold off on starting DOAC 72 hrs after ercp..  #3 COPD/obstructive sleep apnea continue CPAP at night and DuoNeb.  #4 type 2 diabetes Metformin on hold continue sliding scale  #5 acute renal failure resolved with IV hydration creatinine 1.0 today continue IV hydration labs pending for today.  #6 glaucoma continue eyedrops Timoptic and Xalatan  #7 microscopic hematuria patient has nonobstructive renal stones could be the cause will need outpatient follow-up with urology.  #8 hypokalemia replete potassium.  DVT prophylaxis:Heparin  code Status:Full code  family Communication:dw  daughter Disposition Plan:Pending clinical improvement Consultants:[name] , General  surgery  Procedures:Echo 09/01/2019 Antimicrobials:Zosyn       Nutrition Problem: Inadequate oral intake Etiology: acute illness(abdominal pain, nausea, vomiting, possible common bile duct obstruction)     Signs/Symptoms: (per chart, clear liquid diet)    Interventions: Refer to RD note for recommendations  Estimated body mass index is 31.4 kg/m as calculated from the following:   Height as of this encounter: '6\' 4"'  (1.93 m).   Weight as of this encounter: 117 kg.     Subjective:  Resting in bed feels better no nause or vomiting no bm Objective: Vitals:   09/03/19 0400 09/03/19 0500 09/03/19 0746 09/03/19 0800  BP: 117/74 122/78  116/64  Pulse: 78 68  66  Resp: '16 15  15  ' Temp:   97.9 F (36.6 C)   TempSrc:   Oral   SpO2: 92% 93%  95%  Weight:  117 kg    Height:        Intake/Output Summary (Last 24 hours) at 09/03/2019 0921 Last data filed at 09/03/2019 0300 Gross per 24 hour  Intake 976.27 ml  Output -  Net 976.27 ml   Filed Weights   09/02/19 0539 09/02/19 0702 09/03/19 0500  Weight: 115.2 kg 115.2 kg 117 kg    Examination:  General exam: Appears calm and comfortable  Respiratory system: Clear to auscultation. Respiratory effort normal. Cardiovascular system: S1 & S2 heard, RRR. No JVD, murmurs, rubs, gallops or clicks. No pedal edema. Gastrointestinal system: Abdomen is nondistended, soft and mild generalized tender. No organomegaly or masses felt. Normal bowel sounds heard. Central nervous system: Alert and oriented. No focal neurological deficits. Extremities: Symmetric 5 x 5 power. Skin: No rashes, lesions or ulcers Psychiatry: Judgement and insight appear normal. Mood & affect appropriate.     Data Reviewed: I have personally reviewed following labs and imaging studies  CBC: Recent Labs  Lab 08/31/19 1523 09/01/19 1102 09/03/19 0517  WBC 14.0* 14.2* 9.1  NEUTROABS 11.8*  --   --   HGB 16.1 13.7 12.1*  HCT 48.2 42.8 35.7*  MCV  93.2 94.7 93.2  PLT 175 132* 94*   Basic Metabolic Panel: Recent Labs  Lab 08/31/19 1523 09/01/19 1103 09/02/19 0210 09/03/19 0517  NA 137 139 135 137  K 3.8 3.7 3.2* 3.3*  CL 101 105 105 105  CO2 '24 24 23 23  ' GLUCOSE 142* 112* 117* 97  BUN '21 23 17 19  ' CREATININE 1.31* 1.43* 1.00 1.05  CALCIUM 9.2 8.4* 7.6* 7.8*  MG  --   --   --  2.0   GFR: Estimated Creatinine Clearance: 89 mL/min (by C-G formula based on SCr of 1.05 mg/dL). Liver Function Tests: Recent Labs  Lab 08/31/19 1523 09/01/19 1103 09/02/19 0210 09/03/19 0517  AST 359* 208* 110* 65*  ALT 610* 446* 292* 215*  ALKPHOS 169* 165* 139* 179*  BILITOT 3.5* 4.2* 4.1* 4.9*  PROT 7.8 6.6 5.5* 6.0*  ALBUMIN 4.1 3.6 2.8* 2.8*   Recent Labs  Lab 08/31/19 1523  LIPASE 23   No results for input(s): AMMONIA in the last 168 hours. Coagulation Profile: Recent Labs  Lab 08/31/19 1603  INR 1.1   Cardiac Enzymes: No results for input(s): CKTOTAL, CKMB, CKMBINDEX, TROPONINI in the last 168 hours. BNP (last 3 results) No results for input(s): PROBNP in the last 8760 hours. HbA1C: No results for input(s): HGBA1C in the last 72 hours. CBG: Recent Labs  Lab 09/02/19 1545  09/02/19 1958 09/02/19 2257 09/03/19 0321 09/03/19 0842  GLUCAP 155* 135* 74 90 100*   Lipid Profile: No results for input(s): CHOL, HDL, LDLCALC, TRIG, CHOLHDL, LDLDIRECT in the last 72 hours. Thyroid Function Tests: No results for input(s): TSH, T4TOTAL, FREET4, T3FREE, THYROIDAB in the last 72 hours. Anemia Panel: No results for input(s): VITAMINB12, FOLATE, FERRITIN, TIBC, IRON, RETICCTPCT in the last 72 hours. Sepsis Labs: Recent Labs  Lab 08/31/19 1603 08/31/19 1739  LATICACIDVEN 1.9 1.8    Recent Results (from the past 240 hour(s))  Blood Culture (routine x 2)     Status: None (Preliminary result)   Collection Time: 08/31/19  4:03 PM   Specimen: BLOOD RIGHT ARM  Result Value Ref Range Status   Specimen Description   Final     BLOOD RIGHT ARM BOTTLES DRAWN AEROBIC AND ANAEROBIC   Special Requests Blood Culture adequate volume  Final   Culture   Final    NO GROWTH 3 DAYS Performed at [facility], [address] [place], [place] [address]    Report Status PENDING  Incomplete  Blood Culture (routine x 2)     Status: None (Preliminary result)   Collection Time: 08/31/19  4:13 PM   Specimen: BLOOD RIGHT HAND  Result Value Ref Range Status   Specimen Description   Final    BLOOD RIGHT HAND BOTTLES DRAWN AEROBIC AND ANAEROBIC   Special Requests Blood Culture adequate volume  Final   Culture   Final    NO GROWTH 3 DAYS Performed at [facility], [address], [place], [place] [address]    Report Status PENDING  Incomplete  Urine culture     Status: Abnormal   Collection Time: 08/31/19  6:42 PM   Specimen: In/Out Cath Urine  Result Value Ref Range Status   Specimen Description   Final    IN/OUT CATH URINE Performed at [facility], [address], [place], [place] [address]    Special Requests   Final    NONE Performed at [facility], [address], [place], [place] [address]    Culture MULTIPLE SPECIES PRESENT, [place] (A)  Final   Report Status 09/02/2019 FINAL  Final  SARS Coronavirus 2 [facility] order, Performed in [facility] hospital lab) Nasopharyngeal Urine, Clean Catch     Status: None   Collection Time: 08/31/19  6:42 PM   Specimen: Urine, Clean Catch; Nasopharyngeal  Result Value Ref Range Status   SARS Coronavirus 2 NEGATIVE NEGATIVE Final    Comment: (NOTE) If result is NEGATIVE SARS-CoV-2 target nucleic acids are NOT DETECTED. The SARS-CoV-2 RNA is generally detectable in upper and lower  respiratory specimens during the acute phase of infection. The lowest  concentration of SARS-CoV-2 viral copies this assay can detect is 250  copies / mL. A negative result does not preclude SARS-CoV-2 infection  and should not be used as the sole basis for treatment or other  patient  management decisions.  A negative result may occur with  improper specimen collection / handling, submission of specimen other  than nasopharyngeal swab, presence of viral mutation(s) within the  areas targeted by this assay, and inadequate number of viral copies  (<250 copies / mL). A negative result must be combined with clinical  observations, patient history, and epidemiological information. If result is POSITIVE SARS-CoV-2 target nucleic acids are DETECTED. The SARS-CoV-2 RNA is generally detectable in upper and lower  respiratory specimens dur ing the acute phase of infection.  Positive  results are indicative of active infection with SARS-CoV-2.  Clinical  correlation with patient history and other diagnostic information is  necessary to determine patient infection status.  Positive results do  not rule out bacterial infection or co-infection with other viruses. If result is PRESUMPTIVE POSTIVE SARS-CoV-2 nucleic acids MAY BE PRESENT.   A presumptive positive result was obtained on the submitted specimen  and confirmed on repeat testing.  While 2019 novel coronavirus  (SARS-CoV-2) nucleic acids may be present in the submitted sample  additional confirmatory testing may be necessary for epidemiological  and / or clinical management purposes  to differentiate between  SARS-CoV-2 and other Sarbecovirus currently known to infect humans.  If clinically indicated additional testing with an alternate test  methodology [phone]) is advised. The SARS-CoV-2 RNA is generally  detectable in upper and lower respiratory sp ecimens during the acute  phase of infection. The expected result is Negative. Fact Sheet for Patients:  StrictlyIdeas.no Fact Sheet for Healthcare Providers: BankingDealers.co.za This test is not yet approved or cleared by the [place] FDA and has been authorized for detection and/or diagnosis of SARS-CoV-2 by FDA under  an Emergency Use Authorization (EUA).  This EUA will remain in effect (meaning this test can be used) for the duration of the COVID-19 declaration under Section 564(b)(1) of the Act, 21 U.S.C. section 360bbb-3(b)(1), unless the authorization is terminated or revoked sooner. Performed at [facility], [address], [place], [place] [address]   MRSA PCR Screening     Status: None   Collection Time: 08/31/19 11:08 PM   Specimen: Nasal Mucosa; Nasopharyngeal  Result Value Ref Range Status   MRSA by PCR NEGATIVE NEGATIVE Final    Comment:        The GeneXpert MRSA Assay (FDA approved for NASAL specimens only), is one component of a comprehensive MRSA colonization surveillance program. It is not intended to diagnose MRSA infection nor to guide or monitor treatment for MRSA infections. Performed at [facility], [place] [address] [place], [place] [address]   Surgical pcr screen     Status: None   Collection Time: 09/02/19  1:36 AM   Specimen: Nasal Mucosa; Nasal Swab  Result Value Ref Range Status   MRSA, PCR NEGATIVE NEGATIVE Final   Staphylococcus aureus NEGATIVE NEGATIVE Final    Comment: (NOTE) The Xpert SA Assay (FDA approved for NASAL specimens in patients 65 years of age and older), is one component of a comprehensive surveillance program. It is not intended to diagnose infection nor to guide or monitor treatment. Performed at [facility], [place] [address] [place], [place] [address]          Radiology Studies: Dg Ercp  Result Date: 09/02/2019 CLINICAL DATA:  73 year old male with biliary ductal dilatation and probable choledocholithiasis. EXAM: ERCP TECHNIQUE: Multiple spot images obtained with the fluoroscopic device and submitted for interpretation post-procedure. FLUOROSCOPY TIME:  Fluoroscopy Time:  3 minutes 57 seconds Radiation Exposure Index (if provided by the fluoroscopic device): 129 mGy COMPARISON:  None. FINDINGS: These  images were submitted for radiologic interpretation only. Please see the procedural report for the amount of contrast and the fluoroscopy time utilized. A total of 17 saved images and 3 cine clips are submitted for review. The images demonstrate a flexible endoscope in the descending duodenum with deep wire cannulation of the intrahepatic ducts followed by sphincterotomy, balloon dilation and balloon sweeping of the common bile duct. On the initial cholangiogram images, there is biliary ductal  dilatation and mobile filling defects in the distal common duct most consistent with choledocholithiasis. On the final image, a plastic biliary stent has been placed. IMPRESSION: 1. Choledocholithiasis. 2. ERCP with sphincterotomy, balloon sweep of the common duct and placement of plastic biliary stent. Electronically Signed   By: Jacqulynn Cadet M.D.   On: 09/02/2019 10:15        Scheduled Meds: . Chlorhexidine Gluconate Cloth  6 each Topical Daily  . diltiazem  240 mg Oral Q breakfast  . feeding supplement  1 Container Oral TID WC  . indomethacin  100 mg Rectal Once  . insulin aspart  0-9 Units Subcutaneous Q4H  . latanoprost  1 drop Both Eyes Q2200  . mouth rinse  15 mL Mouth Rinse BID  . pantoprazole (PROTONIX) IV  40 mg Intravenous QHS  . timolol  1 drop Both Eyes Daily   Continuous Infusions: . sodium chloride Stopped (09/02/19 1700)  . sodium chloride 75 mL/hr at 09/03/19 0033  . heparin 1,700 Units/hr (09/03/19 0548)  . lactated ringers    . piperacillin-tazobactam (ZOSYN)  IV 3.375 g (09/03/19 0523)     LOS: 3 days     Georgette Shell, MD [facility]  If 7PM-7AM, please contact night-coverage [email or web address] Password [email or web address] 09/03/2019, 9:21 AM

## 2019-09-03 NOTE — Progress Notes (Signed)
ANTICOAGULATION CONSULT NOTE - Follow Up Consult  Pharmacy Consult for Heparin Indication: atrial fibrillation  Allergies  Allergen Reactions  . Codeine Other (See Comments)    REACTION: Shakes   . Pred Forte [Prednisolone Acetate] Other (See Comments)    Severe burning    Patient Measurements: Height: 6\' 4"  (193 cm) Weight: 257 lb 15 oz (117 kg) IBW/kg (Calculated) : 86.8 Heparin Dosing Weight:   Vital Signs: Temp: 98.3 F (36.8 C) (09/09 1925) Temp Source: Oral (09/09 1925) BP: 100/50 (09/09 2033) Pulse Rate: 66 (09/09 2033)  Labs: Recent Labs    09/01/19 1102 09/01/19 1103  09/02/19 0210 09/03/19 0517 09/03/19 1346 09/03/19 2107  HGB 13.7  --   --   --  12.1*  --   --   HCT 42.8  --   --   --  35.7*  --   --   PLT 132*  --   --   --  94*  --   --   HEPARINUNFRC  --  0.31   < >  --  0.14* 0.21* 0.30  CREATININE  --  1.43*  --  1.00 1.05  --   --    < > = values in this interval not displayed.    Estimated Creatinine Clearance: 89 mL/min (by C-G formula based on SCr of 1.05 mg/dL).   Medications:  Infusions:  . sodium chloride Stopped (09/02/19 1700)  . heparin 1,950 Units/hr (09/03/19 1525)  . piperacillin-tazobactam (ZOSYN)  IV 3.375 g (09/03/19 2148)    Assessment: Patient with heparin level at goal but lowest level of goal.  No heparin issues per RN.  Goal of Therapy:  Heparin level 0.3-0.7 units/ml Monitor platelets by anticoagulation protocol: Yes   Plan:  Increase heparin to 2050 units/hr Recheck level at 0800  Tyler Deis, Shea Stakes [place] 09/03/2019,10:10 PM

## 2019-09-03 NOTE — Consult Note (Signed)
CARDIOLOGY CONSULT NOTE  Patient ID: [name] MRN: [id number] DOB/AGE: 72/14/1947 72 y.o.  Admit date: 08/31/2019 Referring Physician Jacki Cones, MD Primary Physician:  Sinda Du, MD Reason for Consultation preoperative cardiovascular stratification and atrial fibrillation follow-up  HPI:    [name]  is a 73 y.o. male  with permanent atrial fibrillation diagnosed sometime in 2012, COPD, ongoing tobacco use disorder, degenerative disc disease and also bilateral hip disease, diabetes mellitus type 2 controlled without complications, hypertension and hyperlipidemia admitted to the hospital with acute cholecystitis presenting with nausea, vomiting and abdominal discomfort.  With hydration, antibiotic therapy, symptoms improved, he is now tolerating clear liquid diet.  He has also had small bowel movements.  With regard to atrial fibrillation, patient states that he has not been on anticoagulation.  He was seeing a cardiologist greater than a decade ago but states that his cardiologist retired.  Patient states that he is markedly sedentary.  Has chronic dyspnea and mild cough and attributes it to smoking.  States that he had quit smoking and had gained significant amount of weight and hence started to smoke again.  Denies any chest pain, palpitations, dizziness or syncope.  He does complain of bilateral calf pain and also thigh pain with activity and also at night.  No rest pain.  No ulceration in his legs.  Past Medical History:  Diagnosis Date  . Atrial fibrillation ([place])   . Avascular necrosis of bones of both hips ([place])   . 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 Cancer ([place])   . Colonic polyp   . Complication of anesthesia    hard to wake up, "felt like died and revived me"  . COPD (chronic obstructive pulmonary disease) (HCC)    mild PFT abnormalities; normal ABG  . Degenerative disc disease, lumbar    HNP of the LS spine  . Diabetes mellitus    type 2  . GERD  (gastroesophageal reflux disease)   . Glaucoma   . H/O hiatal hernia   . Hx MRSA infection 2010   sith splenic abcess  . Non Hodgkin's lymphoma ([place]) 06/2009   chemotherapy until 11/2010  . Obesity   . Obstructive sleep apnea    treated with CPAP  . Orthostatic hypotension    lightheadness; no definate loss of consciousness  . Pedal edema   . PONV (postoperative nausea and vomiting)   . Splenic abscess 07/2009  . Tobacco abuse    45 pack years    Past Surgical History:  Procedure Laterality Date  . ABSCESS DRAINAGE  2010   Splenic  . ANAL FISSURE 74  73years old  . CATARACT EXTRACTION     Right with lens implant  . COLONOSCOPY W/ POLYPECTOMY  2008   with snare polypectomy  . EYE SURGERY     lens implant  . INGUINAL HERNIA REPAIR Left 03/09/2017   Procedure: OPEN REPAIR LEFT INGUINAL HERNIA;  Surgeon: Arta Bruce Kinsinger, MD;  Location: [facility];  Service: General;  Laterality: Left;  . INSERTION OF MESH Left 03/09/2017   Procedure: INSERTION OF MESH;  Surgeon: Arta Bruce Kinsinger, MD;  Location: [facility];  Service: General;  Laterality: Left;  . KNEE ARTHROSCOPY     right  . LYMPH NODE BIOPSY  2010   done x 2 for NHL diagnosis  . TONSILLECTOMY    . TOTAL HIP ARTHROPLASTY  12/13/2012   Procedure: TOTAL HIP ARTHROPLASTY ANTERIOR APPROACH;  Surgeon: Mcarthur Rossetti, MD;  Location: [facility];  Service: Orthopedics;  Laterality: Left;  Left Total Hip Arthroplasty, Anterior Approach (C-Arm)    Social History   Socioeconomic History  . Marital status: Divorced    Spouse name: Not on file  . Number of children: Not on file  . Years of education: Not on file  . Highest education level: Not on file  Occupational History  . Not on file  Social Needs  . Financial resource strain: Not on file  . Food insecurity    Worry: Not on file    Inability: Not on file  . Transportation needs    Medical: Not on file    Non-medical: Not on file  Tobacco Use  . Smoking status:  Current Every Day Smoker    Packs/day: 1.00    Years: 55.00    Pack years: 55.00    Types: Cigarettes  . Smokeless tobacco: Never Used  Substance and Sexual Activity  . Alcohol use: No  . Drug use: No  . Sexual activity: Not on file  Lifestyle  . Physical activity    Days per week: Not on file    Minutes per session: Not on file  . Stress: Not on file  Relationships  . Social Herbalist on phone: Not on file    Gets together: Not on file    Attends religious service: Not on file    Active member of club or organization: Not on file    Attends meetings of clubs or organizations: Not on file    Relationship status: Not on file  . Intimate partner violence    Fear of current or ex partner: Not on file    Emotionally abused: Not on file    Physically abused: Not on file    Forced sexual activity: Not on file  Other Topics Concern  . Not on file  Social History Narrative  . Not on file     09/08 0701 - 09/09 0700 In: 1276.3 [P.O.:20; I.V.:865.4; IV Piggyback:390.8] Out: 300 [Urine:300]  No intake/output data recorded.   ROS  Review of Systems  Constitution: Positive for malaise/fatigue. Negative for chills, decreased appetite and weight gain.  Cardiovascular: Positive for dyspnea on exertion and leg swelling. Negative for chest pain, irregular heartbeat and syncope.  Respiratory: Positive for shortness of breath.   Endocrine: Negative for cold intolerance.  Hematologic/Lymphatic: Does not bruise/bleed easily.  Musculoskeletal: Positive for arthritis, back pain, joint pain and muscle cramps. Negative for joint swelling.  Gastrointestinal: Positive for abdominal pain and nausea. Negative for anorexia, change in bowel habit, diarrhea, hematochezia and melena.  Neurological: Negative for headaches and light-headedness.  Psychiatric/Behavioral: Negative for depression and substance abuse.  All other systems reviewed and are negative.   Objective  Blood pressure  (!) 93/59, pulse 71, temperature 98.3 F (36.8 C), temperature source Oral, resp. rate 15, height 6\' 4"  (1.93 m), weight 117 kg, SpO2 100 %. Body mass index is 31.4 kg/m. Vitals with BMI 09/03/2019 09/03/2019 09/03/2019  Height - - -  Weight - - -  BMI - - -  Systolic 93 - 0000000  Diastolic 59 - 76  Pulse 71 69 92    Blood pressure (!) 93/59, pulse 71, temperature 98.3 F (36.8 C), temperature source Oral, resp. rate 15, height 6\' 4"  (1.93 m), weight 117 kg, SpO2 100 %. Body mass index is 31.4 kg/m.  Physical Exam  Constitutional: No distress.  Well-built and mildly obese in no acute distress  HENT:  Head: Atraumatic.  Eyes: Conjunctivae are normal.  Neck: Neck supple. No JVD present. No thyromegaly present.  Cardiovascular: Intact distal pulses. An irregularly irregular rhythm present. Exam reveals no gallop, no S3 and no S4.  No murmur heard. Pulses:      Carotid pulses are 2+ on the right side and 2+ on the left side.      Femoral pulses are 1+ on the right side and 1+ on the left side.      Popliteal pulses are 1+ on the right side and 1+ on the left side.       Dorsalis pedis pulses are 0 on the right side and 0 on the left side.       Posterior tibial pulses are 0 on the right side and 0 on the left side.  S1 is variable, S2 is normal. No leg edema, no JVD.   Pulmonary/Chest: Effort normal and breath sounds normal.  Abdominal: Soft. Bowel sounds are normal.  Musculoskeletal: Normal range of motion.        General: No edema.  Neurological: He is alert.  Skin: Skin is warm and dry.  Psychiatric: He has a normal mood and affect.    Radiology: Dg Ercp  Result Date: 09/02/2019 CLINICAL DATA:  73 year old male with biliary ductal dilatation and probable choledocholithiasis. EXAM: ERCP TECHNIQUE: Multiple spot images obtained with the fluoroscopic device and submitted for interpretation post-procedure. FLUOROSCOPY TIME:  Fluoroscopy Time:  3 minutes 57 seconds Radiation Exposure Index  (if provided by the fluoroscopic device): 129 mGy COMPARISON:  None. FINDINGS: These images were submitted for radiologic interpretation only. Please see the procedural report for the amount of contrast and the fluoroscopy time utilized. A total of 17 saved images and 3 cine clips are submitted for review. The images demonstrate a flexible endoscope in the descending duodenum with deep wire cannulation of the intrahepatic ducts followed by sphincterotomy, balloon dilation and balloon sweeping of the common bile duct. On the initial cholangiogram images, there is biliary ductal dilatation and mobile filling defects in the distal common duct most consistent with choledocholithiasis. On the final image, a plastic biliary stent has been placed. IMPRESSION: 1. Choledocholithiasis. 2. ERCP with sphincterotomy, balloon sweep of the common duct and placement of plastic biliary stent. Electronically Signed   By: Jacqulynn Cadet M.D.   On: 09/02/2019 10:15   Dg Abd 2 Views  Result Date: 09/03/2019 CLINICAL DATA:  Epigastric pain, check biliary stent EXAM: ABDOMEN - 2 VIEW COMPARISON:  09/02/2019 FINDINGS: Scattered large and small bowel gas is noted. Biliary stent is again seen and stable. No free air is noted. No obstructive changes are noted. 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 IMPRESSION: Biliary stent in place.  No acute abnormality noted. Electronically Signed   By: Inez Catalina M.D.   On: 09/03/2019 14:25    Laboratory examination:   Recent Labs    09/01/19 1103 09/02/19 0210 09/03/19 0517  NA 139 135 137  K 3.7 3.2* 3.3*  CL 105 105 105  CO2 24 23 23   GLUCOSE 112* 117* 97  BUN 23 17 19   CREATININE 1.43* 1.00 1.05  CALCIUM 8.4* 7.6* 7.8*  GFRNONAA 49* >60 >60  GFRAA 56* >60 >60   CMP Latest Ref Rng & Units 09/03/2019 09/02/2019 09/01/2019  Glucose 70 - 99 mg/dL 97 117(H) 112(H)  BUN 8 - 23 mg/dL 19 17 23   Creatinine 0.61 - 1.24 mg/dL 1.05 1.00 1.43(H)  Sodium 135 - 145 mmol/L 137  135 139  Potassium  3.5 - 5.1 mmol/L 3.3(L) 3.2(L) 3.7  Chloride 98 - 111 mmol/L 105 105 105  CO2 22 - 32 mmol/L 23 23 24   Calcium 8.9 - 10.3 mg/dL 7.8(L) 7.6(L) 8.4(L)  Total Protein 6.5 - 8.1 g/dL 6.0(L) 5.5(L) 6.6  Total Bilirubin 0.3 - 1.2 mg/dL 4.9(H) 4.1(H) 4.2(H)  Alkaline Phos 38 - 126 U/L 179(H) 139(H) 165(H)  AST 15 - 41 U/L 65(H) 110(H) 208(H)  ALT 0 - 44 U/L 215(H) 292(H) 446(H)   CBC Latest Ref Rng & Units 09/03/2019 09/01/2019 08/31/2019  WBC 4.0 - 10.5 K/uL 9.1 14.2(H) 14.0(H)  Hemoglobin 13.0 - 17.0 g/dL 12.1(L) 13.7 16.1  Hematocrit 39.0 - 52.0 % 35.7(L) 42.8 48.2  Platelets 150 - 400 K/uL 94(L) 132(L) 175   Lipid Panel     Component Value Date/Time   CHOL (H) 07/17/2009 1844    204        ATP III CLASSIFICATION:  <200     mg/dL   Desirable  200-239  mg/dL   Borderline High  >=240    mg/dL   High          TRIG 151 (H) 07/17/2009 1844   HDL 26 (L) 07/17/2009 1844   CHOLHDL 7.8 07/17/2009 1844   VLDL 30 07/17/2009 1844   LDLCALC (H) 07/17/2009 1844    148        Total Cholesterol/HDL:CHD Risk Coronary Heart Disease Risk Table                     Men   Women  1/2 Average Risk   3.4   3.3  Average Risk       5.0   4.4  2 X Average Risk   9.6   7.1  3 X Average Risk  23.4   11.0        Use the calculated Patient Ratio above and the CHD Risk Table to determine the patient's CHD Risk.        ATP III CLASSIFICATION (LDL):  <100     mg/dL   Optimal  100-129  mg/dL   Near or Above                    Optimal  130-159  mg/dL   Borderline  160-189  mg/dL   High  >190     mg/dL   Very High   HEMOGLOBIN A1C Lab Results  Component Value Date   HGBA1C 5.7 (H) 03/08/2017   MPG 117 03/08/2017   TSH Recent Labs    09/03/19 1346  TSH 3.066   Medications   Prior to Admission medications   Medication Sig Start Date End Date Taking? Authorizing Provider  aspirin EC 325 MG tablet Take 1 tablet (325 mg total) by mouth daily. 12/17/12  Yes Mcarthur Rossetti, MD  atorvastatin  (LIPITOR) 20 MG tablet Take 20 mg by mouth daily. 05/08/19  Yes [provider]  diltiazem (CARDIZEM CD) 240 MG 24 hr capsule Take 240 mg by mouth daily with breakfast. 12/04/16  Yes [provider]  ipratropium-albuterol (DUONEB) 0.5-2.5 (3) MG/3ML SOLN Take 3 mLs by nebulization every 6 (six) hours as needed (for wheezing/shortness of breath).   Yes [provider]  latanoprost (XALATAN) 0.005 % ophthalmic solution Place 1 drop into both eyes daily at 10 pm. 12/04/16  Yes [provider]  metFORMIN (GLUCOPHAGE) 500 MG tablet Take 500 mg by mouth 2 (two) times  daily.   Yes [provider]  methocarbamol (ROBAXIN) 500 MG tablet Take 1 tablet (500 mg total) by mouth 2 (two) times daily. 08/10/18  Yes Jacqlyn Larsen, PA-C  senna (SENOKOT) 8.6 MG tablet Take 1-2 tablets by mouth daily as needed for constipation. Constipation   Yes [provider]  timolol (TIMOPTIC) 0.5 % ophthalmic solution Place 1 drop into both eyes daily. 01/12/17  Yes [provider]     Current Outpatient Medications  Medication Instructions  . aspirin EC 325 mg, Oral, Daily  . atorvastatin (LIPITOR) 20 mg, Oral, Daily  . diltiazem (CARDIZEM CD) 240 mg, Oral, Daily with breakfast  . ipratropium-albuterol (DUONEB) 0.5-2.5 (3) MG/3ML SOLN 3 mLs, Nebulization, Every 6 hours PRN  . latanoprost (XALATAN) 0.005 % ophthalmic solution 1 drop, Both Eyes, Daily at 10 pm  . metFORMIN (GLUCOPHAGE) 500 mg, 2 times daily  . methocarbamol (ROBAXIN) 500 mg, Oral, 2 times daily  . senna (SENOKOT) 8.6 MG tablet 1-2 tablets, Oral, Daily PRN, Constipation  . timolol (TIMOPTIC) 0.5 % ophthalmic solution 1 drop, Both Eyes, Daily    Cardiac Studies:   Echocardiogram 09/01/2019 :  1. The left ventricle has low normal systolic function, with an ejection fraction of 55-60%. The cavity size was normal. There is moderately increased left ventricular wall thickness. Left ventricular  diastolic function could not be evaluated secondary  to atrial fibrillation. Inadequat for regional wall motion assessment.  2. The right ventricle has normal systolic function. The cavity was normal. There is no increase in right ventricular wall thickness.  3. Moderate thickening of the mitral valve leaflet. Mild calcification of the mitral valve leaflet. There is moderate mitral annular calcification present.  4. The aortic valve is tricuspid. Mild sclerosis of the aortic valve. Aortic valve regurgitation was not assessed by color flow Doppler.  5. The aorta is normal unless otherwise noted.  6. The interatrial septum appears to be lipomatous.  7. Trivial pericardial effusion is present.  8. The pericardial effusion is circumferential.  Assessment   1.  Preoperative cardiovascular stratification 2.  Acute cholecystitis 3.  Permanent atrial fibrillation CHA2DS2-VASc Score is 4.  Yearly risk of stroke: 4%.  Score of 1=1.3; 2=2.2; 3=3.2; 4=4; 5=6.7; 6=9.8; 7=>9.8) -(CHF; HTN; vasc disease DM,  Male = 1; Age <65 =0; 65-74 = 1,  >75 =2; stroke = 2).   4.  Essential hypertension 5.  Hyperlipidemia 6.  Peripheral arterial disease 7.  Tobacco use disorder  EKG 08/31/2019: Atrial fibrillation with controlled ventricular response at the rate of 100 bpm, normal axis, nonspecific T abnormality.  No significant change from prior EKG.  Recommendations:   Patient with multiple cardiovascular risk factors, has not had any cardiac evaluation in quite a while, will need Lexiscan Myoview stress test prior to surgery.  I will schedule this for tomorrow if schedule permits.  It appears that surgery has not been scheduled yet.  If patient is deemed ready for discharge then it can be done in the outpatient basis otherwise I can try to set this up for either tomorrow or day after tomorrow. With regard to atrial fibrillation and hypertension, he has borderline low blood pressure due to sepsis, heart rate is  well controlled.  He remains asymptomatic with atrial fibrillation.  If discharged home, he should be on anticoagulation with either Eliquis 5 mg p.o. twice daily or Xarelto 20 mg nightly after dinner.  I discussed with the patient regarding tobacco use disorder.  Hopefully he will  continue to try his best to quit, states that he had gained significant amount of weight and was weighing greater than 300 to 350 pounds when he quit smoking hence took back to smoking.  Has peripheral arterial disease with symptoms of claudication in bilateral calves and his femoral pulses are also feeble.  No critical limb ischemia on exam, this can be worked up in the outpatient basis again.  Adrian Prows, MD, [facility] 09/03/2019, 8:31 PM [facility] [place] Pager: [phone] Office: [phone] If no answer Cell [phone]

## 2019-09-03 NOTE — Progress Notes (Signed)
ANTICOAGULATION CONSULT NOTE - Follow Up Consult  Pharmacy Consult for Heparin Indication: atrial fibrillation  Allergies  Allergen Reactions  . Codeine Other (See Comments)    REACTION: Shakes   . Pred Forte [Prednisolone Acetate] Other (See Comments)    Severe burning    Patient Measurements: Height: 6\' 4"  (193 cm) Weight: 257 lb 15 oz (117 kg) IBW/kg (Calculated) : 86.8 Heparin Dosing Weight:   Vital Signs: Temp: 97.7 F (36.5 C) (09/09 0359) Temp Source: Oral (09/09 0359) BP: 122/78 (09/09 0500) Pulse Rate: 68 (09/09 0500)  Labs: Recent Labs    08/31/19 1523 08/31/19 1603 08/31/19 2029 09/01/19 1102 09/01/19 1103 09/01/19 1837 09/02/19 0210 09/03/19 0517  HGB 16.1  --   --  13.7  --   --   --  12.1*  HCT 48.2  --   --  42.8  --   --   --  35.7*  PLT 175  --   --  132*  --   --   --  94*  APTT  --  30  --   --   --   --   --   --   LABPROT  --  14.0  --   --   --   --   --   --   INR  --  1.1  --   --   --   --   --   --   HEPARINUNFRC  --   --   --   --  0.31 0.24*  --  0.14*  CREATININE 1.31*  --   --   --  1.43*  --  1.00 1.05  TROPONINIHS  --   --  11  --   --   --   --   --     Estimated Creatinine Clearance: 89 mL/min (by C-G formula based on SCr of 1.05 mg/dL).   Medications:  Infusions:  . sodium chloride Stopped (09/02/19 1700)  . sodium chloride 75 mL/hr at 09/03/19 0033  . heparin 1,600 Units/hr (09/03/19 0300)  . lactated ringers    . piperacillin-tazobactam (ZOSYN)  IV 3.375 g (09/03/19 0523)    Assessment: Patient with low heparin level.  No heparin issues per RN.  Goal of Therapy:  Heparin level 0.3-0.7 units/ml Monitor platelets by anticoagulation protocol: Yes   Plan:  Increase heparin to 1700 units/hr Recheck level at [place], Jammi Morrissette Crowford 09/03/2019,5:48 AM

## 2019-09-03 NOTE — TOC Benefit Eligibility Note (Signed)
Transition of Care [facility]) Benefit Eligibility Note    Patient Details  Name: [name] MRN: [id number] Date of Birth: 10/15/1946   Medication/Dose: Xarelto 79m's 30 tabs, and Eliquis 517mbid 60 tabs generic not on formulary  Covered?: Yes  Tier: 3 Drug  Prescription Coverage Preferred Pharmacy: local  Spoke with Person/Company/Phone Number:: Jen/ [employer] [phone]-Pay: both Xarelto and Eliquis are $54.11  Prior Approval: No  Deductible: Met       FuKerin [place]one Number: 09/03/2019, 10:12 AM

## 2019-09-03 NOTE — Progress Notes (Signed)
ANTICOAGULATION CONSULT NOTE - Follow Up Consult  Pharmacy Consult for heparin Indication: atrial fibrillation  Allergies  Allergen Reactions  . Codeine Other (See Comments)    REACTION: Shakes   . Pred Forte [Prednisolone Acetate] Other (See Comments)    Severe burning   Patient Measurements: Height: 6\' 4"  (193 cm) Weight: 257 lb 15 oz (117 kg) IBW/kg (Calculated) : 86.8 Heparin Dosing Weight: 109 kg  Vital Signs: Temp: 97.9 F (36.6 C) (09/09 0746) Temp Source: Oral (09/09 0746) BP: 125/76 (09/09 1000) Pulse Rate: 92 (09/09 1000)  Labs: Recent Labs    08/31/19 1523 08/31/19 1603 08/31/19 2029 09/01/19 1102  09/01/19 1103 09/01/19 1837 09/02/19 0210 09/03/19 0517 09/03/19 1346  HGB 16.1  --   --  13.7  --   --   --   --  12.1*  --   HCT 48.2  --   --  42.8  --   --   --   --  35.7*  --   PLT 175  --   --  132*  --   --   --   --  94*  --   APTT  --  30  --   --   --   --   --   --   --   --   LABPROT  --  14.0  --   --   --   --   --   --   --   --   INR  --  1.1  --   --   --   --   --   --   --   --   HEPARINUNFRC  --   --   --   --    < > 0.31 0.24*  --  0.14* 0.21*  CREATININE 1.31*  --   --   --   --  1.43*  --  1.00 1.05  --   TROPONINIHS  --   --  11  --   --   --   --   --   --   --    < > = values in this interval not displayed.   Estimated Creatinine Clearance: 89 mL/min (by C-G formula based on SCr of 1.05 mg/dL).  Medical History: Past Medical History:  Diagnosis Date  . Atrial fibrillation ([place])   . Avascular necrosis of bones of both hips ([place])   . 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 Cancer ([place])   . Colonic polyp   . Complication of anesthesia    hard to wake up, "felt like died and revived me"  . COPD (chronic obstructive pulmonary disease) (HCC)    mild PFT abnormalities; normal ABG  . Degenerative disc disease, lumbar    HNP of the LS spine  . Diabetes mellitus    type 2  . GERD (gastroesophageal reflux disease)   . Glaucoma    . H/O hiatal hernia   . Hx MRSA infection 2010   sith splenic abcess  . Non Hodgkin's lymphoma ([place]) 06/2009   chemotherapy until 11/2010  . Obesity   . Obstructive sleep apnea    treated with CPAP  . Orthostatic hypotension    lightheadness; no definate loss of consciousness  . Pedal edema   . PONV (postoperative nausea and vomiting)   . Splenic abscess 07/2009  . Tobacco abuse    45 pack years    Assessment: Patient  admitted with abdominal pain - found to have CBD stones and biliary obstruction. Heparin drip initiated on admission for atrial fibrillation -  patient was not taking anticoagulants PTA with hx of Afib  Baseline labs:  Hgb 16.1,Plt 175, INR 1.1, APTT 30 seconds  9/7 Heparin 4000 unit bolus, infusion at 1400 units/hr 1100 Hep level = 0.31 units/ml, in therapeutic range 1837 Hep level = 0.24, rate increased to 1600 units/hr,off at 1:30am 9/8 for ERCP 9/8: Heparin resumed 8pm at 1600 units/hr  Today, 09/03/19 0517 Hep level = 0.14 units/ml on 1600 units/hr, rate to 1700 units/hr 1346 Hep level - 0.21 units/mlr  Goal of Therapy:  Heparin level 0.3-0.7 units/ml Monitor platelets by anticoagulation protocol: Yes   Plan:  Increase Heparin to 1950 units/hr Check Hep level in 6 hr ~ 2100 Daily CBC and Hep level Monitor for signs/symptoms of bleeding or thrombosis  Minda Ditto, PharmD 09/03/2019,2:48 PM

## 2019-09-03 NOTE — Progress Notes (Addendum)
[facility] Progress Note  1 Day Post-Op  Subjective: CC-  Feeling better today since ERCP yesterday. Abdomen a little sore but pain improved. Denies n/v. States that he is hungry. WBC down 9.1, afebrile. LFTs slightly up.  Patient was not on anticoagulation prior to admission, but he was started in IV heparin last night. States that he lives home alone. Uses cane or walker PRN for ambulation. He does report SOB with walking to his mailbox. Mostly fairly sedentary during the day.  Objective: Vital signs in last 24 hours: Temp:  [97.4 F (36.3 C)-98 F (36.7 C)] 97.7 F (36.5 C) (09/09 0359) Pulse Rate:  [61-99] 68 (09/09 0500) Resp:  [8-23] 15 (09/09 0500) BP: (86-143)/(46-84) 122/78 (09/09 0500) SpO2:  [92 %-99 %] 93 % (09/09 0500) Weight:  WG:[id number] kg] 117 kg (09/09 0500) Last BM Date: 09/02/19  Intake/Output from previous day: 09/08 0701 - 09/09 0700 In: 1276.3 [P.O.:20; I.V.:865.4; IV Piggyback:390.8] Out: 300 [Urine:300] Intake/Output this shift: No intake/output data recorded.  PE: Gen:  Alert, NAD, pleasant HEENT: EOM's intact, pupils equal and round Card:  RRR Pulm:  CTAB, no W/R/R, effort normal on CPAPA Abd: Soft, mild distension, mild upper abdominal tenderness, +BS, no HSM Ext:  Calves soft and nontender Psych: A&Ox3  Skin: no rashes noted, warm and dry  Lab Results:  Recent Labs    09/01/19 1102 09/03/19 0517  WBC 14.2* 9.1  HGB 13.7 12.1*  HCT 42.8 35.7*  PLT 132* 94*   BMET Recent Labs    09/02/19 0210 09/03/19 0517  NA 135 137  K 3.2* 3.3*  CL 105 105  CO2 23 23  GLUCOSE 117* 97  BUN 17 19  CREATININE 1.00 1.05  CALCIUM 7.6* 7.8*   PT/INR Recent Labs    08/31/19 1603  LABPROT 14.0  INR 1.1   CMP     Component Value Date/Time   NA 137 09/03/2019 0517   NA 139 11/14/2011 0919   K 3.3 (L) 09/03/2019 0517   K 4.4 11/14/2011 0919   CL 105 09/03/2019 0517   CL 102 11/14/2011 0919   CO2 23 09/03/2019 0517   CO2 28  11/14/2011 0919   GLUCOSE 97 09/03/2019 0517   GLUCOSE 100 11/14/2011 0919   BUN 19 09/03/2019 0517   BUN 17 11/14/2011 0919   CREATININE 1.05 09/03/2019 0517   CREATININE 0.9 11/14/2011 0919   CALCIUM 7.8 (L) 09/03/2019 0517   CALCIUM 8.6 11/14/2011 0919   PROT 6.0 (L) 09/03/2019 0517   PROT 7.1 11/14/2011 0919   ALBUMIN 2.8 (L) 09/03/2019 0517   ALBUMIN 4.0 11/14/2011 0919   AST 65 (H) 09/03/2019 0517   AST 22 11/14/2011 0919   ALT 215 (H) 09/03/2019 0517   ALT 23 11/14/2011 0919   ALKPHOS 179 (H) 09/03/2019 0517   ALKPHOS 55 11/14/2011 0919   BILITOT 4.9 (H) 09/03/2019 0517   BILITOT 0.50 11/14/2011 0919   GFRNONAA >60 09/03/2019 0517   GFRAA >60 09/03/2019 0517   Lipase     Component Value Date/Time   LIPASE 23 08/31/2019 1523       Studies/Results: Dg Ercp  Result Date: 09/02/2019 CLINICAL DATA:  73 year old male with biliary ductal dilatation and probable choledocholithiasis. EXAM: ERCP TECHNIQUE: Multiple spot images obtained with the fluoroscopic device and submitted for interpretation post-procedure. FLUOROSCOPY TIME:  Fluoroscopy Time:  3 minutes 57 seconds Radiation Exposure Index (if provided by the fluoroscopic device): 129 mGy COMPARISON:  None. FINDINGS: These images were  submitted for radiologic interpretation only. Please see the procedural report for the amount of contrast and the fluoroscopy time utilized. A total of 17 saved images and 3 cine clips are submitted for review. The images demonstrate a flexible endoscope in the descending duodenum with deep wire cannulation of the intrahepatic ducts followed by sphincterotomy, balloon dilation and balloon sweeping of the common bile duct. On the initial cholangiogram images, there is biliary ductal dilatation and mobile filling defects in the distal common duct most consistent with choledocholithiasis. On the final image, a plastic biliary stent has been placed. IMPRESSION: 1. Choledocholithiasis. 2. ERCP with  sphincterotomy, balloon sweep of the common duct and placement of plastic biliary stent. Electronically Signed   By: Jacqulynn Cadet M.D.   On: 09/02/2019 10:15    Anti-infectives: Anti-infectives (From admission, onward)   Start     Dose/Rate Route Frequency Ordered Stop   09/01/19 0700  vancomycin (VANCOCIN) IVPB 750 mg/150 ml premix  Status:  Discontinued     750 mg 150 mL/hr over 60 Minutes Intravenous Every 12 hours 08/31/19 2056 09/01/19 1016   08/31/19 2200  piperacillin-tazobactam (ZOSYN) IVPB 3.375 g     3.375 g 12.5 mL/hr over 240 Minutes Intravenous Every 8 hours 08/31/19 2055     08/31/19 2100  vancomycin (VANCOCIN) IVPB 1000 mg/200 mL premix  Status:  Discontinued     1,000 mg 200 mL/hr over 60 Minutes Intravenous Every 1 hr x 2 08/31/19 2055 08/31/19 2259   08/31/19 1545  cefTRIAXone (ROCEPHIN) 2 g in sodium chloride 0.9 % 100 mL IVPB     2 g 200 mL/hr over 30 Minutes Intravenous  Once 08/31/19 1541 08/31/19 1631   08/31/19 1545  metroNIDAZOLE (FLAGYL) IVPB 500 mg     500 mg 100 mL/hr over 60 Minutes Intravenous  Once 08/31/19 1541 08/31/19 1650       Assessment/Plan Hx of NHL - has not had follow up in several years Paroxysmal A Fib - started on IV heparin 9/8, not on AC prior to admission COPD on 2L prn OSA on CPAP DM HTN  Cholangitis Choledocholithiasis Cholecystitis - unable to visualize cystic duct with cholangiogram during ERCP - S/p ERCP 9/8: had sphincterotomy, balloon dilation, stone extraction, stent placement with drainage of bile / puss  - Patient overall improving since ERCP yesterday. Agree with continuing antibiotics, full liquids per GI. Trend LFTs. Patient may require either cholecystectomy or percutaneous cholecystostomy tube placement during this admission. Discussed with MD, will see how he does in the early phase after ERCP. If he improves from cholangitis we may be able to discharge him and follow up outpatient to discuss elective  cholecystectomy. If he does not improve, would likely require perc chole this admission.  He does have MMP including a fib and COPD with home O2 requirement. Prior to undergoing general anesthesia he will need medical clearance.   ID - Zosyn 9/6>> VTE - SCDs, IV heparin FEN - IVF, FLD Foley - none Follow up - TBD   LOS: 3 days    Wellington Hampshire , [facility] 09/03/2019, 8:40 AM Pager: [phone] Mon-Thurs 7:00 am-4:30 pm Fri 7:00 am -11:30 AM Sat-Sun 7:00 am-11:30 am

## 2019-09-04 ENCOUNTER — Telehealth: Payer: Self-pay

## 2019-09-04 ENCOUNTER — Ambulatory Visit (HOSPITAL_COMMUNITY)
Admission: RE | Admit: 2019-09-04 | Discharge: 2019-09-04 | Disposition: A | Discharge status: Home / Self Care | Payer: Medicare Other | Source: Ambulatory Visit | Attending: Cardiology | Admitting: Cardiology

## 2019-09-04 ENCOUNTER — Encounter (HOSPITAL_COMMUNITY): Payer: Self-pay

## 2019-09-04 DIAGNOSIS — Z01810 Encounter for preprocedural cardiovascular examination: Secondary | ICD-10-CM | POA: Insufficient documentation

## 2019-09-04 DIAGNOSIS — K831 Obstruction of bile duct: Secondary | ICD-10-CM

## 2019-09-04 DIAGNOSIS — R9439 Abnormal result of other cardiovascular function study: Secondary | ICD-10-CM | POA: Insufficient documentation

## 2019-09-04 DIAGNOSIS — I4891 Unspecified atrial fibrillation: Secondary | ICD-10-CM | POA: Diagnosis not present

## 2019-09-04 LAB — GLUCOSE, CAPILLARY
Glucose-Capillary: 87 mg/dL (ref 70–99)
Glucose-Capillary: 104 mg/dL — ABNORMAL HIGH (ref 70–99)
Glucose-Capillary: 109 mg/dL — ABNORMAL HIGH (ref 70–99)
Glucose-Capillary: 106 mg/dL — ABNORMAL HIGH (ref 70–99)
Glucose-Capillary: 122 mg/dL — ABNORMAL HIGH (ref 70–99)
Glucose-Capillary: 132 mg/dL — ABNORMAL HIGH (ref 70–99)

## 2019-09-04 LAB — COMPREHENSIVE METABOLIC PANEL
ALT: 180 U/L — ABNORMAL HIGH (ref 0–44)
AST: 60 U/L — ABNORMAL HIGH (ref 15–41)
Albumin: 2.8 g/dL — ABNORMAL LOW (ref 3.5–5.0)
Alkaline Phosphatase: 194 U/L — ABNORMAL HIGH (ref 38–126)
Anion gap: 7 (ref 5–15)
BUN: 13 mg/dL (ref 8–23)
CO2: 23 mmol/L (ref 22–32)
Calcium: 7.9 mg/dL — ABNORMAL LOW (ref 8.9–10.3)
Chloride: 102 mmol/L (ref 98–111)
Creatinine, Ser: 1.05 mg/dL (ref 0.61–1.24)
GFR calc Af Amer: >60 mL/min (ref >60)
GFR calc non Af Amer: >60 mL/min (ref >60)
Glucose, Bld: 95 mg/dL (ref 70–99)
Potassium: 3.7 mmol/L (ref 3.5–5.1)
Sodium: 132 mmol/L — ABNORMAL LOW (ref 135–145)
Total Bilirubin: 3.8 mg/dL — ABNORMAL HIGH (ref 0.3–1.2)
Total Protein: 6 g/dL — ABNORMAL LOW (ref 6.5–8.1)

## 2019-09-04 LAB — CBC
HCT: 35.4 % — ABNORMAL LOW (ref 39.0–52.0)
Hemoglobin: 11.6 g/dL — ABNORMAL LOW (ref 13.0–17.0)
MCH: 30.9 pg (ref 26.0–34.0)
MCHC: 32.8 g/dL (ref 30.0–36.0)
MCV: 94.4 fL (ref 80.0–100.0)
Platelets: 129 10*3/uL — ABNORMAL LOW (ref 150–400)
RBC: 3.75 MIL/uL — ABNORMAL LOW (ref 4.22–5.81)
RDW: 15.3 % (ref 11.5–15.5)
WBC: 9.1 10*3/uL (ref 4.0–10.5)
nRBC: 0 % (ref 0.0–0.2)

## 2019-09-04 LAB — HEPARIN LEVEL (UNFRACTIONATED): Heparin Unfractionated: 0.39 IU/mL (ref 0.30–0.70)

## 2019-09-04 LAB — LIPASE, BLOOD: Lipase: 28 U/L (ref 11–51)

## 2019-09-04 MED ORDER — REGADENOSON 0.4 MG/5ML IV SOLN
0.4000 mg | Freq: Once | INTRAVENOUS | Status: AC
  Administered 2019-09-04: 10:00:00 0.4 mg via INTRAVENOUS

## 2019-09-04 MED ORDER — REGADENOSON 0.4 MG/5ML IV SOLN
INTRAVENOUS | Status: AC
  Administered 2019-09-04: 0.4 mg via INTRAVENOUS
  Filled 2019-09-04: qty 5

## 2019-09-04 MED ORDER — TECHNETIUM TC 99M TETROFOSMIN IV KIT: 30.0000 | PACK | Freq: Once | INTRAVENOUS | Status: DC | PRN

## 2019-09-04 MED ORDER — TECHNETIUM TC 99M TETROFOSMIN IV KIT
10.0000 | PACK | Freq: Once | INTRAVENOUS | Status: AC | PRN
  Administered 2019-09-04: 10 via INTRAVENOUS

## 2019-09-04 MED ORDER — TECHNETIUM TC 99M TETROFOSMIN IV KIT
30.0000 | PACK | Freq: Once | INTRAVENOUS | Status: AC | PRN
  Administered 2019-09-04: 30 via INTRAVENOUS

## 2019-09-04 NOTE — Progress Notes (Signed)
      Progress Note   Subjective  Patient doing well. Reports less abdominal pain. LFTs improving. Having stress test today per cardiology.    Objective   Vital signs in last 24 hours: Temp:  [97.2 F (36.2 C)-98.9 F (37.2 C)] 98.1 F (36.7 C) (09/10 1200) Pulse Rate:  [66-73] 73 (09/10 0800) Resp:  [14-22] 22 (09/10 0800) BP: (93-105)/(50-59) 105/55 (09/10 0800) SpO2:  [97 %-100 %] 100 % (09/10 0800) Weight:  [117.2 kg] 117.2 kg (09/10 0500) Last BM Date: 09/03/19 General:    white male in NAD Heart:  Regular rate and rhythm; Lungs: Respirations even and unlabored Abdomen:  Soft, nontender and nondistended.  Extremities:  Without edema. Neurologic:  Alert and oriented,  grossly normal neurologically. Psych:  Cooperative. Normal mood and affect.  Intake/Output from previous day: 09/09 0701 - 09/10 0700 In: 2508.4 [P.O.:1280; I.V.:1078.7; IV Piggyback:149.7] Out: -  Intake/Output this shift: Total I/O In: 164 [I.V.:114; IV Piggyback:50] Out: -   Lab Results: Recent Labs    09/03/19 0517 09/04/19 0159  WBC 9.1 9.1  HGB 12.1* 11.6*  HCT 35.7* 35.4*  PLT 94* 129*   BMET Recent Labs    09/02/19 0210 09/03/19 0517 09/04/19 0159  NA 135 137 132*  K 3.2* 3.3* 3.7  CL 105 105 102  CO2 23 23 23   GLUCOSE 117* 97 95  BUN 17 19 13   CREATININE 1.00 1.05 1.05  CALCIUM 7.6* 7.8* 7.9*   LFT Recent Labs    09/04/19 0159  PROT 6.0*  ALBUMIN 2.8*  AST 60*  ALT 180*  ALKPHOS 194*  BILITOT 3.8*   PT/INR No results for input(s): LABPROT, INR in the last 72 hours.  Studies/Results: Dg Abd 2 Views  Result Date: 09/03/2019 CLINICAL DATA:  Epigastric pain, check biliary stent EXAM: ABDOMEN - 2 VIEW COMPARISON:  09/02/2019 FINDINGS: Scattered large and small bowel gas is noted. Biliary stent is again seen and stable. No free air is noted. No obstructive changes are noted. 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 IMPRESSION: Biliary stent in place.  No acute abnormality noted.  Electronically Signed   By: Inez Catalina M.D.   On: 09/03/2019 14:25       Assessment / Plan:    73 y/o male admitted with choledocholithiasis / cholangitis, now s/p ERCP 9/8 with Dr. Rush Landmark - had sphincterotomy, balloon dilation, stone extraction, stent placement with drainage of bile / puss. Now doing well, infection is controlled, pain improved, LFTs downtrending. General surgery has seen for cholecystectomy and cardiology seeing for AF. He has a history of atrial fibrillation, not on anticoagulation at home, but started on heparin drip while inpatient. He is at risk for post sphincterotomy bleeding in this setting. If plans are made to start him on NOAC for outpatient management, would do so no sooner than 72 hours post ERCP due to bleeding risk. Of note, gastritis and prominent ampulla noted on EGD, biopsies taken and nothing concerning.   Recommend: - slowly advance diet - continue course of antibiotics, PPI - If you plan to use NOAC for treatment of atrial fibrillation would start no sooner than 72 hours post procedure, monitor for bleeding post-sphincterotomy - timing of cholecystectomy per general surgery  - patient will need stent removal 6-8 weeks from now, we will coordinate as outpatient  We will sign off for now, please call with questions.  Ridgetop Cellar, MD [facility]

## 2019-09-04 NOTE — Progress Notes (Signed)
Received report from [facility].  Pt placed on cardiac monitor and O2 sat.  Pt on heparin gtt at 2050units/hr.  Pt on room air.  No complaints at this time from the patient.  Will cont to monitor

## 2019-09-04 NOTE — Progress Notes (Signed)
To [facility] via [place] for NM Myocar study

## 2019-09-04 NOTE — Telephone Encounter (Signed)
The pt has been scheduled to see Dr Ardis Hughs on 10/03/19 at 3:20 pm.  Letter mailed to the pt

## 2019-09-04 NOTE — Progress Notes (Signed)
PROGRESS NOTE    [name]  VCB:[id number] DOB: Nov 10, 1946 DOA: 08/31/2019 PCP: Sinda Du, MD    Brief Narrative:73 y.o.male,w [name], NonHodgkins Lymphoma, Dm2, Pafib, Tobacco use, Copd, OSA on Cpap, presents with c/o abdominal pain x 4 days, worse with food, slight n/v, slight fever, chills. Pt denies cough, cp, palp, sob, diarrhea, brbpr, dysuria. Pt notes that urine is darker than normal.   In ED,   CT abd/ pelvis Musculoskeletal: Left hip prosthesis. 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 Lumbar and lower thoracic spine degenerative changes with a stable 25% L3 status vertebral compression deformity with no acute fracture lines or bony retropulsion.  IMPRESSION: 1. Interval mild intrahepatic biliary ductal dilatation and marked dilatation of the proximal common duct with probable small noncalcified stones in the distal common duct. 2. Probable cholelithiasis. 3. 4 mm nonobstructing lower pole right renal calculus. 4. Extensive colonic diverticulosis. 5. Interval repair of the previously demonstrated left inguinal  hernia. 6. Small right inguinal hernia containing fat. 7. Right femoral head avascular necrosis and moderate right hip degenerative changes.  CXR IMPRESSION: No acute abnormalities. Wbc 14.0, Hgb 16.1, Plt 175 Na 137, K 3.8 Bun 21, Creatinine 1.31 Alb 4.1, Ast 359, Alt 610, Alk phos 169 . T. Bili 3.5 Lipase 23 Lactic acid 1.9  PTT 30 INR 1.1  Urinalysis SG >1.046 Rbc 11-20 Wbc 0-5   Blood culture x2  covid-19 negative  Pt will be admitted for sepsis (Fever, tachycardia  09/04/2019 resting in bed no acute distress complains of some abdominal discomfort with full liquids passing gas had a small bowel movement Labs improving with improving LFTs and normalized WBC afebrile Assessment & Plan:   Principal Problem:   Sepsis ([place]) Active Problems:   Diabetes ([place])    TOBACCO ABUSE   Atrial fibrillation with RVR (HCC)   COPD (chronic obstructive pulmonary disease) (HCC)   Obstructive sleep apnea   ARF (acute renal failure) (HCC)   Abnormal liver function   Common biliary duct obstruction   Acute cholangitis   #1 sepsis present on admission-patient admitted with fever tachycardia and hypotension likely secondary to cholangitis-with right upper quadrant pain with CT findings consistent with biliary ductal dilatation and marked dilatation of the proximal common duct and with probable small noncalcified stones in the distal common duct.He is status post ERCP 09/02/2019. All intrahepatic ducts were severely dilated with a stone causing obstruction, choledocholithiasis and cholangitis and biliary sludge was found, complete removal was accomplished by biliary sphincterotomy and sphincteroplasty and balloon sweeping.  General surgery on board.  LFTs trending down.  #2 paroxysmal A. fib with RVR-rate controlled with Cardizem continue IV heparin.  Patient to have Lexiscan preop appreciate cardiology input  #3 COPD/obstructive sleep apnea continue CPAP at night and DuoNeb.  #4 type 2 diabetes Metformin on hold continue sliding scale  #5 acute renal failureresolved with IV hydration creatinine 1.0 todaycontinue IV hydration labs pending for today.  #6 glaucoma continue eyedrops Timoptic and Xalatan  #7 microscopic hematuria patient has nonobstructive renal stones could be the cause will need outpatient follow-up with urology.  #8 hypokalemia resolved   DVT prophylaxis:Heparin  code Status:Full code  family Communication:dw daughter Disposition Plan:Pending clinical improvement Consultants:[facility] , General surgery  Procedures:Echo 09/01/2019 Antimicrobials:Zosyn    Nutrition Problem: Inadequate oral intake Etiology: acute illness(abdominal pain, nausea, vomiting, possible common bile duct obstruction)     Signs/Symptoms:  (per chart, clear liquid diet)    Interventions:  Refer to RD note for recommendations  Estimated body mass index is 31.45 kg/m as calculated from the following:   Height as of this encounter: '6\' 4"'  (1.93 m).   Weight as of this encounter: 117.2 kg.   Subjective: Complains of some abdominal discomfort with full liquids.  Had a small bowel movement yesterday  Objective: Vitals:   09/04/19 0350 09/04/19 0500 09/04/19 0744 09/04/19 0800  BP:    (!) 105/55  Pulse:    73  Resp:    (!) 22  Temp: 98.9 F (37.2 C)  98.7 F (37.1 C)   TempSrc: Oral  Oral   SpO2:    100%  Weight:  117.2 kg    Height:        Intake/Output Summary (Last 24 hours) at 09/04/2019 1007 Last data filed at 09/04/2019 0900 Gross per 24 hour  Intake 2132.35 ml  Output -  Net 2132.35 ml   Filed Weights   09/02/19 0702 09/03/19 0500 09/04/19 0500  Weight: 115.2 kg 117 kg 117.2 kg    Examination:  General exam: Appears calm and comfortable  Respiratory system: Clear to auscultation. Respiratory effort normal. Cardiovascular system: S1 & S2 heard, RRR. No JVD, murmurs, rubs, gallops or clicks. No pedal edema. Gastrointestinal system: Abdomen is nondistended, soft and generalized tender. No organomegaly or masses felt. Normal bowel sounds heard. Central nervous system: Alert and oriented. No focal neurological deficits. Extremities: Symmetric 5 x 5 power. Skin: No rashes, lesions or ulcers Psychiatry: Judgement and insight appear normal. Mood & affect appropriate.     Data Reviewed: I have personally reviewed following labs and imaging studies  CBC: Recent Labs  Lab 08/31/19 1523 09/01/19 1102 09/03/19 0517 09/04/19 0159  WBC 14.0* 14.2* 9.1 9.1  NEUTROABS 11.8*  --   --   --   HGB 16.1 13.7 12.1* 11.6*  HCT 48.2 42.8 35.7* 35.4*  MCV 93.2 94.7 93.2 94.4  PLT 175 132* 94* 981*   Basic Metabolic Panel: Recent Labs  Lab 08/31/19 1523 09/01/19 1103 09/02/19 0210 09/03/19 0517  09/04/19 0159  NA 137 139 135 137 132*  K 3.8 3.7 3.2* 3.3* 3.7  CL 101 105 105 105 102  CO2 '24 24 23 23 23  ' GLUCOSE 142* 112* 117* 97 95  BUN '21 23 17 19 13  ' CREATININE 1.31* 1.43* 1.00 1.05 1.05  CALCIUM 9.2 8.4* 7.6* 7.8* 7.9*  MG  --   --   --  2.0  --    GFR: Estimated Creatinine Clearance: 89 mL/min (by C-G formula based on SCr of 1.05 mg/dL). Liver Function Tests: Recent Labs  Lab 08/31/19 1523 09/01/19 1103 09/02/19 0210 09/03/19 0517 09/04/19 0159  AST 359* 208* 110* 65* 60*  ALT 610* 446* 292* 215* 180*  ALKPHOS 169* 165* 139* 179* 194*  BILITOT 3.5* 4.2* 4.1* 4.9* 3.8*  PROT 7.8 6.6 5.5* 6.0* 6.0*  ALBUMIN 4.1 3.6 2.8* 2.8* 2.8*   Recent Labs  Lab 08/31/19 1523 09/04/19 0159  LIPASE 23 28   No results for input(s): AMMONIA in the last 168 hours. Coagulation Profile: Recent Labs  Lab 08/31/19 1603  INR 1.1   Cardiac Enzymes: No results for input(s): CKTOTAL, CKMB, CKMBINDEX, TROPONINI in the last 168 hours. BNP (last 3 results) No results for input(s): PROBNP in the last 8760 hours. HbA1C: No results for input(s): HGBA1C in the last 72 hours. CBG: Recent Labs  Lab 09/03/19 1237 09/03/19 1606 09/03/19 1951 09/03/19 2325 09/04/19 0349  GLUCAP  104* 113* 118* 93 87   Lipid Profile: No results for input(s): CHOL, HDL, LDLCALC, TRIG, CHOLHDL, LDLDIRECT in the last 72 hours. Thyroid Function Tests: Recent Labs    09/03/19 1346  TSH 3.066   Anemia Panel: No results for input(s): VITAMINB12, FOLATE, FERRITIN, TIBC, IRON, RETICCTPCT in the last 72 hours. Sepsis Labs: Recent Labs  Lab 08/31/19 1603 08/31/19 1739  LATICACIDVEN 1.9 1.8    Recent Results (from the past 240 hour(s))  Blood Culture (routine x 2)     Status: None (Preliminary result)   Collection Time: 08/31/19  4:03 PM   Specimen: BLOOD RIGHT ARM  Result Value Ref Range Status   Specimen Description   Final    BLOOD RIGHT ARM BOTTLES DRAWN AEROBIC AND ANAEROBIC   Special  Requests Blood Culture adequate volume  Final   Culture   Final    NO GROWTH 4 DAYS Performed at [facility], [address] [place], [place] [address]    Report Status PENDING  Incomplete  Blood Culture (routine x 2)     Status: None (Preliminary result)   Collection Time: 08/31/19  4:13 PM   Specimen: BLOOD RIGHT HAND  Result Value Ref Range Status   Specimen Description   Final    BLOOD RIGHT HAND BOTTLES DRAWN AEROBIC AND ANAEROBIC   Special Requests Blood Culture adequate volume  Final   Culture   Final    NO GROWTH 4 DAYS Performed at [facility], [address] [place], [place] [address]    Report Status PENDING  Incomplete  Urine culture     Status: Abnormal   Collection Time: 08/31/19  6:42 PM   Specimen: In/Out Cath Urine  Result Value Ref Range Status   Specimen Description   Final    IN/OUT CATH URINE Performed at [facility], [address], [place], [place] [address]    Special Requests   Final    NONE Performed at [facility], [address] [place], [place] [address]    Culture MULTIPLE SPECIES PRESENT, Hardy (A)  Final   Report Status 09/02/2019 FINAL  Final  SARS Coronavirus 2 [facility] order, Performed in [facility] hospital lab) Nasopharyngeal Urine, Clean Catch     Status: None   Collection Time: 08/31/19  6:42 PM   Specimen: Urine, Clean Catch; Nasopharyngeal  Result Value Ref Range Status   SARS Coronavirus 2 NEGATIVE NEGATIVE Final    Comment: (NOTE) If result is NEGATIVE SARS-CoV-2 target nucleic acids are NOT DETECTED. The SARS-CoV-2 RNA is generally detectable in upper and lower  respiratory specimens during the acute phase of infection. The lowest  concentration of SARS-CoV-2 viral copies this assay can detect is 250  copies / mL. A negative result does not preclude SARS-CoV-2 infection  and should not be used as the sole basis for treatment or other  patient management decisions.  A negative result may occur with  improper  specimen collection / handling, submission of specimen other  than nasopharyngeal swab, presence of viral mutation(s) within the  areas targeted by this assay, and inadequate number of viral copies  (<250 copies / mL). A negative result must be combined with clinical  observations, patient history, and epidemiological information. If result is POSITIVE SARS-CoV-2 target nucleic acids are DETECTED. The SARS-CoV-2 RNA is generally detectable in upper and lower  respiratory specimens dur ing the acute phase of infection.  Positive  results are indicative of active infection with SARS-CoV-2.  Clinical  correlation  with patient history and other diagnostic information is  necessary to determine patient infection status.  Positive results do  not rule out bacterial infection or co-infection with other viruses. If result is PRESUMPTIVE POSTIVE SARS-CoV-2 nucleic acids MAY BE PRESENT.   A presumptive positive result was obtained on the submitted specimen  and confirmed on repeat testing.  While 2019 novel coronavirus  (SARS-CoV-2) nucleic acids may be present in the submitted sample  additional confirmatory testing may be necessary for epidemiological  and / or clinical management purposes  to differentiate between  SARS-CoV-2 and other Sarbecovirus currently known to infect humans.  If clinically indicated additional testing with an alternate test  methodology [phone]) is advised. The SARS-CoV-2 RNA is generally  detectable in upper and lower respiratory sp ecimens during the acute  phase of infection. The expected result is Negative. Fact Sheet for Patients:  StrictlyIdeas.no Fact Sheet for Healthcare Providers: BankingDealers.co.za This test is not yet approved or cleared by the [place] FDA and has been authorized for detection and/or diagnosis of SARS-CoV-2 by FDA under an Emergency Use Authorization (EUA).  This EUA will remain in  effect (meaning this test can be used) for the duration of the COVID-19 declaration under Section 564(b)(1) of the Act, 21 U.S.C. section 360bbb-3(b)(1), unless the authorization is terminated or revoked sooner. Performed at [facility], [address], [place], [place] [address]   MRSA PCR Screening     Status: None   Collection Time: 08/31/19 11:08 PM   Specimen: Nasal Mucosa; Nasopharyngeal  Result Value Ref Range Status   MRSA by PCR NEGATIVE NEGATIVE Final    Comment:        The GeneXpert MRSA Assay (FDA approved for NASAL specimens only), is one component of a comprehensive MRSA colonization surveillance program. It is not intended to diagnose MRSA infection nor to guide or monitor treatment for MRSA infections. Performed at [facility], [place] [address] [place], [place] [address]   Surgical pcr screen     Status: None   Collection Time: 09/02/19  1:36 AM   Specimen: Nasal Mucosa; Nasal Swab  Result Value Ref Range Status   MRSA, PCR NEGATIVE NEGATIVE Final   Staphylococcus aureus NEGATIVE NEGATIVE Final    Comment: (NOTE) The Xpert SA Assay (FDA approved for NASAL specimens in patients 87 years of age and older), is one component of a comprehensive surveillance program. It is not intended to diagnose infection nor to guide or monitor treatment. Performed at [facility], [place] [address], [place], [place] [address]          Radiology Studies: Dg Abd 2 Views  Result Date: 09/03/2019 CLINICAL DATA:  Epigastric pain, check biliary stent EXAM: ABDOMEN - 2 VIEW COMPARISON:  09/02/2019 FINDINGS: Scattered large and small bowel gas is noted. Biliary stent is again seen and stable. No free air is noted. No obstructive changes are noted. 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 IMPRESSION: Biliary stent in place.  No acute abnormality noted. Electronically Signed   By: Inez Catalina M.D.   On: 09/03/2019 14:25        Scheduled Meds: .  Chlorhexidine Gluconate Cloth  6 each Topical Daily  . diltiazem  240 mg Oral Q breakfast  . feeding supplement  1 Container Oral TID WC  . indomethacin  100 mg Rectal Once  . insulin aspart  0-9 Units Subcutaneous Q4H  . latanoprost  1 drop Both Eyes Q2200  . mouth rinse  15  mL Mouth Rinse BID  . pantoprazole (PROTONIX) IV  40 mg Intravenous QHS  . timolol  1 drop Both Eyes Daily   Continuous Infusions: . sodium chloride Stopped (09/02/19 1700)  . heparin 2,050 Units/hr (09/04/19 0300)  . piperacillin-tazobactam (ZOSYN)  IV Stopped (09/04/19 0850)     LOS: 4 days     Georgette Shell, MD [facility]  If 7PM-7AM, please contact night-coverage [email or web address] Password [email or web address] 09/04/2019, 10:07 AM

## 2019-09-04 NOTE — Progress Notes (Signed)
ANTICOAGULATION CONSULT NOTE - Follow Up Consult  Pharmacy Consult for heparin Indication: atrial fibrillation  Allergies  Allergen Reactions  . Codeine Other (See Comments)    REACTION: Shakes   . Pred Forte [Prednisolone Acetate] Other (See Comments)    Severe burning   Patient Measurements: Height: 6\' 4"  (193 cm) Weight: 258 lb 6.1 oz (117.2 kg) IBW/kg (Calculated) : 86.8 Heparin Dosing Weight: 109 kg  Vital Signs: Temp: 98.7 F (37.1 C) (09/10 0744) Temp Source: Oral (09/10 0744) BP: 105/55 (09/10 0800) Pulse Rate: 73 (09/10 0800)  Labs: Recent Labs    09/01/19 1102  09/02/19 0210 09/03/19 0517 09/03/19 1346 09/03/19 2107 09/04/19 0159 09/04/19 0826  HGB 13.7  --   --  12.1*  --   --  11.6*  --   HCT 42.8  --   --  35.7*  --   --  35.4*  --   PLT 132*  --   --  94*  --   --  129*  --   HEPARINUNFRC  --    < >  --  0.14* 0.21* 0.30  --  0.39  CREATININE  --    < > 1.00 1.05  --   --  1.05  --    < > = values in this interval not displayed.   Estimated Creatinine Clearance: 89 mL/min (by C-G formula based on SCr of 1.05 mg/dL).  Medical History: Past Medical History:  Diagnosis Date  . Atrial fibrillation ([place])   . Avascular necrosis of bones of both hips ([place])   . 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 Cancer ([place])   . Colonic polyp   . Complication of anesthesia    hard to wake up, "felt like died and revived me"  . COPD (chronic obstructive pulmonary disease) (HCC)    mild PFT abnormalities; normal ABG  . Degenerative disc disease, lumbar    HNP of the LS spine  . Diabetes mellitus    type 2  . GERD (gastroesophageal reflux disease)   . Glaucoma   . H/O hiatal hernia   . Hx MRSA infection 2010   sith splenic abcess  . Non Hodgkin's lymphoma ([place]) 06/2009   chemotherapy until 11/2010  . Obesity   . Obstructive sleep apnea    treated with CPAP  . Orthostatic hypotension    lightheadness; no definate loss of consciousness  . Pedal edema   .  PONV (postoperative nausea and vomiting)   . Splenic abscess 07/2009  . Tobacco abuse    45 pack years    Assessment: Patient admitted with abdominal pain - found to have CBD stones and biliary obstruction. Heparin drip initiated on admission for atrial fibrillation -  patient was not taking anticoagulants PTA with hx of Afib  Baseline labs:  Hgb 16.1,Plt 175, INR 1.1, APTT 30 seconds  9/7 Heparin 4000 unit bolus, infusion at 1400 units/hr 1100 Hep level = 0.31 units/ml, in therapeutic range 1837 Hep level = 0.24, rate increased to 1600 units/hr,off at 1:30am 9/8 for ERCP 9/8: Heparin resumed 8pm at 1600 units/hr  Today, 09/04/19 0826 Hep level = 0.39 units/ml on 2050 units/hr, in therapeutic range Hgb 11.6, Plt 129  Goal of Therapy:  Heparin level 0.3-0.7 units/ml Monitor platelets by anticoagulation protocol: Yes   Plan:  Continue Heparin at 2050 units/hr To [facility] for Myoview stress test, for surgery clearance Daily CBC and Hep level Monitor for signs/symptoms of bleeding or thrombosis  Minda Ditto, PharmD 09/04/2019,9:17 AM

## 2019-09-04 NOTE — Progress Notes (Addendum)
[facility] Progress Note  2 Days Post-Op  Subjective: CC-  Sitting up in bed this morning. About to go to [facility] for lexiscan. States that he did have some mild worsening upper abdominal pain and bloating yesterday with full liquids. Denies n/v. Passing flatus, small BM yesterday. LFTs continue to trend down. WBC 9.1, afebrile  Objective: Vital signs in last 24 hours: Temp:  [97.2 F (36.2 C)-98.9 F (37.2 C)] 98.7 F (37.1 C) (09/10 0744) Pulse Rate:  [66-92] 73 (09/10 0800) Resp:  [14-22] 22 (09/10 0800) BP: (93-125)/(50-76) 105/55 (09/10 0800) SpO2:  [97 %-100 %] 100 % (09/10 0800) Weight:  [117.2 kg] 117.2 kg (09/10 0500) Last BM Date: 09/03/19  Intake/Output from previous day: 09/09 0701 - 09/10 0700 In: 2508.4 [P.O.:1280; I.V.:1078.7; IV Piggyback:149.7] Out: -  Intake/Output this shift: No intake/output data recorded.  PE: Gen:  Alert, NAD, pleasant HEENT: EOM's intact, pupils equal and round Card:  irregular Pulm:  trace expiratory wheezing bilaterally, rate and effort normal Abd: Soft, mild distension, mild upper abdominal tenderness, +BS, no HSM Ext:  Calves soft and nontender Psych: A&Ox3  Skin: no rashes noted, warm and dry   Lab Results:  Recent Labs    09/03/19 0517 09/04/19 0159  WBC 9.1 9.1  HGB 12.1* 11.6*  HCT 35.7* 35.4*  PLT 94* 129*   BMET Recent Labs    09/03/19 0517 09/04/19 0159  NA 137 132*  K 3.3* 3.7  CL 105 102  CO2 23 23  GLUCOSE 97 95  BUN 19 13  CREATININE 1.05 1.05  CALCIUM 7.8* 7.9*   PT/INR No results for input(s): LABPROT, INR in the last 72 hours. CMP     Component Value Date/Time   NA 132 (L) 09/04/2019 0159   NA 139 11/14/2011 0919   K 3.7 09/04/2019 0159   K 4.4 11/14/2011 0919   CL 102 09/04/2019 0159   CL 102 11/14/2011 0919   CO2 23 09/04/2019 0159   CO2 28 11/14/2011 0919   GLUCOSE 95 09/04/2019 0159   GLUCOSE 100 11/14/2011 0919   BUN 13 09/04/2019 0159   BUN 17 11/14/2011 0919    CREATININE 1.05 09/04/2019 0159   CREATININE 0.9 11/14/2011 0919   CALCIUM 7.9 (L) 09/04/2019 0159   CALCIUM 8.6 11/14/2011 0919   PROT 6.0 (L) 09/04/2019 0159   PROT 7.1 11/14/2011 0919   ALBUMIN 2.8 (L) 09/04/2019 0159   ALBUMIN 4.0 11/14/2011 0919   AST 60 (H) 09/04/2019 0159   AST 22 11/14/2011 0919   ALT 180 (H) 09/04/2019 0159   ALT 23 11/14/2011 0919   ALKPHOS 194 (H) 09/04/2019 0159   ALKPHOS 55 11/14/2011 0919   BILITOT 3.8 (H) 09/04/2019 0159   BILITOT 0.50 11/14/2011 0919   GFRNONAA >60 09/04/2019 0159   GFRAA >60 09/04/2019 0159   Lipase     Component Value Date/Time   LIPASE 23 08/31/2019 1523       Studies/Results: Dg Ercp  Result Date: 09/02/2019 CLINICAL DATA:  73 year old male with biliary ductal dilatation and probable choledocholithiasis. EXAM: ERCP TECHNIQUE: Multiple spot images obtained with the fluoroscopic device and submitted for interpretation post-procedure. FLUOROSCOPY TIME:  Fluoroscopy Time:  3 minutes 57 seconds Radiation Exposure Index (if provided by the fluoroscopic device): 129 mGy COMPARISON:  None. FINDINGS: These images were submitted for radiologic interpretation only. Please see the procedural report for the amount of contrast and the fluoroscopy time utilized. A total of 17 saved images and 3 cine clips  are submitted for review. The images demonstrate a flexible endoscope in the descending duodenum with deep wire cannulation of the intrahepatic ducts followed by sphincterotomy, balloon dilation and balloon sweeping of the common bile duct. On the initial cholangiogram images, there is biliary ductal dilatation and mobile filling defects in the distal common duct most consistent with choledocholithiasis. On the final image, a plastic biliary stent has been placed. IMPRESSION: 1. Choledocholithiasis. 2. ERCP with sphincterotomy, balloon sweep of the common duct and placement of plastic biliary stent. Electronically Signed   By: Jacqulynn Cadet  M.D.   On: 09/02/2019 10:15   Dg Abd 2 Views  Result Date: 09/03/2019 CLINICAL DATA:  Epigastric pain, check biliary stent EXAM: ABDOMEN - 2 VIEW COMPARISON:  09/02/2019 FINDINGS: Scattered large and small bowel gas is noted. Biliary stent is again seen and stable. No free air is noted. No obstructive changes are noted. 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 IMPRESSION: Biliary stent in place.  No acute abnormality noted. Electronically Signed   By: Inez Catalina M.D.   On: 09/03/2019 14:25    Anti-infectives: Anti-infectives (From admission, onward)   Start     Dose/Rate Route Frequency Ordered Stop   09/01/19 0700  vancomycin (VANCOCIN) IVPB 750 mg/150 ml premix  Status:  Discontinued     750 mg 150 mL/hr over 60 Minutes Intravenous Every 12 hours 08/31/19 2056 09/01/19 1016   08/31/19 2200  piperacillin-tazobactam (ZOSYN) IVPB 3.375 g     3.375 g 12.5 mL/hr over 240 Minutes Intravenous Every 8 hours 08/31/19 2055     08/31/19 2100  vancomycin (VANCOCIN) IVPB 1000 mg/200 mL premix  Status:  Discontinued     1,000 mg 200 mL/hr over 60 Minutes Intravenous Every 1 hr x 2 08/31/19 2055 08/31/19 2259   08/31/19 1545  cefTRIAXone (ROCEPHIN) 2 g in sodium chloride 0.9 % 100 mL IVPB     2 g 200 mL/hr over 30 Minutes Intravenous  Once 08/31/19 1541 08/31/19 1631   08/31/19 1545  metroNIDAZOLE (FLAGYL) IVPB 500 mg     500 mg 100 mL/hr over 60 Minutes Intravenous  Once 08/31/19 1541 08/31/19 1650       Assessment/Plan Hx of NHL - has not had follow up in several years Paroxysmal A Fib - started on IV heparin 9/8. Cardiology recommends starting [facility] at discharge COPDon 2L prn OSA on CPAP DM HTN  Cholangitis Choledocholithiasis Cholecystitis - unable to visualize cystic duct with cholangiogram during ERCP -S/pERCP 9/8: had sphincterotomy, balloon dilation, stone extraction, stent placement with drainage of bile / puss  - WBC WNL and LFTs continue to trend down. With upper abdominal pain and  bloating with FLD yesterday, check a lipase today. Continue antibiotics. Going to [facility] for lexiscan. Appreciate cardiology recommendations. Ok to resume full liquids when he returns.  If he improves from cholangitis we may be able to discharge him and follow up outpatient to discuss elective cholecystectomy. If he does not improve he likely is out of the window for a cholecystectomy and would require perc chole this admission.  ID -[id number]>> VTE -SCDs, IV heparin FEN -IVF, NPO for procedure Foley - none Follow up - TBD   LOS: 4 days    Wellington Hampshire , [facility] 09/04/2019, 9:04 AM Pager: [phone] Mon-Thurs 7:00 am-4:30 pm Fri 7:00 am -11:30 AM Sat-Sun 7:00 am-11:30 am

## 2019-09-04 NOTE — Telephone Encounter (Signed)
-----   Message from Irving Copas., MD sent at 09/04/2019  3:35 PM EDT ----- Regarding: RE: patient follow up Rey Fors, Either way will work. LFTs in 1.5-2 weeks would be helpful as well. This would need to be a formal ERCP with stent pull and cholangiogram to ensure all stone/debris/sludge is removed. With Dan or myself. Thanks. GM ----- Message ----- From: Timothy Lasso, RN Sent: 09/04/2019   2:53 PM EDT To: Milus Banister, MD, # Subject: FW: patient follow up                           ----- Message ----- From: Yetta Flock, MD Sent: 09/04/2019   1:48 PM EDT To: Timothy Lasso, RN Subject: patient follow up                              Hi Manuela Halbur,  This patient will need an EGD with stent pull per either Dr. Ardis Hughs or McGrath in 6-8 weeks. I think may be Ardis Hughs as he did a prior EUS for this patient remotely? He will be discharged in upcoming days I just don't want this to be lost. Maybe good to have him follow up in clinic in 1 month with either of them. Thanks  Richardson Landry

## 2019-09-05 ENCOUNTER — Encounter: Payer: Self-pay | Admitting: Gastroenterology

## 2019-09-05 ENCOUNTER — Telehealth: Payer: Self-pay

## 2019-09-05 LAB — CBC
HCT: 34.8 % — ABNORMAL LOW (ref 39.0–52.0)
Hemoglobin: 11.3 g/dL — ABNORMAL LOW (ref 13.0–17.0)
MCH: 30.5 pg (ref 26.0–34.0)
MCHC: 32.5 g/dL (ref 30.0–36.0)
MCV: 93.8 fL (ref 80.0–100.0)
Platelets: 149 10*3/uL — ABNORMAL LOW (ref 150–400)
RBC: 3.71 MIL/uL — ABNORMAL LOW (ref 4.22–5.81)
RDW: 15.3 % (ref 11.5–15.5)
WBC: 8.9 10*3/uL (ref 4.0–10.5)
nRBC: 0 % (ref 0.0–0.2)

## 2019-09-05 LAB — COMPREHENSIVE METABOLIC PANEL
ALT: 153 U/L — ABNORMAL HIGH (ref 0–44)
AST: 63 U/L — ABNORMAL HIGH (ref 15–41)
Albumin: 2.9 g/dL — ABNORMAL LOW (ref 3.5–5.0)
Alkaline Phosphatase: 171 U/L — ABNORMAL HIGH (ref 38–126)
Anion gap: 11 (ref 5–15)
BUN: 12 mg/dL (ref 8–23)
CO2: 23 mmol/L (ref 22–32)
Calcium: 8.8 mg/dL — ABNORMAL LOW (ref 8.9–10.3)
Chloride: 107 mmol/L (ref 98–111)
Creatinine, Ser: 1.03 mg/dL (ref 0.61–1.24)
GFR calc Af Amer: >60 mL/min (ref >60)
GFR calc non Af Amer: >60 mL/min (ref >60)
Glucose, Bld: 100 mg/dL — ABNORMAL HIGH (ref 70–99)
Potassium: 3.6 mmol/L (ref 3.5–5.1)
Sodium: 141 mmol/L (ref 135–145)
Total Bilirubin: 2.7 mg/dL — ABNORMAL HIGH (ref 0.3–1.2)
Total Protein: 5.9 g/dL — ABNORMAL LOW (ref 6.5–8.1)

## 2019-09-05 LAB — GLUCOSE, CAPILLARY
Glucose-Capillary: 103 mg/dL — ABNORMAL HIGH (ref 70–99)
Glucose-Capillary: 103 mg/dL — ABNORMAL HIGH (ref 70–99)
Glucose-Capillary: 125 mg/dL — ABNORMAL HIGH (ref 70–99)
Glucose-Capillary: 118 mg/dL — ABNORMAL HIGH (ref 70–99)
Glucose-Capillary: 136 mg/dL — ABNORMAL HIGH (ref 70–99)

## 2019-09-05 LAB — CULTURE, BLOOD (ROUTINE X 2)
Culture: NO GROWTH
Culture: NO GROWTH
Special Requests: ADEQUATE
Special Requests: ADEQUATE

## 2019-09-05 LAB — HEPARIN LEVEL (UNFRACTIONATED): Heparin Unfractionated: 0.38 IU/mL (ref 0.30–0.70)

## 2019-09-05 MED ORDER — METOPROLOL TARTRATE 50 MG PO TABS
50.0000 mg | ORAL_TABLET | Freq: Two times a day (BID) | ORAL | Status: DC
  Administered 2019-09-05 – 2019-09-06 (×3): 50 mg via ORAL
  Filled 2019-09-05: qty 2
  Filled 2019-09-05 (×2): qty 1

## 2019-09-05 MED ORDER — DILTIAZEM HCL ER COATED BEADS 120 MG PO CP24
120.0000 mg | ORAL_CAPSULE | Freq: Every day | ORAL | Status: DC
  Administered 2019-09-05 – 2019-09-06 (×2): 120 mg via ORAL
  Filled 2019-09-05 (×2): qty 1

## 2019-09-05 MED ORDER — DOCUSATE SODIUM 100 MG PO CAPS
100.0000 mg | ORAL_CAPSULE | Freq: Two times a day (BID) | ORAL | Status: DC
  Administered 2019-09-05 – 2019-09-06 (×3): 100 mg via ORAL
  Filled 2019-09-05 (×3): qty 1

## 2019-09-05 MED ORDER — POLYETHYLENE GLYCOL 3350 17 G PO PACK
17.0000 g | PACK | Freq: Every day | ORAL | Status: DC
  Administered 2019-09-06: 09:00:00 17 g via ORAL
  Filled 2019-09-05 (×2): qty 1

## 2019-09-05 NOTE — Telephone Encounter (Signed)
Appt scheduled and pt sent a letter with appt info.

## 2019-09-05 NOTE — Progress Notes (Signed)
ANTICOAGULATION CONSULT NOTE - Follow Up Consult  Pharmacy Consult for heparin Indication: atrial fibrillation  Allergies  Allergen Reactions  . Codeine Other (See Comments)    REACTION: Shakes   . Pred Forte [Prednisolone Acetate] Other (See Comments)    Severe burning   Patient Measurements: Height: 6\' 4"  (193 cm) Weight: 262 lb 5.6 oz (119 kg) IBW/kg (Calculated) : 86.8 Heparin Dosing Weight: 109 kg  Vital Signs: Temp: 98.1 F (36.7 C) (09/11 0647) Temp Source: Oral (09/11 0647) BP: 106/66 (09/11 0400) Pulse Rate: 94 (09/11 0400)  Labs: Recent Labs    09/03/19 0517  09/03/19 2107 09/04/19 0159 09/04/19 0826 09/05/19 0202  HGB 12.1*  --   --  11.6*  --  11.3*  HCT 35.7*  --   --  35.4*  --  34.8*  PLT 94*  --   --  129*  --  149*  HEPARINUNFRC 0.14*   < > 0.30  --  0.39 0.38  CREATININE 1.05  --   --  1.05  --  1.03   < > = values in this interval not displayed.   Estimated Creatinine Clearance: 91.4 mL/min (by C-G formula based on SCr of 1.03 mg/dL).  Medical History: Past Medical History:  Diagnosis Date  . Atrial fibrillation ([place])   . Avascular necrosis of bones of both hips ([place])   . 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 Cancer ([place])   . Colonic polyp   . Complication of anesthesia    hard to wake up, "felt like died and revived me"  . COPD (chronic obstructive pulmonary disease) ([facility])    mild PFT abnormalities; normal ABG  . Degenerative disc disease, lumbar    HNP of the LS spine  . Diabetes mellitus    type 2  . GERD (gastroesophageal reflux disease)   . Glaucoma   . H/O hiatal hernia   . Hx MRSA infection 2010   sith splenic abcess  . Non Hodgkin's lymphoma ([place]) 06/2009   chemotherapy until 11/2010  . Obesity   . Obstructive sleep apnea    treated with CPAP  . Orthostatic hypotension    lightheadness; no definate loss of consciousness  . Pedal edema   . PONV (postoperative nausea and vomiting)   . Splenic abscess 07/2009  . Tobacco  abuse    45 pack years   Assessment: Patient admitted with abdominal pain - found to have CBD stones and biliary obstruction. Heparin drip initiated on admission for atrial fibrillation -  patient was not taking anticoagulants PTA with hx of Afib  Baseline labs:  Hgb 16.1,Plt 175, INR 1.1, APTT 30 seconds  9/7 Heparin 4000 unit bolus, infusion at 1400 units/hr 1100 Hep level = 0.31 units/ml, in therapeutic range 1837 Hep level = 0.24, rate increased to 1600 units/hr,off at 1:30am 9/8 for ERCP 9/8: Heparin resumed 8pm at 1600 units/hr  Today, 09/05/19 0826 Hep level = 0.38 units/ml on 2050 units/hr, in therapeutic range Hgb 11.3, Plt 149 (increased) No sign bleed, remains NPO for surgery eval  Goal of Therapy:  Heparin level 0.3-0.7 units/ml Monitor platelets by anticoagulation protocol: Yes   Plan:  Continue Heparin at 2050 units/hr Cards cleared for surgery Daily CBC and Hep level Case manager to price Xarelto and Eliquis for Afib Monitor for signs/symptoms of bleeding or thrombosis  Minda Ditto, PharmD 09/05/2019,7:32 AM

## 2019-09-05 NOTE — Telephone Encounter (Signed)
-----   Message from Milus Banister, MD sent at 09/05/2019  7:33 AM EDT ----- Regarding: RE: patient follow up He is actually originally a patient of Dr. Blanch Media (he referred him to me for EUS many years ago).  I'll forward this to him as well.  ----- Message ----- From: Timothy Lasso, RN Sent: 09/04/2019   2:53 PM EDT To: Milus Banister, MD, # Subject: FW: patient follow up                           ----- Message ----- From: Yetta Flock, MD Sent: 09/04/2019   1:48 PM EDT To: Timothy Lasso, RN Subject: patient follow up                              Hi Mily Malecki,  This patient will need an EGD with stent pull per either Dr. Ardis Hughs or Foreman in 6-8 weeks. I think may be Ardis Hughs as he did a prior EUS for this patient remotely? He will be discharged in upcoming days I just don't want this to be lost. Maybe good to have him follow up in clinic in 1 month with either of them. Thanks  Richardson Landry

## 2019-09-05 NOTE — Discharge Instructions (Addendum)
Gallbladder Eating Plan If you have a gallbladder condition, you may have trouble digesting fats. Eating a low-fat diet can help reduce your symptoms, and may be helpful before and after having surgery to remove your gallbladder (cholecystectomy). Your health care provider may recommend that you work with a diet and nutrition specialist (dietitian) to help you reduce the amount of fat in your diet. What are tips for following this plan? General guidelines  Limit your fat intake to less than 30% of your total daily calories. If you eat around 1,800 calories each day, this is less than 60 grams (g) of fat per day.  Fat is an important part of a healthy diet. Eating a low-fat diet can make it hard to maintain a healthy body weight. Ask your dietitian how much fat, calories, and other nutrients you need each day.  Eat small, frequent meals throughout the day instead of three large meals.  Drink at least 8-10 cups of fluid a day. Drink enough fluid to keep your urine clear or pale yellow.  Limit alcohol intake to no more than 1 drink a day for nonpregnant women and 2 drinks a day for men. One drink equals 12 oz of beer, 5 oz of wine, or 1 oz of hard liquor. Reading food labels  Check Nutrition Facts on food labels for the amount of fat per serving. Choose foods with less than 3 grams of fat per serving. Shopping  Choose nonfat and low-fat healthy foods. Look for the words nonfat, low fat, or fat free.  Avoid buying processed or prepackaged foods. Cooking  Cook using low-fat methods, such as baking, broiling, grilling, or boiling.  Cook with small amounts of healthy fats, such as olive oil, grapeseed oil, canola oil, or sunflower oil. What foods are recommended?   All fresh, frozen, or canned fruits and vegetables.  Whole grains.  Low-fat or non-fat (skim) milk and yogurt.  Lean meat, skinless poultry, fish, eggs, and beans.  Low-fat protein supplement powders or  drinks.  Spices and herbs. What foods are not recommended?  High-fat foods. These include baked goods, fast food, fatty cuts of meat, ice cream, french toast, sweet rolls, pizza, cheese bread, foods covered with butter, creamy sauces, or cheese.  Fried foods. These include french fries, tempura, battered fish, breaded chicken, fried breads, and sweets.  Foods with strong odors.  Foods that cause bloating and gas. Summary  A low-fat diet can be helpful if you have a gallbladder condition, or before and after gallbladder surgery.  Limit your fat intake to less than 30% of your total daily calories. This is about 60 g of fat if you eat 1,800 calories each day.  Eat small, frequent meals throughout the day instead of three large meals. This information is not intended to replace advice given to you by your health care provider. Make sure you discuss any questions you have with your health care provider. Document Released: 12/16/2013 Document Revised: 04/03/2019 Document Reviewed: 01/18/2017 Elsevier Patient Education  2020 [place] on my medicine - XARELTO (Rivaroxaban)  Why was Xarelto prescribed for you? Xarelto was prescribed for you to reduce the risk of a blood clot forming that can cause a stroke if you have a medical condition called atrial fibrillation (a type of irregular heartbeat).  What do you need to know about xarelto ? Take your Xarelto ONCE DAILY at the same time every day with your evening meal. If you have difficulty swallowing the tablet whole, you  may crush it and mix in applesauce just prior to taking your dose.  Take Xarelto exactly as prescribed by your doctor and DO NOT stop taking Xarelto without talking to the doctor who prescribed the medication.  Stopping without other stroke prevention medication to take the place of Xarelto may increase your risk of developing a clot that causes a stroke.  Refill your prescription before you run  out.  After discharge, you should have regular check-up appointments with your healthcare provider that is prescribing your Xarelto.  In the future your dose may need to be changed if your kidney function or weight changes by a significant amount.  What do you do if you miss a dose? If you are taking Xarelto ONCE DAILY and you miss a dose, take it as soon as you remember on the same day then continue your regularly scheduled once daily regimen the next day. Do not take two doses of Xarelto at the same time or on the same day.   Important Safety Information A possible side effect of Xarelto is bleeding. You should call your healthcare provider right away if you experience any of the following: ? Bleeding from an injury or your nose that does not stop. ? Unusual colored urine (red or dark brown) or unusual colored stools (red or black). ? Unusual bruising for unknown reasons. ? A serious fall or if you hit your head (even if there is no bleeding).  Some medicines may interact with Xarelto and might increase your risk of bleeding while on Xarelto. To help avoid this, consult your healthcare provider or pharmacist prior to using any new prescription or non-prescription medications, including herbals, vitamins, non-steroidal anti-inflammatory drugs (NSAIDs) and supplements.  This website has more information on Xarelto: [email or web address]

## 2019-09-05 NOTE — Progress Notes (Signed)
[facility] Progress Note  3 Days Post-Op  Subjective: CC-  Slowly improving since ERCP. WBC 8.9, afebrile. LFTs continue to trend down. Continues to feel bloated after PO intake, but he denies n/v. He reports some LLQ pain. Passing flatus and has had a couple tiny BMs, but no good BM since admission. Feels hungry. Looking forward to solid food. States that he does still feel weak. Thinks that it is a combination of not eating for several days and being in the hospital where he has not gotten out of bed much.  Objective: Vital signs in last 24 hours: Temp:  [98 F (36.7 C)-98.7 F (37.1 C)] 98.1 F (36.7 C) (09/11 0647) Pulse Rate:  [68-94] 94 (09/11 0400) Resp:  [14-20] 16 (09/11 0400) BP: (104-133)/(57-87) 105/75 (09/11 0818) SpO2:  [94 %-99 %] 94 % (09/11 0400) Weight:  KB:[id number] kg] 119 kg (09/11 0500) Last BM Date: 09/03/19  Intake/Output from previous day: 09/10 0701 - 09/11 0700 In: 284 [P.O.:120; I.V.:114; IV Piggyback:50] Out: -  Intake/Output this shift: No intake/output data recorded.  PE: Gen: Alert, NAD, pleasant HEENT: EOM's intact, pupils equal and round Card: irregular Pulm: trace expiratory wheezing bilaterally, rate and effort normal Abd: Soft,protuberant, mild distension, mild upper and LLQ abdominal tenderness without rebound or guarding, +BS, no HSM [id number] soft and nontender Psych: A&Ox3  Skin: no rashes noted, warm and dry  Lab Results:  Recent Labs    09/04/19 0159 09/05/19 0202  WBC 9.1 8.9  HGB 11.6* 11.3*  HCT 35.4* 34.8*  PLT 129* 149*   BMET Recent Labs    09/04/19 0159 09/05/19 0202  NA 132* 141  K 3.7 3.6  CL 102 107  CO2 23 23  GLUCOSE 95 100*  BUN 13 12  CREATININE 1.05 1.03  CALCIUM 7.9* 8.8*   PT/INR No results for input(s): LABPROT, INR in the last 72 hours. CMP     Component Value Date/Time   NA 141 09/05/2019 0202   NA 139 11/14/2011 0919   K 3.6 09/05/2019 0202   K 4.4 11/14/2011 0919    CL 107 09/05/2019 0202   CL 102 11/14/2011 0919   CO2 23 09/05/2019 0202   CO2 28 11/14/2011 0919   GLUCOSE 100 (H) 09/05/2019 0202   GLUCOSE 100 11/14/2011 0919   BUN 12 09/05/2019 0202   BUN 17 11/14/2011 0919   CREATININE 1.03 09/05/2019 0202   CREATININE 0.9 11/14/2011 0919   CALCIUM 8.8 (L) 09/05/2019 0202   CALCIUM 8.6 11/14/2011 0919   PROT 5.9 (L) 09/05/2019 0202   PROT 7.1 11/14/2011 0919   ALBUMIN 2.9 (L) 09/05/2019 0202   ALBUMIN 4.0 11/14/2011 0919   AST 63 (H) 09/05/2019 0202   AST 22 11/14/2011 0919   ALT 153 (H) 09/05/2019 0202   ALT 23 11/14/2011 0919   ALKPHOS 171 (H) 09/05/2019 0202   ALKPHOS 55 11/14/2011 0919   BILITOT 2.7 (H) 09/05/2019 0202   BILITOT 0.50 11/14/2011 0919   GFRNONAA >60 09/05/2019 0202   GFRAA >60 09/05/2019 0202   Lipase     Component Value Date/Time   LIPASE 28 09/04/2019 0159       Studies/Results: Nm Myocar Multi W/spect W/wall Motion / Ef  Result Date: 09/04/2019 CLINICAL DATA:  Preoperative cardiac evaluation. Atrial fibrillation, diabetes, obesity and COPD. Positive family history of coronary artery disease. EXAM: MYOCARDIAL IMAGING WITH SPECT (REST AND PHARMACOLOGIC-STRESS) GATED LEFT VENTRICULAR WALL MOTION STUDY LEFT VENTRICULAR EJECTION FRACTION TECHNIQUE: Standard myocardial SPECT imaging  was performed after resting intravenous injection of 10 mCi Tc-68m tetrofosmin. Subsequently, intravenous infusion of Lexiscan was performed under the supervision of the Cardiology staff. At peak effect of the drug, 30 mCi Tc-43m tetrofosmin was injected intravenously and standard myocardial SPECT imaging was performed. Quantitative gated imaging was also performed to evaluate left ventricular wall motion, and estimate left ventricular ejection fraction. COMPARISON:  None. FINDINGS: Perfusion: A moderate area of decreased myocardial activity is seen in the mid and distal inferior, inferolateral, and inferoapical walls on stress images, which  shows reversibility on resting images. Wall Motion: Normal left ventricular wall motion. No left ventricular dilation. Left Ventricular Ejection Fraction: 50 % End diastolic volume 123456 ml End systolic volume 67 ml IMPRESSION: 1. Moderate reversible myocardial perfusion defect in the inferior, inferolateral, and inferoapical walls of the left ventricle, consistent with inducible myocardial ischemia. 2. Normal left ventricular wall motion. 3. Left ventricular ejection fraction 50% 4. Non invasive risk stratification*: Intermediate *2012 Appropriate Use Criteria for Coronary Revascularization Focused Update: J Am Coll Cardiol. [id number]. http://content.[email or web address]?articleid=[email or web address] Electronically Signed   By: Marlaine Hind M.D.   On: 09/04/2019 13:54   Dg Abd 2 Views  Result Date: 09/03/2019 CLINICAL DATA:  Epigastric pain, check biliary stent EXAM: ABDOMEN - 2 VIEW COMPARISON:  09/02/2019 FINDINGS: Scattered large and small bowel gas is noted. Biliary stent is again seen and stable. No free air is noted. No obstructive changes are noted. 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 IMPRESSION: Biliary stent in place.  No acute abnormality noted. Electronically Signed   By: Inez Catalina M.D.   On: 09/03/2019 14:25    Anti-infectives: Anti-infectives (From admission, onward)   Start     Dose/Rate Route Frequency Ordered Stop   09/01/19 0700  vancomycin (VANCOCIN) IVPB 750 mg/150 ml premix  Status:  Discontinued     750 mg 150 mL/hr over 60 Minutes Intravenous Every 12 hours 08/31/19 2056 09/01/19 1016   08/31/19 2200  piperacillin-tazobactam (ZOSYN) IVPB 3.375 g     3.375 g 12.5 mL/hr over 240 Minutes Intravenous Every 8 hours 08/31/19 2055     08/31/19 2100  vancomycin (VANCOCIN) IVPB 1000 mg/200 mL premix  Status:  Discontinued     1,000 mg 200 mL/hr over 60 Minutes Intravenous Every 1 hr x 2 08/31/19 2055 08/31/19 2259   08/31/19 1545  cefTRIAXone (ROCEPHIN) 2 g in sodium chloride 0.9 % 100  mL IVPB     2 g 200 mL/hr over 30 Minutes Intravenous  Once 08/31/19 1541 08/31/19 1631   08/31/19 1545  metroNIDAZOLE (FLAGYL) IVPB 500 mg     500 mg 100 mL/hr over 60 Minutes Intravenous  Once 08/31/19 1541 08/31/19 1650       Assessment/Plan 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 Cardiology recommends starting [facility] at discharge COPDon 2L prn OSAon CPAP DM HTN  Cholangitis Choledocholithiasis Cholecystitis - unable to visualize cystic duct with cholangiogram during ERCP -S/pERCP 9/8:had sphincterotomy, balloon dilation, stone extraction, stent placement with drainage of bile / puss - WBC WNL and LFTs continue to trend down.  - Discussed case with Dr. Hassell Done, unless patient desires surgery this admission it may be beneficial to delay and perform outpatient. This way he will be over the acute infection of cholangitis and he can work to improve his nutritional status as well as physical strength. Patient agrees. Will start soft diet and Boost. Add bowel regimen with colace/miralax. Mobilize. Once medically  stable he may be discharged home and I will arrange follow up with Dr. Hassell Done in a few weeks.  ID -[id number]>> VTE -SCDs,IV heparin FEN -IVF, soft diet, Boost Foley - none Follow up - Dr. Hassell Done, GI   LOS: 5 days    Wellington Hampshire , [facility] 09/05/2019, 8:50 AM Pager: [phone] Mon-Thurs 7:00 am-4:30 pm Fri 7:00 am -11:30 AM Sat-Sun 7:00 am-11:30 am

## 2019-09-05 NOTE — Telephone Encounter (Signed)
-----   Message from Irving Copas., MD sent at 09/05/2019 11:41 AM EDT ----- Regarding: RE: patient follow up Sierria Bruney, Yes that will be fine. Thanks. Gabe ----- Message ----- From: Timothy Lasso, RN Sent: 09/05/2019   9:12 AM EDT To: Irving Copas., MD Subject: RE: patient follow up                          Dr Rush Landmark do you want the pt scheduled to see you in a month for follow up? ----- Message ----- From: Irene Shipper, MD Sent: 09/05/2019   9:07 AM EDT To: Milus Banister, MD, Timothy Lasso, RN, # Subject: RE: patient follow up                          Gabe,I have not seen this guy in a decade (possibly once, never scoped him).  Anyway, I would appreciate it if you could arrange to remove the biliary stent that you placed, when you deem it appropriate.  ThanksJohn ----- Message ----- From: Milus Banister, MD Sent: 09/05/2019   7:33 AM EDT To: Irene Shipper, MD, Timothy Lasso, RN, # Subject: RE: patient follow up                          He is actually originally a patient of Dr. Blanch Media (he referred him to me for EUS many years ago).  I'll forward this to him as well.  ----- Message ----- From: Timothy Lasso, RN Sent: 09/04/2019   2:53 PM EDT To: Milus Banister, MD, # Subject: FW: patient follow up                           ----- Message ----- From: Yetta Flock, MD Sent: 09/04/2019   1:48 PM EDT To: Timothy Lasso, RN Subject: patient follow up                              Hi Vernie Piet,  This patient will need an EGD with stent pull per either Dr. Ardis Hughs or [place] in 6-8 weeks. I think may be Ardis Hughs as he did a prior EUS for this patient remotely? He will be discharged in upcoming days I just don't want this to be lost. Maybe good to have him follow up in clinic in 1 month with either of them. Thanks  Richardson Landry

## 2019-09-05 NOTE — Plan of Care (Signed)
  Problem: Activity: Goal: Risk for activity intolerance will decrease Outcome: Progressing   Problem: Pain Managment: Goal: General experience of comfort will improve Outcome: Progressing   Problem: Safety: Goal: Ability to remain free from injury will improve Outcome: Progressing   

## 2019-09-05 NOTE — Progress Notes (Signed)
Intermediate nuclear stress study, would recommend proceeding with surgery, I'll manage his cardiac issues. Decrease his diltiazem and add metoprolol for cardiovascular protection.  Also started him on statin low dose for now in view of abnormal LFTs, however abnormal LFTs are related to acute cholecystitis.  Unless surgery is postponed for a specific reason, would prefer him to have surgery while inpatient as he is now stable. I will keep NPO until seen by cardiology.   Adrian Prows, MD, [facility] 09/05/2019, 5:48 AM [facility] [place] Pager: [phone] Office: [phone] If no answer Cell [phone]

## 2019-09-05 NOTE — Telephone Encounter (Signed)
Appt rescheduled to Dr Henrene Pastor.  The pt has been mailed a letter with the appointment information.

## 2019-09-05 NOTE — Progress Notes (Signed)
PROGRESS NOTE    [name]  OMB:[id number] DOB: 1946/03/26 DOA: 08/31/2019 PCP: Sinda Du, MD    Brief Narrative: 73 y.o.male,w Gerd, NonHodgkins Lymphoma, Dm2, Pafib, Tobacco use, Copd, OSA on Cpap, presents with c/o abdominal pain x 4 days, worse with food, slight n/v, slight fever, chills. Pt denies cough, cp, palp, sob, diarrhea, brbpr, dysuria. Pt notes that urine is darker than normal.   In ED,   CT abd/ pelvis Musculoskeletal: Left hip prosthesis. 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 Lumbar and lower thoracic spine degenerative changes with a stable 25% L3 status vertebral compression deformity with no acute fracture lines or bony retropulsion.  IMPRESSION: 1. Interval mild intrahepatic biliary ductal dilatation and marked dilatation of the proximal common duct with probable small noncalcified stones in the distal common duct. 2. Probable cholelithiasis. 3. 4 mm nonobstructing lower pole right renal calculus. 4. Extensive colonic diverticulosis. 5. Interval repair of the previously demonstrated left inguinal hernia. 6. Small right inguinal hernia containing fat. 7. Right femoral head avascular necrosis and moderate right hip degenerative changes.  CXR IMPRESSION: No acute abnormalities. Wbc 14.0, Hgb 16.1, Plt 175 Na 137, K 3.8 Bun 21, Creatinine 1.31 Alb 4.1, Ast 359, Alt 610, Alk phos 169 . T. Bili 3.5 Lipase 23 Lactic acid 1.9  PTT 30 INR 1.1  Urinalysis SG >1.046 Rbc 11-20 Wbc 0-5   Blood culture x2  covid-19 negative  Pt will be admitted for sepsis (Fever, tachycardia Assessment & Plan:   Principal Problem:   Sepsis ([facility]) Active Problems:   Diabetes ([facility])   TOBACCO ABUSE   Atrial fibrillation with RVR ([facility])   COPD (chronic obstructive pulmonary disease) ([facility])   Obstructive sleep apnea   ARF (acute renal failure) ([facility])   Abnormal liver function   Common  biliary duct obstruction   Acute cholangitis   #1 sepsis present on admission-patient admitted with fever tachycardia and hypotension likely secondary to cholangitis-with right upper quadrant pain with CT findings consistent with biliary ductal dilatation and marked dilatation of the proximal common duct and with probable small noncalcified stones in the distal common duct.He is status post ERCP 09/02/2019. All intrahepatic ducts were severely dilated with a stone causing obstruction, choledocholithiasis and cholangitis and biliary sludge was found, complete removal was accomplished by biliary sphincterotomy and sphincteroplasty and balloon sweeping.  Patient wants to have surgery done at a later time.  Dr. Hassell Done recommends surgery to be done as an outpatient once he is over the acute infection and improve his nutritional status and physical strength.  PT consult.  Out of bed ambulate as tolerated.  Diet has been increased to soft diet and boost.  #2 paroxysmal A. fib with RVR-rate controlled with Cardizem and metoprolol. continue IV heparin. lexiscan intermediate.start doac tomorrow.  #3 COPD/obstructive sleep apnea continue CPAP at night and DuoNeb.  #4 type 2 diabetes Metformin on hold continue sliding scale  #5 acute renal failureresolved with IV hydration creatinine 1.0 todaycontinue IV hydration labs pending for today.  #6 glaucoma continue eyedrops Timoptic and Xalatan  #7 microscopic hematuria patient has nonobstructive renal stones could be the cause will need outpatient follow-up with urology.  #8 hypokalemia resolved   DVT prophylaxis:Heparin   code Status:Full code  family Communication:dw daughter Disposition Plan:Pending clinical improvement Consultants:[facility] , General surgery  Procedures:Echo 09/01/2019 Antimicrobials:Zosyn  Nutrition Problem: Inadequate oral intake Etiology: acute illness(abdominal pain, nausea, vomiting, possible common  bile duct obstruction)  Signs/Symptoms: (per chart, clear liquid diet)    Interventions: Refer to RD note for recommendations  Estimated body mass index is 31.93 kg/m as calculated from the following:   Height as of this encounter: _0  (1.93 m).   Weight as of this encounter: 119 kg.   Subjective:  She is to go home feels like he is just laying in bed and nothing is being done Objective: Abdominal pain better Vitals:   09/05/19 0400 09/05/19 0500 09/05/19 0647 09/05/19 0818  BP: 106/66   105/75  Pulse: 94   69  Resp: 16   16  Temp:   98.1 F (36.7 C)   TempSrc:   Oral   SpO2: 94%   100%  Weight:  119 kg    Height:        Intake/Output Summary (Last 24 hours) at 09/05/2019 1209 Last data filed at 09/05/2019 1100 Gross per 24 hour  Intake 240 ml  Output -  Net 240 ml   Filed Weights   09/03/19 0500 09/04/19 0500 09/05/19 0500  Weight: 117 kg 117.2 kg 119 kg    Examination:  General exam: Appears calm and comfortable  Respiratory system: Clear to auscultation. Respiratory effort normal. Cardiovascular system: S1 & S2 heard, RRR. No JVD, murmurs, rubs, gallops or clicks. No pedal edema. Gastrointestinal system: Abdomen is nondistended, soft and nontender. No organomegaly or masses felt. Normal bowel sounds heard. Central nervous system: Alert and oriented. No focal neurological deficits. Extremities: Symmetric 5 x 5 power. Skin: No rashes, lesions or ulcers Psychiatry: Judgement and insight appear normal. Mood & affect appropriate.     Data Reviewed: I have personally reviewed following labs and imaging studies  CBC: Recent Labs  Lab 08/31/19 1523 09/01/19 1102 09/03/19 0517 09/04/19 0159 09/05/19 0202  WBC 14.0* 14.2* 9.1 9.1 8.9  NEUTROABS 11.8*  --   --   --   --   HGB 16.1 13.7 12.1* 11.6* 11.3*  HCT 48.2 42.8 35.7* 35.4* 34.8*  MCV 93.2 94.7 93.2 94.4 93.8  PLT 175 132* 94* 129* 017*   Basic Metabolic Panel: Recent Labs  Lab  09/01/19 1103 09/02/19 0210 09/03/19 0517 09/04/19 0159 09/05/19 0202  NA 139 135 137 132* 141  K 3.7 3.2* 3.3* 3.7 3.6  CL 105 105 105 102 107  CO2 _1 GLUCOSE 112* 117* 97 95 100*  BUN _2 CREATININE 1.43* 1.00 1.05 1.05 1.03  CALCIUM 8.4* 7.6* 7.8* 7.9* 8.8*  MG  --   --  2.0  --   --    GFR: Estimated Creatinine Clearance: 91.4 mL/min (by C-G formula based on SCr of 1.03 mg/dL). Liver Function Tests: Recent Labs  Lab 09/01/19 1103 09/02/19 0210 09/03/19 0517 09/04/19 0159 09/05/19 0202  AST 208* 110* 65* 60* 63*  ALT 446* 292* 215* 180* 153*  ALKPHOS 165* 139* 179* 194* 171*  BILITOT 4.2* 4.1* 4.9* 3.8* 2.7*  PROT 6.6 5.5* 6.0* 6.0* 5.9*  ALBUMIN 3.6 2.8* 2.8* 2.8* 2.9*   Recent Labs  Lab 08/31/19 1523 09/04/19 0159  LIPASE 23 28   No results for input(s): AMMONIA in the last 168 hours. Coagulation Profile: Recent Labs  Lab 08/31/19 1603  INR 1.1   Cardiac Enzymes: No results for input(s): CKTOTAL, CKMB, CKMBINDEX, TROPONINI in the last 168 hours. BNP (last 3 results) No results for input(s): PROBNP in the last 8760 hours. HbA1C: No results for input(s): HGBA1C in the  last 72 hours. CBG: Recent Labs  Lab 09/04/19 1652 09/04/19 1945 09/04/19 2315 09/05/19 0328 09/05/19 0728  GLUCAP 106* 122* 132* 103* 103*   Lipid Profile: No results for input(s): CHOL, HDL, LDLCALC, TRIG, CHOLHDL, LDLDIRECT in the last 72 hours. Thyroid Function Tests: Recent Labs    09/03/19 1346  TSH 3.066   Anemia Panel: No results for input(s): VITAMINB12, FOLATE, FERRITIN, TIBC, IRON, RETICCTPCT in the last 72 hours. Sepsis Labs: Recent Labs  Lab 08/31/19 1603 08/31/19 1739  LATICACIDVEN 1.9 1.8    Recent Results (from the past 240 hour(s))  Blood Culture (routine x 2)     Status: None   Collection Time: 08/31/19  4:03 PM   Specimen: BLOOD RIGHT ARM  Result Value Ref Range Status   Specimen Description   Final    BLOOD RIGHT ARM  BOTTLES DRAWN AEROBIC AND ANAEROBIC   Special Requests Blood Culture adequate volume  Final   Culture   Final    NO GROWTH 5 DAYS Performed at [facility], [address], [place], [place] [address]    Report Status 09/05/2019 FINAL  Final  Blood Culture (routine x 2)     Status: None   Collection Time: 08/31/19  4:13 PM   Specimen: BLOOD RIGHT HAND  Result Value Ref Range Status   Specimen Description   Final    BLOOD RIGHT HAND BOTTLES DRAWN AEROBIC AND ANAEROBIC   Special Requests Blood Culture adequate volume  Final   Culture   Final    NO GROWTH 5 DAYS Performed at [facility], [address] [place], [place] [address]    Report Status 09/05/2019 FINAL  Final  Urine culture     Status: Abnormal   Collection Time: 08/31/19  6:42 PM   Specimen: In/Out Cath Urine  Result Value Ref Range Status   Specimen Description   Final    IN/OUT CATH URINE Performed at [facility], [address], [place], [place] [address]    Special Requests   Final    NONE Performed at [facility], [address] [place], [place] [address]    Culture MULTIPLE SPECIES PRESENT, [place] (A)  Final   Report Status 09/02/2019 FINAL  Final  SARS Coronavirus 2 [facility] order, Performed in [facility] hospital lab) Nasopharyngeal Urine, Clean Catch     Status: None   Collection Time: 08/31/19  6:42 PM   Specimen: Urine, Clean Catch; Nasopharyngeal  Result Value Ref Range Status   SARS Coronavirus 2 NEGATIVE NEGATIVE Final    Comment: (NOTE) If result is NEGATIVE SARS-CoV-2 target nucleic acids are NOT DETECTED. The SARS-CoV-2 RNA is generally detectable in upper and lower  respiratory specimens during the acute phase of infection. The lowest  concentration of SARS-CoV-2 viral copies this assay can detect is 250  copies / mL. A negative result does not preclude SARS-CoV-2 infection  and should not be used as the sole basis for treatment or other  patient management decisions.  A negative  result may occur with  improper specimen collection / handling, submission of specimen other  than nasopharyngeal swab, presence of viral mutation(s) within the  areas targeted by this assay, and inadequate number of viral copies  (<250 copies / mL). A negative result must be combined with clinical  observations, patient history, and epidemiological information. If result is POSITIVE SARS-CoV-2 target nucleic acids are DETECTED. The SARS-CoV-2 RNA is generally detectable in upper and lower  respiratory specimens dur ing the acute  phase of infection.  Positive  results are indicative of active infection with SARS-CoV-2.  Clinical  correlation with patient history and other diagnostic information is  necessary to determine patient infection status.  Positive results do  not rule out bacterial infection or co-infection with other viruses. If result is PRESUMPTIVE POSTIVE SARS-CoV-2 nucleic acids MAY BE PRESENT.   A presumptive positive result was obtained on the submitted specimen  and confirmed on repeat testing.  While 2019 novel coronavirus  (SARS-CoV-2) nucleic acids may be present in the submitted sample  additional confirmatory testing may be necessary for epidemiological  and / or clinical management purposes  to differentiate between  SARS-CoV-2 and other Sarbecovirus currently known to infect humans.  If clinically indicated additional testing with an alternate test  methodology [phone]) is advised. The SARS-CoV-2 RNA is generally  detectable in upper and lower respiratory sp ecimens during the acute  phase of infection. The expected result is Negative. Fact Sheet for Patients:  StrictlyIdeas.no Fact Sheet for Healthcare Providers: BankingDealers.co.za This test is not yet approved or cleared by the [place] FDA and has been authorized for detection and/or diagnosis of SARS-CoV-2 by FDA under an Emergency Use Authorization  (EUA).  This EUA will remain in effect (meaning this test can be used) for the duration of the COVID-19 declaration under Section 564(b)(1) of the Act, 21 U.S.C. section 360bbb-3(b)(1), unless the authorization is terminated or revoked sooner. Performed at [facility], [address] [place], [place] [address]   MRSA PCR Screening     Status: None   Collection Time: 08/31/19 11:08 PM   Specimen: Nasal Mucosa; Nasopharyngeal  Result Value Ref Range Status   MRSA by PCR NEGATIVE NEGATIVE Final    Comment:        The GeneXpert MRSA Assay (FDA approved for NASAL specimens only), is one component of a comprehensive MRSA colonization surveillance program. It is not intended to diagnose MRSA infection nor to guide or monitor treatment for MRSA infections. Performed at [facility], [place] [address] [place], [place] [address]   Surgical pcr screen     Status: None   Collection Time: 09/02/19  1:36 AM   Specimen: Nasal Mucosa; Nasal Swab  Result Value Ref Range Status   MRSA, PCR NEGATIVE NEGATIVE Final   Staphylococcus aureus NEGATIVE NEGATIVE Final    Comment: (NOTE) The Xpert SA Assay (FDA approved for NASAL specimens in patients 45 years of age and older), is one component of a comprehensive surveillance program. It is not intended to diagnose infection nor to guide or monitor treatment. Performed at [facility], [place] [address] [place],  [address]          Radiology Studies: Nm Myocar Multi W/spect W/wall Motion / Ef  Result Date: 09/04/2019 CLINICAL DATA:  Preoperative cardiac evaluation. Atrial fibrillation, diabetes, obesity and COPD. Positive family history of coronary artery disease. EXAM: MYOCARDIAL IMAGING WITH SPECT (REST AND PHARMACOLOGIC-STRESS) GATED LEFT VENTRICULAR WALL MOTION STUDY LEFT VENTRICULAR EJECTION FRACTION TECHNIQUE: Standard myocardial SPECT imaging was performed after resting intravenous injection of 10  mCi Tc-86mtetrofosmin. Subsequently, intravenous infusion of Lexiscan was performed under the supervision of the Cardiology staff. At peak effect of the drug, 30 mCi Tc-921metrofosmin was injected intravenously and standard myocardial SPECT imaging was performed. Quantitative gated imaging was also performed to evaluate left ventricular wall motion, and estimate left ventricular ejection fraction. COMPARISON:  None. FINDINGS: Perfusion: A moderate area of decreased myocardial activity is  seen in the mid and distal inferior, inferolateral, and inferoapical walls on stress images, which shows reversibility on resting images. Wall Motion: Normal left ventricular wall motion. No left ventricular dilation. Left Ventricular Ejection Fraction: 50 % End diastolic volume 300 ml End systolic volume 67 ml IMPRESSION: 1. Moderate reversible myocardial perfusion defect in the inferior, inferolateral, and inferoapical walls of the left ventricle, consistent with inducible myocardial ischemia. 2. Normal left ventricular wall motion. 3. Left ventricular ejection fraction 50% 4. Non invasive risk stratification*: Intermediate *2012 Appropriate Use Criteria for Coronary Revascularization Focused Update: J Am Coll Cardiol. 9233;00(7):622-633. http://content.airportbarriers.com.aspx?articleid=[email or web address] Electronically Signed   By: Marlaine Hind M.D.   On: 09/04/2019 13:54   Dg Abd 2 Views  Result Date: 09/03/2019 CLINICAL DATA:  Epigastric pain, check biliary stent EXAM: ABDOMEN - 2 VIEW COMPARISON:  09/02/2019 FINDINGS: Scattered large and small bowel gas is noted. Biliary stent is again seen and stable. No free air is noted. No obstructive changes are noted. 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 IMPRESSION: Biliary stent in place.  No acute abnormality noted. Electronically Signed   By: Inez Catalina M.D.   On: 09/03/2019 14:25        Scheduled Meds: . Chlorhexidine Gluconate Cloth  6 each Topical Daily  . diltiazem  120 mg Oral  Q breakfast  . docusate sodium  100 mg Oral BID  . feeding supplement  1 Container Oral TID WC  . indomethacin  100 mg Rectal Once  . insulin aspart  0-9 Units Subcutaneous Q4H  . latanoprost  1 drop Both Eyes Q2200  . mouth rinse  15 mL Mouth Rinse BID  . metoprolol tartrate  50 mg Oral BID  . pantoprazole (PROTONIX) IV  40 mg Intravenous QHS  . polyethylene glycol  17 g Oral Daily  . timolol  1 drop Both Eyes Daily   Continuous Infusions: . sodium chloride Stopped (09/02/19 1700)  . heparin 2,050 Units/hr (09/05/19 0004)  . piperacillin-tazobactam (ZOSYN)  IV Stopped (09/05/19 0920)     LOS: 5 days     Georgette Shell, MD [facility] If 7PM-7AM, please contact night-coverage [email or web address] Password [facility] 09/05/2019, 12:09 PM

## 2019-09-06 ENCOUNTER — Inpatient Hospital Stay (HOSPITAL_COMMUNITY): Payer: Medicare Other

## 2019-09-06 LAB — COMPREHENSIVE METABOLIC PANEL
ALT: 142 U/L — ABNORMAL HIGH (ref 0–44)
AST: 67 U/L — ABNORMAL HIGH (ref 15–41)
Albumin: 2.9 g/dL — ABNORMAL LOW (ref 3.5–5.0)
Alkaline Phosphatase: 174 U/L — ABNORMAL HIGH (ref 38–126)
Anion gap: 12 (ref 5–15)
BUN: 15 mg/dL (ref 8–23)
CO2: 23 mmol/L (ref 22–32)
Calcium: 8.9 mg/dL (ref 8.9–10.3)
Chloride: 105 mmol/L (ref 98–111)
Creatinine, Ser: 1.01 mg/dL (ref 0.61–1.24)
GFR calc Af Amer: >60 mL/min (ref >60)
GFR calc non Af Amer: >60 mL/min (ref >60)
Glucose, Bld: 101 mg/dL — ABNORMAL HIGH (ref 70–99)
Potassium: 3.8 mmol/L (ref 3.5–5.1)
Sodium: 140 mmol/L (ref 135–145)
Total Bilirubin: 1.6 mg/dL — ABNORMAL HIGH (ref 0.3–1.2)
Total Protein: 6.3 g/dL — ABNORMAL LOW (ref 6.5–8.1)

## 2019-09-06 LAB — CBC
HCT: 35.5 % — ABNORMAL LOW (ref 39.0–52.0)
Hemoglobin: 11.4 g/dL — ABNORMAL LOW (ref 13.0–17.0)
MCH: 30.5 pg (ref 26.0–34.0)
MCHC: 32.1 g/dL (ref 30.0–36.0)
MCV: 94.9 fL (ref 80.0–100.0)
Platelets: 172 10*3/uL (ref 150–400)
RBC: 3.74 MIL/uL — ABNORMAL LOW (ref 4.22–5.81)
RDW: 15.5 % (ref 11.5–15.5)
WBC: 11.6 10*3/uL — ABNORMAL HIGH (ref 4.0–10.5)
nRBC: 0 % (ref 0.0–0.2)

## 2019-09-06 LAB — GLUCOSE, CAPILLARY
Glucose-Capillary: 105 mg/dL — ABNORMAL HIGH (ref 70–99)
Glucose-Capillary: 108 mg/dL — ABNORMAL HIGH (ref 70–99)
Glucose-Capillary: 121 mg/dL — ABNORMAL HIGH (ref 70–99)
Glucose-Capillary: 94 mg/dL (ref 70–99)

## 2019-09-06 LAB — HEPARIN LEVEL (UNFRACTIONATED): Heparin Unfractionated: 0.51 IU/mL (ref 0.30–0.70)

## 2019-09-06 MED ORDER — DILTIAZEM HCL ER COATED BEADS 120 MG PO CP24: 120.0000 mg | ORAL_CAPSULE | Freq: Every day | ORAL | 1 refills | Status: AC

## 2019-09-06 MED ORDER — RIVAROXABAN 20 MG PO TABS
20.0000 mg | ORAL_TABLET | Freq: Every day | ORAL | Status: DC
  Administered 2019-09-06: 20 mg via ORAL
  Filled 2019-09-06: qty 1

## 2019-09-06 MED ORDER — RIVAROXABAN 20 MG PO TABS: 20.0000 mg | ORAL_TABLET | Freq: Every day | ORAL | 1 refills | Status: DC

## 2019-09-06 MED ORDER — METOPROLOL TARTRATE 50 MG PO TABS: 50.0000 mg | ORAL_TABLET | Freq: Two times a day (BID) | ORAL | 1 refills | Status: DC

## 2019-09-06 MED ORDER — HEPARIN (PORCINE) 25000 UT/250ML-% IV SOLN: 1950.0000 [IU]/h | INTRAVENOUS | Status: DC

## 2019-09-06 NOTE — Evaluation (Signed)
Physical Therapy Evaluation Patient Details Name: [name] MRN: [id number] DOB: 02-04-1946 Today's Date: 09/06/2019   History of Present Illness  73 y.o. male, w [name], NonHodgkins Lymphoma,  Dm2, Pafib, Tobacco use,  Copd, OSA on Cpap, presents with c/o abdominal pain x 4 days, worse with food, slight n/v, slight fever, chills. Dx of sepsis, cholangitis, s/p ERCP 09/02/19.  Clinical Impression  Pt is independent with mobility and is ready to DC home from PT standpoint. He ambulated 270' without an assistive device, no loss of balance. No further PT indicated, will sign off.     Follow Up Recommendations No PT follow up    Equipment Recommendations  None recommended by PT    Recommendations for Other Services       Precautions / Restrictions Precautions Precautions: None Restrictions Weight Bearing Restrictions: No      Mobility  Bed Mobility Overal bed mobility: Independent                Transfers Overall transfer level: Independent                  Ambulation/Gait Ambulation/Gait assistance: Independent Gait Distance (Feet): 270 Feet Assistive device: None Gait Pattern/deviations: WFL(Within Functional Limits) Gait velocity: WNL   General Gait Details: steady, no loss of balance  Stairs            Wheelchair Mobility    Modified Rankin (Stroke Patients Only)       Balance Overall balance assessment: Independent                                           Pertinent Vitals/Pain Pain Assessment: No/denies pain    Home Living Family/patient expects to be discharged to:: Private residence Living Arrangements: Alone Available Help at Discharge: Family;Available PRN/intermittently           Home Equipment: Kasandra Knudsen - single point      Prior Function Level of Independence: Independent               Hand Dominance        Extremity/Trunk Assessment   Upper Extremity Assessment Upper Extremity Assessment:  Overall WFL for tasks assessed    Lower Extremity Assessment Lower Extremity Assessment: Overall WFL for tasks assessed       Communication   Communication: No difficulties  Cognition Arousal/Alertness: Awake/alert Behavior During Therapy: WFL for tasks assessed/performed Overall Cognitive Status: Within Functional Limits for tasks assessed                                        General Comments      Exercises     Assessment/Plan    PT Assessment Patent does not need any further PT services  PT Problem List         PT Treatment Interventions      PT Goals (Current goals can be found in the Care Plan section)  Acute Rehab PT Goals Patient Stated Goal: likes to tinker with engines PT Goal Formulation: All assessment and education complete, DC therapy    Frequency     Barriers to discharge        Co-evaluation               AM-PAC PT "6 Clicks" Mobility  Outcome  Measure Help needed turning from your back to your side while in a flat bed without using bedrails?: None Help needed moving from lying on your back to sitting on the side of a flat bed without using bedrails?: None Help needed moving to and from a bed to a chair (including a wheelchair)?: None Help needed standing up from a chair using your arms (e.g., wheelchair or bedside chair)?: None Help needed to walk in hospital room?: None Help needed climbing 3-5 steps with a railing? : None 6 Click Score: 24    End of Session   Activity Tolerance: Patient tolerated treatment well Patient left: in bed;with family/visitor present;with call bell/phone within reach Nurse Communication: Mobility status      Time: [id number] PT Time Calculation (min) (ACUTE ONLY): 10 min   Charges:   PT Evaluation $PT Eval Low Complexity: 1 Low          Philomena Doheny PT 09/06/2019  Acute Rehabilitation Services Pager [phone] Office [phone]

## 2019-09-06 NOTE — Progress Notes (Signed)
[facility] Progress Note  4 Days Post-Op  Subjective:  Feels hungry. Tolerating solid food.  Wants to go home  Objective: Vital signs in last 24 hours: Temp:  [97.7 F (36.5 C)-98.7 F (37.1 C)] 97.7 F (36.5 C) (09/12 0430) Pulse Rate:  [56-80] 80 (09/12 0911) Resp:  [16-20] 20 (09/12 0430) BP: (115-121)/(62-74) 115/62 (09/12 0430) SpO2:  [96 %-100 %] 100 % (09/12 0430) Weight:  [115.8 kg] 115.8 kg (09/12 0430) Last BM Date: 09/05/19  Intake/Output from previous day: 09/11 0701 - 09/12 0700 In: 1761.2 [P.O.:660; I.V.:834; IV Piggyback:267.2] Out: -  Intake/Output this shift: No intake/output data recorded.  PE: Gen: Alert, NAD, pleasant Abd: Soft,protuberant, mild distension,no abdominal tenderness [id number] soft and nontender Psych: A&Ox3  Skin: no rashes noted, warm and dry  Lab Results:  Recent Labs    09/05/19 0202 09/06/19 0356  WBC 8.9 11.6*  HGB 11.3* 11.4*  HCT 34.8* 35.5*  PLT 149* 172   BMET Recent Labs    09/05/19 0202 09/06/19 0356  NA 141 140  K 3.6 3.8  CL 107 105  CO2 23 23  GLUCOSE 100* 101*  BUN 12 15  CREATININE 1.03 1.01  CALCIUM 8.8* 8.9   PT/INR No results for input(s): LABPROT, INR in the last 72 hours. CMP     Component Value Date/Time   NA 140 09/06/2019 0356   NA 139 11/14/2011 0919   K 3.8 09/06/2019 0356   K 4.4 11/14/2011 0919   CL 105 09/06/2019 0356   CL 102 11/14/2011 0919   CO2 23 09/06/2019 0356   CO2 28 11/14/2011 0919   GLUCOSE 101 (H) 09/06/2019 0356   GLUCOSE 100 11/14/2011 0919   BUN 15 09/06/2019 0356   BUN 17 11/14/2011 0919   CREATININE 1.01 09/06/2019 0356   CREATININE 0.9 11/14/2011 0919   CALCIUM 8.9 09/06/2019 0356   CALCIUM 8.6 11/14/2011 0919   PROT 6.3 (L) 09/06/2019 0356   PROT 7.1 11/14/2011 0919   ALBUMIN 2.9 (L) 09/06/2019 0356   ALBUMIN 4.0 11/14/2011 0919   AST 67 (H) 09/06/2019 0356   AST 22 11/14/2011 0919   ALT 142 (H) 09/06/2019 0356   ALT 23 11/14/2011  0919   ALKPHOS 174 (H) 09/06/2019 0356   ALKPHOS 55 11/14/2011 0919   BILITOT 1.6 (H) 09/06/2019 0356   BILITOT 0.50 11/14/2011 0919   GFRNONAA >60 09/06/2019 0356   GFRAA >60 09/06/2019 0356   Lipase     Component Value Date/Time   LIPASE 28 09/04/2019 0159       Studies/Results: Nm Myocar Multi W/spect W/wall Motion / Ef  Result Date: 09/04/2019 CLINICAL DATA:  Preoperative cardiac evaluation. Atrial fibrillation, diabetes, obesity and COPD. Positive family history of coronary artery disease. EXAM: MYOCARDIAL IMAGING WITH SPECT (REST AND PHARMACOLOGIC-STRESS) GATED LEFT VENTRICULAR WALL MOTION STUDY LEFT VENTRICULAR EJECTION FRACTION TECHNIQUE: Standard myocardial SPECT imaging was performed after resting intravenous injection of 10 mCi Tc-45m tetrofosmin. Subsequently, intravenous infusion of Lexiscan was performed under the supervision of the Cardiology staff. At peak effect of the drug, 30 mCi Tc-78m tetrofosmin was injected intravenously and standard myocardial SPECT imaging was performed. Quantitative gated imaging was also performed to evaluate left ventricular wall motion, and estimate left ventricular ejection fraction. COMPARISON:  None. FINDINGS: Perfusion: A moderate area of decreased myocardial activity is seen in the mid and distal inferior, inferolateral, and inferoapical walls on stress images, which shows reversibility on resting images. Wall Motion: Normal left ventricular wall motion. No  left ventricular dilation. Left Ventricular Ejection Fraction: 50 % End diastolic volume 123456 ml End systolic volume 67 ml IMPRESSION: 1. Moderate reversible myocardial perfusion defect in the inferior, inferolateral, and inferoapical walls of the left ventricle, consistent with inducible myocardial ischemia. 2. Normal left ventricular wall motion. 3. Left ventricular ejection fraction 50% 4. Non invasive risk stratification*: Intermediate *2012 Appropriate Use Criteria for Coronary  Revascularization Focused Update: J Am Coll Cardiol. [id number]. http://content.[email or web address].aspx?articleid=[email or web address] Electronically Signed   By: Marlaine Hind M.D.   On: 09/04/2019 13:54   Dg Chest Port 1 View  Result Date: 09/06/2019 CLINICAL DATA:  73 year old male with history of shortness of breath. EXAM: PORTABLE CHEST 1 VIEW COMPARISON:  Chest x-ray 08/31/2019. FINDINGS: Lung volumes are normal. Mild diffuse peribronchial cuffing. No consolidative airspace disease. No pleural effusions. No pneumothorax. No pulmonary nodule or mass noted. Pulmonary vasculature and the cardiomediastinal silhouette are within normal limits. IMPRESSION: 1. Mild diffuse peribronchial cuffing which may suggest an acute bronchitis. Electronically Signed   By: Vinnie Langton M.D.   On: 09/06/2019 07:01    Anti-infectives: Anti-infectives (From admission, onward)   Start     Dose/Rate Route Frequency Ordered Stop   09/01/19 0700  vancomycin (VANCOCIN) IVPB 750 mg/150 ml premix  Status:  Discontinued     750 mg 150 mL/hr over 60 Minutes Intravenous Every 12 hours 08/31/19 2056 09/01/19 1016   08/31/19 2200  piperacillin-tazobactam (ZOSYN) IVPB 3.375 g     3.375 g 12.5 mL/hr over 240 Minutes Intravenous Every 8 hours 08/31/19 2055     08/31/19 2100  vancomycin (VANCOCIN) IVPB 1000 mg/200 mL premix  Status:  Discontinued     1,000 mg 200 mL/hr over 60 Minutes Intravenous Every 1 hr x 2 08/31/19 2055 08/31/19 2259   08/31/19 1545  cefTRIAXone (ROCEPHIN) 2 g in sodium chloride 0.9 % 100 mL IVPB     2 g 200 mL/hr over 30 Minutes Intravenous  Once 08/31/19 1541 08/31/19 1631   08/31/19 1545  metroNIDAZOLE (FLAGYL) IVPB 500 mg     500 mg 100 mL/hr over 60 Minutes Intravenous  Once 08/31/19 1541 08/31/19 1650       Assessment/Plan 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 Cardiology recommends starting [facility] at discharge COPDon 2L prn OSAon  CPAP DM HTN  Cholangitis Choledocholithiasis Cholecystitis - unable to visualize cystic duct with cholangiogram during ERCP -S/pERCP 9/8:had sphincterotomy, balloon dilation, stone extraction, stent placement with drainage of bile / puss - Per Dr. Hassell Done, would be beneficial to delay and perform outpatient. This way he will be over the acute infection of cholangitis and he can work to improve his nutritional status as well as physical strength. Patient agrees. Once medically stable he may be discharged home and follow up with Dr. Hassell Done can be arranged in a few weeks.  ID -Zosyn9/6>> VTE -SCDs,IV heparin FEN -IVF, soft diet, Boost Foley - none Follow up - Dr. Hassell Done, GI   LOS: 6 days    Rosario Adie, MD  [facility]

## 2019-09-06 NOTE — Progress Notes (Signed)
Pt discharged to home, instructions reviewed with patient and family. Pt d/c on anticoagulant Xalrelto, teaching completed by pharmacy. Pt and daughter acknowledged understanding. SRP, RN

## 2019-09-06 NOTE — Progress Notes (Signed)
[place] for heparin Indication: atrial fibrillation  Allergies  Allergen Reactions  . Codeine Other (See Comments)    REACTION: Shakes   . Pred Forte [Prednisolone Acetate] Other (See Comments)    Severe burning   Patient Measurements: Height: 6\' 4"  (193 cm) Weight: 255 lb 4.8 oz (115.8 kg) IBW/kg (Calculated) : 86.8 Heparin Dosing Weight: 109 kg  Vital Signs: Temp: 97.7 F (36.5 C) (09/12 0430) Temp Source: Oral (09/12 0430) BP: 115/62 (09/12 0430) Pulse Rate: 69 (09/12 0430)  Labs: Recent Labs    09/04/19 0159 09/04/19 0826 09/05/19 0202 09/06/19 0356  HGB 11.6*  --  11.3* 11.4*  HCT 35.4*  --  34.8* 35.5*  PLT 129*  --  149* 172  HEPARINUNFRC  --  0.39 0.38 0.51  CREATININE 1.05  --  1.03  --    Estimated Creatinine Clearance: 90.2 mL/min (by C-G formula based on SCr of 1.03 mg/dL).  Medical History: Past Medical History:  Diagnosis Date  . Atrial fibrillation ([place])   . Avascular necrosis of bones of both hips ([place])   . 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 Cancer ([place])   . Colonic polyp   . Complication of anesthesia    hard to wake up, "felt like died and revived me"  . COPD (chronic obstructive pulmonary disease) ([facility])    mild PFT abnormalities; normal ABG  . Degenerative disc disease, lumbar    HNP of the LS spine  . Diabetes mellitus    type 2  . GERD (gastroesophageal reflux disease)   . Glaucoma   . H/O hiatal hernia   . Hx MRSA infection 2010   sith splenic abcess  . Non Hodgkin's lymphoma ([place]) 06/2009   chemotherapy until 11/2010  . Obesity   . Obstructive sleep apnea    treated with CPAP  . Orthostatic hypotension    lightheadness; no definate loss of consciousness  . Pedal edema   . PONV (postoperative nausea and vomiting)   . Splenic abscess 07/2009  . Tobacco abuse    45 pack years   Assessment: Patient admitted with abdominal pain - found to have CBD stones and biliary obstruction.  Heparin drip initiated on admission for atrial fibrillation -  patient was not taking anticoagulants PTA with hx of Afib  Baseline labs:  Hgb 16.1,Plt 175, INR 1.1, APTT 30 seconds  9/7 Heparin 4000 unit bolus, infusion at 1400 units/hr 1100 Hep level = 0.31 units/ml, in therapeutic range 1837 Hep level = 0.24, rate increased to 1600 units/hr,off at 1:30am 9/8 for ERCP 9/8: Heparin resumed 8pm at 1600 units/hr Cards cleared for surgery, but no surgery this admit  Today, 09/06/19  Hep level = 0.51 units/ml on 2050 units/hr, in therapeutic range Hgb 11.4, Plt 172 (increased) No sign bleed, no problems with infusion  Goal of Therapy:  Heparin level 0.3-0.7 units/ml Monitor platelets by anticoagulation protocol: Yes   Plan:  Reduce Heparin to 1950 units/hr empirically Daily CBC and Hep level Case manager to price Xarelto and Eliquis for Afib Monitor for signs/symptoms of bleeding or thrombosis  Minda Ditto, PharmD 09/06/2019,7:22 AM

## 2019-09-06 NOTE — Progress Notes (Addendum)
Pt called out stating "I am SOB and hurting in top of my stomach" after assessment (RR 32, HR 60, Lung sounds diminished) I ask pt why he took off CPAP he said "I cant breathe with it on or off" I applied [place] 2L. Gave pt oxy IR, call RT, and on call provider. Made provider aware of tele calling and saying pt had 2.11 sec pause.

## 2019-09-06 NOTE — Discharge Summary (Addendum)
Physician Discharge Summary  [name] SLH:[id number] DOB: 10/05/1946 DOA: 08/31/2019  PCP: Sinda Du, MD  Admit date: 08/31/2019 Discharge date: 09/06/2019  Admitted From: Home Disposition: Home  Recommendations for Outpatient Follow-up:  1. Follow up with PCP in 1-2 weeks 2. Please obtain BMP/CBC in one week 3. Follow-up with surgery Dr. Hassell Done 4. Follow-up with cardiology Dr. Einar Gip Home Health: None Equipment/Devices: None  Discharge Condition stable CODE STATUS: Full code Diet recommendation cardiac Brief/Interim Summary:73 y.o.male,w Gerd, NonHodgkins Lymphoma, Dm2, Pafib, Tobacco use, Copd, OSA on Cpap, presents with c/o abdominal pain x 4 days, worse with food, slight n/v, slight fever, chills. Pt denies cough, cp, palp, sob, diarrhea, brbpr, dysuria. Pt notes that urine is darker than normal.   In ED,   CT abd/ pelvis Musculoskeletal: Left hip prosthesis. 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 Lumbar and lower thoracic spine degenerative changes with a stable 25% L3 status vertebral compression deformity with no acute fracture lines or bony retropulsion.  IMPRESSION: 1. Interval mild intrahepatic biliary ductal dilatation and marked dilatation of the proximal common duct with probable small noncalcified stones in the distal common duct. 2. Probable cholelithiasis. 3. 4 mm nonobstructing lower pole right renal calculus. 4. Extensive colonic diverticulosis. 5. Interval repair of the previously demonstrated left inguinal hernia. 6. Small right inguinal hernia containing fat. 7. Right femoral head avascular necrosis and moderate right hip degenerative changes.  CXR IMPRESSION: No acute abnormalities. Wbc 14.0, Hgb 16.1, Plt 175 Na 137, K 3.8 Bun 21, Creatinine 1.31 Alb 4.1, Ast 359, Alt 610, Alk phos 169 . T. Bili 3.5 Lipase 23 Lactic acid 1.9  PTT 30 INR 1.1  Urinalysis SG  >1.046 Rbc 11-20 Wbc 0-5   Blood culture x2  covid-19 negative  Pt will be admitted for sepsis (Fever, tachycardia  Discharge Diagnoses:  Principal Problem:   Sepsis ([place]) Active Problems:   Diabetes ([place])   TOBACCO ABUSE   Atrial fibrillation with RVR (H[facility])   COPD (chronic obstructive pulmonary disease) (H[facility])   Obstructive sleep apnea   ARF (acute renal failure) (H[facility])   Abnormal liver function   Common biliary duct obstruction   Acute cholangitis    #1 sepsis present on admissionsecondary to cholangitis.-patient was admitted with fever tachycardia and hypotension likely secondary to cholangitis-with right upper quadrant pain with CT findings consistent with biliary ductal dilatation and marked dilatation of the proximal common duct and with probable small noncalcified stones in the distal common duct.He is status post ERCP 09/02/2019. All intrahepatic ducts were severely dilated with a stone causing obstruction, choledocholithiasis and cholangitis and biliary sludge was found, complete removal was accomplished by biliary sphincterotomy and sphincteroplasty and balloon sweeping.    Patient was seen in consultation by general surgery Dr. Hassell Done.  Plan is to have a cholecystectomy done as an outpatient.  Patient was hesitant to have surgery done during this admission.  Patient to follow-up with general surgery after discharge.  Patient was seen by physical therapy recommends no home health PT.    #2 paroxysmal A. fib with RVR-rate controlled with Cardizem and metoprolol.  He was treated with IV heparin.  He will be discharged on Xarelto 20 mg daily.  patient had a Lexiscan which was intermediate patient will follow-up with Dr. Einar Gip.   #3 COPD/obstructive sleep apnea continue CPAP at night and DuoNeb.  #4 type 2 diabetes continue metformin.    #5 acute renal failureresolved with IV hydration creatinine  1.01 on the day of discharge.  #6 glaucoma continue eyedrops  Timoptic and Xalatan  #7 microscopic hematuria patient has nonobstructive renal stones could be the cause will need outpatient follow-up with urology.  #8 hypokalemia resolved   Nutrition Problem: Inadequate oral intake Etiology: acute illness(abdominal pain, nausea, vomiting, possible common bile duct obstruction)    Signs/Symptoms: (per chart, clear liquid diet)     Interventions: Refer to RD note for recommendations  Estimated body mass index is 31.08 kg/m as calculated from the following:   Height as of this encounter: '6\' 4"'  (1.93 m).   Weight as of this encounter: 115.8 kg.  Discharge Instructions   Allergies as of 09/06/2019      Reactions   Codeine Other (See Comments)   REACTION: Shakes   Pred Forte [prednisolone Acetate] Other (See Comments)   Severe burning      Medication List    STOP taking these medications   aspirin EC 325 MG tablet     TAKE these medications   atorvastatin 20 MG tablet Commonly known as: LIPITOR Take 20 mg by mouth daily.   diltiazem 120 MG 24 hr capsule Commonly known as: CARDIZEM CD Take 1 capsule (120 mg total) by mouth daily with breakfast. Start taking on: September 07, 2019 What changed:   medication strength  how much to take   ipratropium-albuterol 0.5-2.5 (3) MG/3ML Soln Commonly known as: DUONEB Take 3 mLs by nebulization every 6 (six) hours as needed (for wheezing/shortness of breath).   latanoprost 0.005 % ophthalmic solution Commonly known as: XALATAN Place 1 drop into both eyes daily at 10 pm.   metFORMIN 500 MG tablet Commonly known as: GLUCOPHAGE Take 500 mg by mouth 2 (two) times daily.   methocarbamol 500 MG tablet Commonly known as: ROBAXIN Take 1 tablet (500 mg total) by mouth 2 (two) times daily.   metoprolol tartrate 50 MG tablet Commonly known as: LOPRESSOR Take 1 tablet (50 mg total) by mouth 2 (two) times daily.   senna 8.6 MG tablet Commonly known as: SENOKOT Take 1-2 tablets by  mouth daily as needed for constipation. Constipation   timolol 0.5 % ophthalmic solution Commonly known as: TIMOPTIC Place 1 drop into both eyes daily.      Follow-up Information    Johnathan Hausen, MD. Call.   Specialty: General Surgery Why: We are working on your appointment, please call to confirm. This will be to discuss gallbladder surgery. Please arrive 30 minutes prior to your appointment to check in and fill out paperwork. Bring photo ID and insurance information. Contact information: [place] [address] [phone]        Sinda Du, MD Follow up.   Specialty: Pulmonary Disease Contact information: [address] [place] [address] [phone]        Adrian Prows, MD Follow up.   Specialty: Cardiology Contact information: [place] [address] [phone]          Allergies  Allergen Reactions  . Codeine Other (See Comments)    REACTION: Shakes   . Pred Forte [Prednisolone Acetate] Other (See Comments)    Severe burning    Consultations: General surgery GI and cardiology Dr. Einar Gip  Procedures/Studies: Ct Abdomen Pelvis W Contrast  Result Date: 08/31/2019 CLINICAL DATA:  Acute, diffuse abdominal pain with nausea, vomiting and fever. Chills. History of non-Hodgkin's lymphoma. Smoker. EXAM: CT ABDOMEN AND PELVIS WITH CONTRAST TECHNIQUE: Multidetector CT imaging of the abdomen  and pelvis was performed using the standard protocol following bolus administration of intravenous contrast. CONTRAST:  114m OMNIPAQUE IOHEXOL 300 MG/ML  SOLN COMPARISON:  07/21/2016 FINDINGS: Lower chest: Tiny amount of pericardial fluid with a maximum thickness of 5 mm, without significant change. Normal sized heart. Mild peribronchial thickening at the lung bases. Hepatobiliary: Interval mild intrahepatic biliary ductal dilatation and dilatation of the common duct. The proximal common duct measures 2.0 cm in maximum  diameter. Possible small noncalcified stones in the distal common duct. Probable small noncalcified gallstones in the gallbladder, measuring up to 5 mm in maximum diameter each. No gallbladder wall thickening or pericholecystic fluid. Pancreas: Unremarkable. No pancreatic ductal dilatation or surrounding inflammatory changes. Spleen: Normal in size without focal abnormality. Adrenals/Urinary Tract: Normal appearing adrenal glands. 4 mm lower pole right renal calculus. Unremarkable left kidney, ureters and urinary bladder. Stomach/Bowel: Large number of colonic diverticula without evidence of diverticulitis. Normal appearing stomach, small bowel and appendix. Vascular/Lymphatic: Atheromatous arterial calcifications without aneurysm. No enlarged lymph nodes. Reproductive: Prostate is unremarkable. Other: Interval repair of the previously demonstrated left inguinal hernia. Small right inguinal hernia containing fat. Musculoskeletal: Left hip prosthesis. 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 Lumbar and lower thoracic spine degenerative changes with a stable 25% L3 status vertebral compression deformity with no acute fracture lines or bony retropulsion. IMPRESSION: 1. Interval mild intrahepatic biliary ductal dilatation and marked dilatation of the proximal common duct with probable small noncalcified stones in the distal common duct. 2. Probable cholelithiasis. 3. 4 mm nonobstructing lower pole right renal calculus. 4. Extensive colonic diverticulosis. 5. Interval repair of the previously demonstrated left inguinal hernia. 6. Small right inguinal hernia containing fat. 7. Right femoral head avascular necrosis and moderate right hip degenerative changes. Electronically Signed   By: SClaudie ReveringM.D.   On: 08/31/2019 17:55   Nm Myocar Multi W/spect W/wall Motion / Ef  Result Date: 09/04/2019 CLINICAL DATA:  Preoperative cardiac evaluation. Atrial  fibrillation, diabetes, obesity and COPD. Positive family history of coronary artery disease. EXAM: MYOCARDIAL IMAGING WITH SPECT (REST AND PHARMACOLOGIC-STRESS) GATED LEFT VENTRICULAR WALL MOTION STUDY LEFT VENTRICULAR EJECTION FRACTION TECHNIQUE: Standard myocardial SPECT imaging was performed after resting intravenous injection of 10 mCi [id number]. Subsequently, intravenous infusion of Lexiscan was performed under the supervision of the Cardiology staff. At peak effect of the drug, 30 mCi Tc-9989mtrofosmin was injected intravenously and standard myocardial SPECT imaging was performed. Quantitative gated imaging was also performed to evaluate left ventricular wall motion, and estimate left ventricular ejection fraction. COMPARISON:  None. FINDINGS: Perfusion: A moderate area of decreased myocardial activity is seen in the mid and distal inferior, inferolateral, and inferoapical walls on stress images, which shows reversibility on resting images. Wall Motion: Normal left ventricular wall motion. No left ventricular dilation. Left Ventricular Ejection Fraction: 50 % End diastolic volume [id number] End systolic volume 67 ml IMPRESSION: 1. Moderate reversible myocardial perfusion defect in the inferior, inferolateral, and inferoapical walls of the left ventricle, consistent with inducible myocardial ischemia. 2. Normal left ventricular wall motion. 3. Left ventricular ejection fraction 50% 4. Non invasive risk stratification*: Intermediate *2012 Appropriate Use Criteria for Coronary Revascularization Focused Update: J Am Coll Cardiol. 2013762;83(1):517-616ttp://content.onlairportbarriers.compx?articleid=[email or web address] Electronically Signed   By: JohMarlaine HindD.   On: 09/04/2019 13:54   Dg Chest Port 1 View  Result Date: 09/06/2019 CLINICAL DATA:  [age]ar old male with history of shortness of breath. EXAM: PORTABLE CHEST 1 VIEW COMPARISON:  Chest x-ray 08/31/2019. FINDINGS: Lung volumes are normal. Mild diffuse  peribronchial cuffing. No consolidative airspace disease. No pleural effusions. No pneumothorax. No pulmonary nodule or mass noted. Pulmonary vasculature and the cardiomediastinal silhouette are within normal limits. IMPRESSION: 1. Mild diffuse peribronchial cuffing which may suggest an acute bronchitis. Electronically Signed   By: Vinnie Langton M.D.   On: 09/06/2019 07:01   Dg Chest Port 1 View  Result Date: 08/31/2019 CLINICAL DATA:  Fever EXAM: PORTABLE CHEST 1 VIEW COMPARISON:  Portable exam 1546 hours compared to 12/17/2012 FINDINGS: Rotated to the RIGHT. Normal heart size, mediastinal contours, and pulmonary vascularity. Lungs clear. No infiltrate, pleural effusion or pneumothorax. Osseous structures unremarkable. IMPRESSION: No acute abnormalities. Electronically Signed   By: Lavonia Dana M.D.   On: 08/31/2019 15:59   Dg Ercp  Result Date: 09/02/2019 CLINICAL DATA:  73 year old male with biliary ductal dilatation and probable choledocholithiasis. EXAM: ERCP TECHNIQUE: Multiple spot images obtained with the fluoroscopic device and submitted for interpretation post-procedure. FLUOROSCOPY TIME:  Fluoroscopy Time:  3 minutes 57 seconds Radiation Exposure Index (if provided by the fluoroscopic device): 129 mGy COMPARISON:  None. FINDINGS: These images were submitted for radiologic interpretation only. Please see the procedural report for the amount of contrast and the fluoroscopy time utilized. A total of 17 saved images and 3 cine clips are submitted for review. The images demonstrate a flexible endoscope in the descending duodenum with deep wire cannulation of the intrahepatic ducts followed by sphincterotomy, balloon dilation and balloon sweeping of the common bile duct. On the initial cholangiogram images, there is biliary ductal dilatation and mobile filling defects in the distal common duct most consistent with choledocholithiasis. On the final image, a plastic biliary stent has been placed.  IMPRESSION: 1. Choledocholithiasis. 2. ERCP with sphincterotomy, balloon sweep of the common duct and placement of plastic biliary stent. Electronically Signed   By: Jacqulynn Cadet M.D.   On: 09/02/2019 10:15   Dg Abd 2 Views  Result Date: 09/03/2019 CLINICAL DATA:  Epigastric pain, check biliary stent EXAM: ABDOMEN - 2 VIEW COMPARISON:  09/02/2019 FINDINGS: Scattered large and small bowel gas is noted. Biliary stent is again seen and stable. No free air is noted. No obstructive changes are noted. 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 IMPRESSION: Biliary stent in place.  No acute abnormality noted. Electronically Signed   By: Inez Catalina M.D.   On: 09/03/2019 14:25    (Echo, Carotid, EGD, Colonoscopy, ERCP)    Subjective:  Patient sitting up in bed very anxious and impatient to go home ASAP tolerating diet Discharge Exam: Vitals:   09/06/19 0430 09/06/19 0911  BP: 115/62   Pulse: 69 80  Resp: 20   Temp: 97.7 F (36.5 C)   SpO2: 100%    Vitals:   09/05/19 2042 09/05/19 2057 09/06/19 0430 09/06/19 0911  BP: 121/65  115/62   Pulse: (!) 56 64 69 80  Resp: '18 18 20   ' Temp: 98.7 F (37.1 C)  97.7 F (36.5 C)   TempSrc: Oral  Oral   SpO2: 96% 97% 100%   Weight:   115.8 kg   Height:        General: Pt is alert, awake, not in acute distress Cardiovascular: RRR, S1/S2 +, no rubs, no gallops Respiratory: CTA bilaterally, no wheezing, no rhonchi Abdominal: Soft, NT, ND, bowel sounds + Extremities: no edema, no cyanosis    The results of significant diagnostics from this hospitalization (including imaging, microbiology, ancillary and laboratory) are listed  below for reference.     Microbiology: Recent Results (from the past 240 hour(s))  Blood Culture (routine x 2)     Status: None   Collection Time: 08/31/19  4:03 PM   Specimen: BLOOD RIGHT ARM  Result Value Ref Range Status   Specimen Description   Final    BLOOD RIGHT ARM BOTTLES DRAWN AEROBIC AND ANAEROBIC   Special Requests  Blood Culture adequate volume  Final   Culture   Final    NO GROWTH 5 DAYS Performed at [facility], [address], [place], [place] [address]    Report Status 09/05/2019 FINAL  Final  Blood Culture (routine x 2)     Status: None   Collection Time: 08/31/19  4:13 PM   Specimen: BLOOD RIGHT HAND  Result Value Ref Range Status   Specimen Description   Final    BLOOD RIGHT HAND BOTTLES DRAWN AEROBIC AND ANAEROBIC   Special Requests Blood Culture adequate volume  Final   Culture   Final    NO GROWTH 5 DAYS Performed at [facility], [address] [place], [place] [address]    Report Status 09/05/2019 FINAL  Final  Urine culture     Status: Abnormal   Collection Time: 08/31/19  6:42 PM   Specimen: In/Out Cath Urine  Result Value Ref Range Status   Specimen Description   Final    IN/OUT CATH URINE Performed at [facility], [address], [place], [place] [address]    Special Requests   Final    NONE Performed at [facility], [address] [place], [place] [address]    Culture MULTIPLE SPECIES PRESENT, [place] (A)  Final   Report Status 09/02/2019 FINAL  Final  SARS Coronavirus 2 [facility] order, Performed in [facility] hospital lab) Nasopharyngeal Urine, Clean Catch     Status: None   Collection Time: 08/31/19  6:42 PM   Specimen: Urine, Clean Catch; Nasopharyngeal  Result Value Ref Range Status   SARS Coronavirus 2 NEGATIVE NEGATIVE Final    Comment: (NOTE) If result is NEGATIVE SARS-CoV-2 target nucleic acids are NOT DETECTED. The SARS-CoV-2 RNA is generally detectable in upper and lower  respiratory specimens during the acute phase of infection. The lowest  concentration of SARS-CoV-2 viral copies this assay can detect is 250  copies / mL. A negative result does not preclude SARS-CoV-2 infection  and should not be used as the sole basis for treatment or other  patient management decisions.  A negative result may occur with  improper specimen collection /  handling, submission of specimen other  than nasopharyngeal swab, presence of viral mutation(s) within the  areas targeted by this assay, and inadequate number of viral copies  (<250 copies / mL). A negative result must be combined with clinical  observations, patient history, and epidemiological information. If result is POSITIVE SARS-CoV-2 target nucleic acids are DETECTED. The SARS-CoV-2 RNA is generally detectable in upper and lower  respiratory specimens dur ing the acute phase of infection.  Positive  results are indicative of active infection with SARS-CoV-2.  Clinical  correlation with patient history and other diagnostic information is  necessary to determine patient infection status.  Positive results do  not rule out bacterial infection or co-infection with other viruses. If result is PRESUMPTIVE POSTIVE SARS-CoV-2 nucleic acids MAY BE PRESENT.   A presumptive positive result was obtained on the submitted specimen  and confirmed on repeat testing.  While 2019 novel coronavirus  (SARS-CoV-2) nucleic  acids may be present in the submitted sample  additional confirmatory testing may be necessary for epidemiological  and / or clinical management purposes  to differentiate between  SARS-CoV-2 and other Sarbecovirus currently known to infect humans.  If clinically indicated additional testing with an alternate test  methodology [phone]) is advised. The SARS-CoV-2 RNA is generally  detectable in upper and lower respiratory sp ecimens during the acute  phase of infection. The expected result is Negative. Fact Sheet for Patients:  StrictlyIdeas.no Fact Sheet for Healthcare Providers: BankingDealers.co.za This test is not yet approved or cleared by the [place] FDA and has been authorized for detection and/or diagnosis of SARS-CoV-2 by FDA under an Emergency Use Authorization (EUA).  This EUA will remain in effect (meaning this  test can be used) for the duration of the COVID-19 declaration under Section 564(b)(1) of the Act, 21 U.S.C. section 360bbb-3(b)(1), unless the authorization is terminated or revoked sooner. Performed at [facility], [address] [place], [place] [address]   MRSA PCR Screening     Status: None   Collection Time: 08/31/19 11:08 PM   Specimen: Nasal Mucosa; Nasopharyngeal  Result Value Ref Range Status   MRSA by PCR NEGATIVE NEGATIVE Final    Comment:        The GeneXpert MRSA Assay (FDA approved for NASAL specimens only), is one component of a comprehensive MRSA colonization surveillance program. It is not intended to diagnose MRSA infection nor to guide or monitor treatment for MRSA infections. Performed at [facility], [place] [address], [place], [place] [address]   Surgical pcr screen     Status: None   Collection Time: 09/02/19  1:36 AM   Specimen: Nasal Mucosa; Nasal Swab  Result Value Ref Range Status   MRSA, PCR NEGATIVE NEGATIVE Final   Staphylococcus aureus NEGATIVE NEGATIVE Final    Comment: (NOTE) The Xpert SA Assay (FDA approved for NASAL specimens in patients 55 years of age and older), is one component of a comprehensive surveillance program. It is not intended to diagnose infection nor to guide or monitor treatment. Performed at [facility], [place] [address] [place],  [address]      Labs: BNP (last 3 results) No results for input(s): BNP in the last 8760 hours. Basic Metabolic Panel: Recent Labs  Lab 09/02/19 0210 09/03/19 0517 09/04/19 0159 09/05/19 0202 09/06/19 0356  NA 135 137 132* 141 140  K 3.2* 3.3* 3.7 3.6 3.8  CL 105 105 102 107 105  CO2 '23 23 23 23 23  ' GLUCOSE 117* 97 95 100* 101*  BUN '17 19 13 12 15  ' CREATININE 1.00 1.05 1.05 1.03 1.01  CALCIUM 7.6* 7.8* 7.9* 8.8* 8.9  MG  --  2.0  --   --   --    Liver Function Tests: Recent Labs  Lab 09/02/19 0210 09/03/19 0517 09/04/19 0159  09/05/19 0202 09/06/19 0356  AST 110* 65* 60* 63* 67*  ALT 292* 215* 180* 153* 142*  ALKPHOS 139* 179* 194* 171* 174*  BILITOT 4.1* 4.9* 3.8* 2.7* 1.6*  PROT 5.5* 6.0* 6.0* 5.9* 6.3*  ALBUMIN 2.8* 2.8* 2.8* 2.9* 2.9*   Recent Labs  Lab 08/31/19 1523 09/04/19 0159  LIPASE 23 28   No results for input(s): AMMONIA in the last 168 hours. CBC: Recent Labs  Lab 08/31/19 1523 09/01/19 1102 09/03/19 0517 09/04/19 0159 09/05/19 0202 09/06/19 0356  WBC 14.0* 14.2* 9.1 9.1 8.9 11.6*  NEUTROABS 11.8*  --   --   --   --   --  HGB 16.1 13.7 12.1* 11.6* 11.3* 11.4*  HCT 48.2 42.8 35.7* 35.4* 34.8* 35.5*  MCV 93.2 94.7 93.2 94.4 93.8 94.9  PLT 175 132* 94* 129* 149* 172   Cardiac Enzymes: No results for input(s): CKTOTAL, CKMB, CKMBINDEX, TROPONINI in the last 168 hours. BNP: Invalid input(s): POCBNP CBG: Recent Labs  Lab 09/05/19 2037 09/06/19 0045 09/06/19 0424 09/06/19 0733 09/06/19 1209  GLUCAP 136* 121* 108* 94 105*   D-Dimer No results for input(s): DDIMER in the last 72 hours. Hgb A1c No results for input(s): HGBA1C in the last 72 hours. Lipid Profile No results for input(s): CHOL, HDL, LDLCALC, TRIG, CHOLHDL, LDLDIRECT in the last 72 hours. Thyroid function studies Recent Labs    09/03/19 1346  TSH 3.066   Anemia work up No results for input(s): VITAMINB12, FOLATE, FERRITIN, TIBC, IRON, RETICCTPCT in the last 72 hours. Urinalysis    Component Value Date/Time   COLORURINE AMBER (A) 08/31/2019 1842   APPEARANCEUR CLEAR 08/31/2019 1842   LABSPEC >1.046 (H) 08/31/2019 1842   PHURINE 5.0 08/31/2019 1842   GLUCOSEU NEGATIVE 08/31/2019 1842   HGBUR SMALL (A) 08/31/2019 1842   BILIRUBINUR MODERATE (A) 08/31/2019 1842   KETONESUR NEGATIVE 08/31/2019 1842   PROTEINUR 30 (A) 08/31/2019 1842   UROBILINOGEN 0.2 12/09/2012 1145   NITRITE NEGATIVE 08/31/2019 1842   LEUKOCYTESUR NEGATIVE 08/31/2019 1842   Sepsis Labs Invalid input(s): PROCALCITONIN,  WBC,   LACTICIDVEN Microbiology Recent Results (from the past 240 hour(s))  Blood Culture (routine x 2)     Status: None   Collection Time: 08/31/19  4:03 PM   Specimen: BLOOD RIGHT ARM  Result Value Ref Range Status   Specimen Description   Final    BLOOD RIGHT ARM BOTTLES DRAWN AEROBIC AND ANAEROBIC   Special Requests Blood Culture adequate volume  Final   Culture   Final    NO GROWTH 5 DAYS Performed at [facility], [address] [place], [place] [address]    Report Status 09/05/2019 FINAL  Final  Blood Culture (routine x 2)     Status: None   Collection Time: 08/31/19  4:13 PM   Specimen: BLOOD RIGHT HAND  Result Value Ref Range Status   Specimen Description   Final    BLOOD RIGHT HAND BOTTLES DRAWN AEROBIC AND ANAEROBIC   Special Requests Blood Culture adequate volume  Final   Culture   Final    NO GROWTH 5 DAYS Performed at [facility], [address] [place], [place] [address]    Report Status 09/05/2019 FINAL  Final  Urine culture     Status: Abnormal   Collection Time: 08/31/19  6:42 PM   Specimen: In/Out Cath Urine  Result Value Ref Range Status   Specimen Description   Final    IN/OUT CATH URINE Performed at [facility], [address] [place], [place] [address]    Special Requests   Final    NONE Performed at [facility], [address] [place], [place] [address]    Culture MULTIPLE SPECIES PRESENT, [place] (A)  Final   Report Status 09/02/2019 FINAL  Final  SARS Coronavirus 2 [facility] order, Performed in [facility] hospital lab) Nasopharyngeal Urine, Clean Catch     Status: None   Collection Time: 08/31/19  6:42 PM   Specimen: Urine, Clean Catch; Nasopharyngeal  Result Value Ref Range Status   SARS Coronavirus 2 NEGATIVE NEGATIVE Final    Comment: (NOTE) If result is NEGATIVE SARS-CoV-2 target nucleic acids are  NOT DETECTED. The SARS-CoV-2 RNA is generally detectable in upper and lower  respiratory specimens during the acute phase of  infection. The lowest  concentration of SARS-CoV-2 viral copies this assay can detect is 250  copies / mL. A negative result does not preclude SARS-CoV-2 infection  and should not be used as the sole basis for treatment or other  patient management decisions.  A negative result may occur with  improper specimen collection / handling, submission of specimen other  than nasopharyngeal swab, presence of viral mutation(s) within the  areas targeted by this assay, and inadequate number of viral copies  (<250 copies / mL). A negative result must be combined with clinical  observations, patient history, and epidemiological information. If result is POSITIVE SARS-CoV-2 target nucleic acids are DETECTED. The SARS-CoV-2 RNA is generally detectable in upper and lower  respiratory specimens dur ing the acute phase of infection.  Positive  results are indicative of active infection with SARS-CoV-2.  Clinical  correlation with patient history and other diagnostic information is  necessary to determine patient infection status.  Positive results do  not rule out bacterial infection or co-infection with other viruses. If result is PRESUMPTIVE POSTIVE SARS-CoV-2 nucleic acids MAY BE PRESENT.   A presumptive positive result was obtained on the submitted specimen  and confirmed on repeat testing.  While 2019 novel coronavirus  (SARS-CoV-2) nucleic acids may be present in the submitted sample  additional confirmatory testing may be necessary for epidemiological  and / or clinical management purposes  to differentiate between  SARS-CoV-2 and other Sarbecovirus currently known to infect humans.  If clinically indicated additional testing with an alternate test  methodology [phone]) is advised. The SARS-CoV-2 RNA is generally  detectable in upper and lower respiratory sp ecimens during the acute  phase of infection. The expected result is Negative. Fact Sheet for Patients:   StrictlyIdeas.no Fact Sheet for Healthcare Providers: BankingDealers.co.za This test is not yet approved or cleared by the [place] FDA and has been authorized for detection and/or diagnosis of SARS-CoV-2 by FDA under an Emergency Use Authorization (EUA).  This EUA will remain in effect (meaning this test can be used) for the duration of the COVID-19 declaration under Section 564(b)(1) of the Act, 21 U.S.C. section 360bbb-3(b)(1), unless the authorization is terminated or revoked sooner. Performed at [facility], [address] [place], [place] [address]   MRSA PCR Screening     Status: None   Collection Time: 08/31/19 11:08 PM   Specimen: Nasal Mucosa; Nasopharyngeal  Result Value Ref Range Status   MRSA by PCR NEGATIVE NEGATIVE Final    Comment:        The GeneXpert MRSA Assay (FDA approved for NASAL specimens only), is one component of a comprehensive MRSA colonization surveillance program. It is not intended to diagnose MRSA infection nor to guide or monitor treatment for MRSA infections. Performed at [facility], [place] [address], [place], [place] [address]   Surgical pcr screen     Status: None   Collection Time: 09/02/19  1:36 AM   Specimen: Nasal Mucosa; Nasal Swab  Result Value Ref Range Status   MRSA, PCR NEGATIVE NEGATIVE Final   Staphylococcus aureus NEGATIVE NEGATIVE Final    Comment: (NOTE) The Xpert SA Assay (FDA approved for NASAL specimens in patients 64 years of age and older), is one component of a comprehensive surveillance program. It is not intended to diagnose infection nor to guide or monitor treatment. Performed at [facility]  [place] [address] [place], [place] [address]      Time coordinating discharge:  37 minutes  SIGNED:   Georgette Shell, MD  [facility] 09/06/2019, 12:28 PM Pager   If 7PM-7AM, please contact  night-coverage [email or web address] Password [email or web address]

## 2019-09-09 ENCOUNTER — Other Ambulatory Visit: Payer: Self-pay

## 2019-09-09 ENCOUNTER — Telehealth: Payer: Self-pay

## 2019-09-09 NOTE — Telephone Encounter (Signed)
Location of hospitalization: [place] Reason for hospitalization: Sepsis Date of discharge: 09/06/2019 Date of first communication with patient: today 09/09/2019 Person contacting patient: Cheri Kearns RMA Current symptoms: None just concerned about some meds Do you understand why you were in the Hospital: Yes Questions regarding discharge instructions: None Where were you discharged to: Home Medications reviewed: Yes Allergies reviewed: Yes Dietary changes reviewed: Yes. Discussed low fat and low salt diet.  Referals reviewed: NA Activities of Daily Living: Able to with mild limitations Any transportation issues/concerns: None Any patient concerns: None Confirmed importance & date/time of Follow up appt: Yes Confirmed with patient if condition begins to worsen call. Pt was given the office number and encouraged to call back with questions or concerns: Yes

## 2019-09-09 NOTE — Patient Outreach (Signed)
[name]:  Reviewed [name] alert;  Placed call to patient who reports that he is doing well.  Reviewed with patient to call primary MD for any problems or questions.  Reviewed with patient that he knows how to reach MD. Patient reports that he has call MD today and is awaiting a call back.  Patient denies any other concerns.   PLAN: Close case as questions have been answered.    [name], RN, BSN, CEN [facility] [employer] [phone]

## 2019-09-22 DIAGNOSIS — Z23 Encounter for immunization: Secondary | ICD-10-CM | POA: Diagnosis not present

## 2019-09-22 DIAGNOSIS — K8000 Calculus of gallbladder with acute cholecystitis without obstruction: Secondary | ICD-10-CM | POA: Diagnosis not present

## 2019-09-22 DIAGNOSIS — K8031 Calculus of bile duct with cholangitis, unspecified, with obstruction: Secondary | ICD-10-CM | POA: Diagnosis not present

## 2019-09-22 DIAGNOSIS — I2510 Atherosclerotic heart disease of native coronary artery without angina pectoris: Secondary | ICD-10-CM | POA: Diagnosis not present

## 2019-09-22 DIAGNOSIS — J449 Chronic obstructive pulmonary disease, unspecified: Secondary | ICD-10-CM | POA: Diagnosis not present

## 2019-10-03 ENCOUNTER — Ambulatory Visit: Payer: Medicare Other | Admitting: Gastroenterology

## 2019-10-07 ENCOUNTER — Ambulatory Visit: Payer: Medicare Other | Admitting: Internal Medicine

## 2019-10-09 ENCOUNTER — Encounter: Payer: Self-pay | Admitting: Gastroenterology

## 2019-10-09 ENCOUNTER — Encounter: Payer: Self-pay | Admitting: Cardiology

## 2019-10-09 ENCOUNTER — Other Ambulatory Visit: Payer: Self-pay

## 2019-10-09 ENCOUNTER — Ambulatory Visit: Payer: Medicare Other | Admitting: Gastroenterology

## 2019-10-09 ENCOUNTER — Other Ambulatory Visit (INDEPENDENT_AMBULATORY_CARE_PROVIDER_SITE_OTHER): Payer: Medicare Other

## 2019-10-09 ENCOUNTER — Ambulatory Visit: Payer: Medicare Other | Admitting: Cardiology

## 2019-10-09 VITALS — BP 124/67 | HR 84 | Ht 76.0 in | Wt 243.0 lb

## 2019-10-09 VITALS — BP 132/80 | HR 79 | Temp 97.6°F | Ht 76.0 in | Wt 243.6 lb

## 2019-10-09 DIAGNOSIS — Z9889 Other specified postprocedural states: Secondary | ICD-10-CM

## 2019-10-09 DIAGNOSIS — R634 Abnormal weight loss: Secondary | ICD-10-CM | POA: Diagnosis not present

## 2019-10-09 DIAGNOSIS — K8070 Calculus of gallbladder and bile duct without cholecystitis without obstruction: Secondary | ICD-10-CM

## 2019-10-09 DIAGNOSIS — K8050 Calculus of bile duct without cholangitis or cholecystitis without obstruction: Secondary | ICD-10-CM

## 2019-10-09 DIAGNOSIS — I25118 Atherosclerotic heart disease of native coronary artery with other forms of angina pectoris: Secondary | ICD-10-CM | POA: Diagnosis not present

## 2019-10-09 DIAGNOSIS — R1084 Generalized abdominal pain: Secondary | ICD-10-CM

## 2019-10-09 DIAGNOSIS — R109 Unspecified abdominal pain: Secondary | ICD-10-CM | POA: Diagnosis not present

## 2019-10-09 DIAGNOSIS — I4821 Permanent atrial fibrillation: Secondary | ICD-10-CM

## 2019-10-09 DIAGNOSIS — Z01810 Encounter for preprocedural cardiovascular examination: Secondary | ICD-10-CM

## 2019-10-09 DIAGNOSIS — K439 Ventral hernia without obstruction or gangrene: Secondary | ICD-10-CM | POA: Diagnosis not present

## 2019-10-09 DIAGNOSIS — R140 Abdominal distension (gaseous): Secondary | ICD-10-CM

## 2019-10-09 DIAGNOSIS — R0600 Dyspnea, unspecified: Secondary | ICD-10-CM | POA: Diagnosis not present

## 2019-10-09 DIAGNOSIS — R0609 Other forms of dyspnea: Secondary | ICD-10-CM

## 2019-10-09 DIAGNOSIS — I739 Peripheral vascular disease, unspecified: Secondary | ICD-10-CM | POA: Diagnosis not present

## 2019-10-09 DIAGNOSIS — F17200 Nicotine dependence, unspecified, uncomplicated: Secondary | ICD-10-CM

## 2019-10-09 LAB — CBC
HCT: 46.4 % (ref 39.0–52.0)
Hemoglobin: 15.3 g/dL (ref 13.0–17.0)
MCHC: 32.9 g/dL (ref 30.0–36.0)
MCV: 94.1 fl (ref 78.0–100.0)
Platelets: 226 10*3/uL (ref 150.0–400.0)
RBC: 4.93 Mil/uL (ref 4.22–5.81)
RDW: 15.3 % (ref 11.5–15.5)
WBC: 12.1 10*3/uL — ABNORMAL HIGH (ref 4.0–10.5)

## 2019-10-09 LAB — COMPREHENSIVE METABOLIC PANEL
ALT: 34 U/L (ref 0–53)
AST: 22 U/L (ref 0–37)
Albumin: 4.3 g/dL (ref 3.5–5.2)
Alkaline Phosphatase: 81 U/L (ref 39–117)
BUN: 21 mg/dL (ref 6–23)
CO2: 26 mEq/L (ref 19–32)
Calcium: 10.1 mg/dL (ref 8.4–10.5)
Chloride: 104 mEq/L (ref 96–112)
Creatinine, Ser: 1.16 mg/dL (ref 0.40–1.50)
GFR: 61.84 mL/min (ref >60.00)
Glucose, Bld: 97 mg/dL (ref 70–99)
Potassium: 4.3 mEq/L (ref 3.5–5.1)
Sodium: 138 mEq/L (ref 135–145)
Total Bilirubin: 0.6 mg/dL (ref 0.2–1.2)
Total Protein: 7.3 g/dL (ref 6.0–8.3)

## 2019-10-09 LAB — PROTIME-INR
INR: 1.1 ratio — ABNORMAL HIGH (ref 0.8–1.0)
Prothrombin Time: 13.3 s — ABNORMAL HIGH (ref 9.6–13.1)

## 2019-10-09 LAB — TSH: TSH: 2.09 u[IU]/mL (ref 0.35–4.50)

## 2019-10-09 LAB — CORTISOL: Cortisol, Plasma: 9.2 ug/dL

## 2019-10-09 LAB — LIPASE: Lipase: 37 U/L (ref 11.0–59.0)

## 2019-10-09 MED ORDER — ROSUVASTATIN CALCIUM 10 MG PO TABS: 10.0000 mg | ORAL_TABLET | Freq: Every day | ORAL | 2 refills | Status: DC

## 2019-10-09 NOTE — Patient Instructions (Addendum)
Your provider has requested that you go to the basement level for lab work before leaving today. Press "B" on the elevator. The lab is located at the first door on the left as you exit the elevator.  If you are age 72 or older, your body mass index should be between 23-30. Your Body mass index is 29.65 kg/m. If this is out of the aforementioned range listed, please consider follow up with your Primary Care Provider.  Referral sent to [place]  Thank you for choosing me and [facility].  Dr. Rush Landmark

## 2019-10-09 NOTE — Progress Notes (Signed)
Primary Physician/Referring:  Sinda Du, MD  Patient ID: [name], male    DOB: January 21, 1946, 73 y.o.   MRN: [id number]  Chief Complaint  Patient presents with  . Atrial Fibrillation  . New Patient (Initial Visit)   HPI:    [name]  is a 73 y.o. Caucasian male with GERD, abdominal discomfort and choledocholithiasis, history of non-Hodgkin's lymphoma, diabetes mellitus, paroxysmal atrial fibrillation, tobacco use disorder with COPD, obstructive sleep apnea on CPAP, last evaluated in the emergency room on 08/31/2019 with abdominal discomfort.  I had seen him as a consult on 09/03/2019.  He also underwent nuclear stress testing while in the hospital on 09/04/2019 which was intermediate risk with reversible inferolateral ischemia.  I recommended that we proceed with surgery as his main issue has been recurrent abdominal discomfort.    Patient was seen by gastroenterologist Irving Copas, MD today for choledocholithiasis and abdominal discomfort and weight loss and referral has been made for surgical consultation. He continues to have severe abdominal discomfort and has lost additional 10-12 pounds since hospital discharge, his main complaint today is continued abdominal discomfort.     Past Medical History:  Diagnosis Date  . Atrial fibrillation ([place])   . Avascular necrosis of bones of both hips ([place])   . 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 Cancer ([place])   . Colonic polyp   . Complication of anesthesia    hard to wake up, "felt like died and revived me"  . COPD (chronic obstructive pulmonary disease) (HCC)    mild PFT abnormalities; normal ABG  . Degenerative disc disease, lumbar    HNP of the LS spine  . Diabetes mellitus    type 2  . GERD (gastroesophageal reflux disease)   . Glaucoma   . H/O hiatal hernia   . Hx MRSA infection 2010   sith splenic abcess  . Non Hodgkin's lymphoma ([place]) 06/2009   chemotherapy until 11/2010  . Obesity   . Obstructive sleep apnea     treated with CPAP  . Orthostatic hypotension    lightheadness; no definate loss of consciousness  . Pedal edema   . PONV (postoperative nausea and vomiting)   . Splenic abscess 07/2009  . Tobacco abuse    45 pack years   Past Surgical History:  Procedure Laterality Date  . ABSCESS DRAINAGE  2010   Splenic  . ANAL FISSURE 44  73years old  . BILIARY DILATION  09/02/2019   Procedure: BILIARY DILATION;  Surgeon: Rush Landmark Telford Nab., MD;  Location: [facility];  Service: Gastroenterology;;  . BILIARY STENT PLACEMENT N/A 09/02/2019   Procedure: BILIARY STENT PLACEMENT;  Surgeon: Irving Copas., MD;  Location: [facility];  Service: Gastroenterology;  Laterality: N/A;  . BIOPSY  09/02/2019   Procedure: BIOPSY;  Surgeon: Rush Landmark Telford Nab., MD;  Location: [facility];  Service: Gastroenterology;;  . CATARACT EXTRACTION     Right with lens implant  . COLONOSCOPY W/ POLYPECTOMY  2008   with snare polypectomy  . ERCP N/A 09/02/2019   Procedure: ENDOSCOPIC RETROGRADE CHOLANGIOPANCREATOGRAPHY (ERCP);  Surgeon: Irving Copas., MD;  Location: [facility];  Service: Gastroenterology;  Laterality: N/A;  . EYE SURGERY     lens implant  . INGUINAL HERNIA REPAIR Left 03/09/2017   Procedure: OPEN REPAIR LEFT INGUINAL HERNIA;  Surgeon: Arta Bruce Kinsinger, MD;  Location: [facility];  Service: General;  Laterality: Left;  . INSERTION OF MESH Left 03/09/2017   Procedure:  INSERTION OF MESH;  Surgeon: Arta Bruce Kinsinger, MD;  Location: [facility];  Service: General;  Laterality: Left;  . KNEE ARTHROSCOPY     right  . LYMPH NODE BIOPSY  2010   done x 2 for NHL diagnosis  . REMOVAL OF STONES  09/02/2019   Procedure: REMOVAL OF STONES;  Surgeon: Rush Landmark Telford Nab., MD;  Location: [facility];  Service: Gastroenterology;;  . [name]  09/02/2019   Procedure: [name];  Surgeon: Irving Copas., MD;  Location: [facility];  Service: Gastroenterology;;  .  TONSILLECTOMY    . TOTAL HIP ARTHROPLASTY  12/13/2012   Procedure: TOTAL HIP ARTHROPLASTY ANTERIOR APPROACH;  Surgeon: Mcarthur Rossetti, MD;  Location: [facility];  Service: Orthopedics;  Laterality: Left;  Left Total Hip Arthroplasty, Anterior Approach (C-Arm)   Social History   Socioeconomic History  . Marital status: Divorced    Spouse name: Not on file  . Number of children: Not on file  . Years of education: Not on file  . Highest education level: Not on file  Occupational History  . Not on file  Social Needs  . Financial resource strain: Not on file  . Food insecurity    Worry: Not on file    Inability: Not on file  . Transportation needs    Medical: Not on file    Non-medical: Not on file  Tobacco Use  . Smoking status: Current Every Day Smoker    Packs/day: 1.00    Years: 55.00    Pack years: 55.00    Types: Cigarettes  . Smokeless tobacco: Never Used  Substance and Sexual Activity  . Alcohol use: No  . Drug use: No  . Sexual activity: Not on file  Lifestyle  . Physical activity    Days per week: Not on file    Minutes per session: Not on file  . Stress: Not on file  Relationships  . Social Herbalist on phone: Not on file    Gets together: Not on file    Attends religious service: Not on file    Active member of club or organization: Not on file    Attends meetings of clubs or organizations: Not on file    Relationship status: Not on file  . Intimate partner violence    Fear of current or ex partner: Not on file    Emotionally abused: Not on file    Physically abused: Not on file    Forced sexual activity: Not on file  Other Topics Concern  . Not on file  Social History Narrative  . Not on file   ROS  Review of Systems  Constitution: Negative for chills, decreased appetite, malaise/fatigue and weight gain.  Cardiovascular: Positive for claudication (right calf worse than left.) and dyspnea on exertion (chronic). Negative for leg  swelling, near-syncope and syncope.  Respiratory: Positive for cough (chronic).   Endocrine: Negative for cold intolerance.  Hematologic/Lymphatic: Does not bruise/bleed easily.  Musculoskeletal: Positive for joint pain (left hip worse than right, h/o arthroplasty). Negative for joint swelling.  Gastrointestinal: Positive for abdominal pain and nausea. Negative for anorexia, change in bowel habit, hematochezia and melena.  Neurological: Negative for headaches and light-headedness.  Psychiatric/Behavioral: Negative for depression and substance abuse.  All other systems reviewed and are negative.  Objective   Vitals with BMI 10/09/2019 10/09/2019 09/06/2019  Height '6\' 4"'  '6\' 4"'  -  Weight 243 lbs 243 lbs 10 oz -  BMI 29.59 29.66 -  Systolic 099 833 -  Diastolic 67 80 -  Pulse 84 79 80    Blood pressure 124/67, pulse 84, height '6\' 4"'  (1.93 m), weight 243 lb (110.2 kg), SpO2 96 %. Body mass index is 29.58 kg/m.   Physical Exam  Constitutional: He appears well-developed and well-nourished. No distress.  HENT:  Head: Atraumatic.  Eyes: Conjunctivae are normal.  Neck: Neck supple. No JVD present. No thyromegaly present.  Cardiovascular: Normal rate. An irregularly irregular rhythm present. Exam reveals no gallop.  No murmur heard. Pulses:      Carotid pulses are 2+ on the right side and 2+ on the left side.      Radial pulses are 2+ on the right side and 2+ on the left side.       Femoral pulses are 1+ on the right side and 1+ on the left side.      Popliteal pulses are 0 on the right side and 0 on the left side.       Dorsalis pedis pulses are 0 on the right side and 0 on the left side.       Posterior tibial pulses are 0 on the right side and 0 on the left side.  S1 variable, S2 normal. No leg edema, no JVD.  Pulmonary/Chest: Effort normal and breath sounds normal.  Abdominal: Soft. Bowel sounds are normal.  Musculoskeletal: Normal range of motion.  Neurological: He is alert.   Skin: Skin is warm and dry.  Psychiatric: He has a normal mood and affect.   Radiology: No results found.  Laboratory examination:   Recent Labs    09/04/19 0159 09/05/19 0202 09/06/19 0356 10/09/19 1157  NA 132* 141 140 138  K 3.7 3.6 3.8 4.3  CL 102 107 105 104  CO2 '23 23 23 26  ' GLUCOSE 95 100* 101* 97  BUN '13 12 15 21  ' CREATININE 1.05 1.03 1.01 1.16  CALCIUM 7.9* 8.8* 8.9 10.1  GFRNONAA >60 >60 >60  --   GFRAA >60 >60 >60  --    CMP Latest Ref Rng & Units 10/09/2019 09/06/2019 09/05/2019  Glucose 70 - 99 mg/dL 97 101(H) 100(H)  BUN 6 - 23 mg/dL '21 15 12  ' Creatinine 0.40 - 1.50 mg/dL 1.16 1.01 1.03  Sodium 135 - 145 mEq/L 138 140 141  Potassium 3.5 - 5.1 mEq/L 4.3 3.8 3.6  Chloride 96 - 112 mEq/L 104 105 107  CO2 19 - 32 mEq/L '26 23 23  ' Calcium 8.4 - 10.5 mg/dL 10.1 8.9 8.8(L)  Total Protein 6.0 - 8.3 g/dL 7.3 6.3(L) 5.9(L)  Total Bilirubin 0.2 - 1.2 mg/dL 0.6 1.6(H) 2.7(H)  Alkaline Phos 39 - 117 U/L 81 174(H) 171(H)  AST 0 - 37 U/L 22 67(H) 63(H)  ALT 0 - 53 U/L 34 142(H) 153(H)   CBC Latest Ref Rng & Units 10/09/2019 09/06/2019 09/05/2019  WBC 4.0 - 10.5 K/uL 12.1(H) 11.6(H) 8.9  Hemoglobin 13.0 - 17.0 g/dL 15.3 11.4(L) 11.3(L)  Hematocrit 39.0 - 52.0 % 46.4 35.5(L) 34.8(L)  Platelets 150.0 - 400.0 K/uL 226.0 172 149(L)   Lipid Panel     Component Value Date/Time   CHOL (H) 07/17/2009 1844    204        ATP III CLASSIFICATION:  <200     mg/dL   Desirable  200-239  mg/dL   Borderline High  >=240    mg/dL   High          TRIG 151 (H)  07/17/2009 1844   HDL 26 (L) 07/17/2009 1844   CHOLHDL 7.8 07/17/2009 1844   VLDL 30 07/17/2009 1844   LDLCALC (H) 07/17/2009 1844    148        Total Cholesterol/HDL:CHD Risk Coronary Heart Disease Risk Table                     Men   Women  1/2 Average Risk   3.4   3.3  Average Risk       5.0   4.4  2 X Average Risk   9.6   7.1  3 X Average Risk  23.4   11.0        Use the calculated Patient Ratio above and the  CHD Risk Table to determine the patient's CHD Risk.        ATP III CLASSIFICATION (LDL):  <100     mg/dL   Optimal  100-129  mg/dL   Near or Above                    Optimal  130-159  mg/dL   Borderline  160-189  mg/dL   High  >190     mg/dL   Very High   HEMOGLOBIN A1C Lab Results  Component Value Date   HGBA1C 5.7 (H) 03/08/2017   MPG 117 03/08/2017   TSH Recent Labs    09/03/19 1346 10/09/19 1157  TSH 3.066 2.09   Medications and allergies   Allergies  Allergen Reactions  . Pred Forte [Prednisolone Acetate] Other (See Comments)    Severe burning  . Atorvastatin Other (See Comments)    Shaky, dizziness, headaches  . Codeine Other (See Comments)    REACTION: Shakes      Prior to Admission medications   Medication Sig Start Date End Date Taking? Authorizing Provider  diltiazem (CARDIZEM CD) 120 MG 24 hr capsule Take 1 capsule (120 mg total) by mouth daily with breakfast. 09/07/19  Yes Georgette Shell, MD  ipratropium-albuterol (DUONEB) 0.5-2.5 (3) MG/3ML SOLN Take 3 mLs by nebulization every 6 (six) hours as needed (for wheezing/shortness of breath).   Yes [provider]  latanoprost (XALATAN) 0.005 % ophthalmic solution Place 1 drop into both eyes daily at 10 pm. 12/04/16  Yes [provider]  metFORMIN (GLUCOPHAGE) 500 MG tablet Take 500 mg by mouth 2 (two) times daily.   Yes [provider]  metoprolol tartrate (LOPRESSOR) 50 MG tablet Take 1 tablet (50 mg total) by mouth 2 (two) times daily. 09/06/19  Yes Georgette Shell, MD  senna (SENOKOT) 8.6 MG tablet Take 1-2 tablets by mouth daily as needed for constipation. Constipation   Yes [provider]  timolol (TIMOPTIC) 0.5 % ophthalmic solution Place 1 drop into both eyes daily. 01/12/17  Yes [provider]  rivaroxaban (XARELTO) 20 MG TABS tablet Take 1 tablet (20 mg total) by mouth daily with supper. Patient not taking: Reported on 10/09/2019 09/06/19   Georgette Shell, MD     Current Outpatient Medications  Medication Instructions  . diltiazem (CARDIZEM CD) 120 mg, Oral, Daily with breakfast  . ipratropium-albuterol (DUONEB) 0.5-2.5 (3) MG/3ML SOLN 3 mLs, Nebulization, Every 6 hours PRN  . latanoprost (XALATAN) 0.005 % ophthalmic solution 1 drop, Both Eyes, Daily at 10 pm  . metFORMIN (GLUCOPHAGE) 500 mg, 2 times daily  . metoprolol tartrate (LOPRESSOR) 50 mg, Oral, 2 times daily  . rivaroxaban (XARELTO) 20 mg, Oral, Daily with  supper  . rosuvastatin (CRESTOR) 10 mg, Oral, Daily  . senna (SENOKOT) 8.6 MG tablet 1-2 tablets, Oral, Daily PRN, Constipation  . timolol (TIMOPTIC) 0.5 % ophthalmic solution 1 drop, Both Eyes, Daily    Cardiac Studies:   Echocardiogram 09/01/2019 :  1. The left ventricle has low normal systolic function, with an ejection fraction of 55-60%. The cavity size was normal. There is moderately increased left ventricular wall thickness. Left ventricular diastolic function could not be evaluated secondary  to atrial fibrillation. Inadequat for regional wall motion assessment. 2. The right ventricle has normal systolic function. The cavity was normal. There is no increase in right ventricular wall thickness. 3. Moderate thickening of the mitral valve leaflet. Mild calcification of the mitral valve leaflet. There is moderate mitral annular calcification present. 4. The aortic valve is tricuspid. Mild sclerosis of the aortic valve. Aortic valve regurgitation was not assessed by color flow Doppler. 5. The aorta is normal unless otherwise noted. 6. The interatrial septum appears to be lipomatous. 7. Trivial pericardial effusion is present. The pericardial effusion is circumferential.  Lexiscan Cardiolite stress 09/04/2019: Moderate area of decreased myocardial activity in the mid and distal inferior, inferolateral and inferoapical wall suggestive of myocardial ischemia, normal LV wall motion, LVEF 50%.  Intermediate risk  study.  Assessment     ICD-10-CM   1. Permanent atrial fibrillation (HCC)  I48.21 EKG 12-Lead   CHA2DS2-VASc Score is 4.  Yearly risk of stroke: 4%  2. Coronary artery disease involving native coronary artery of native heart with other form of angina pectoris ([place])  I25.118   3. Dyspnea on exertion  R06.00   4. Preoperative cardiovascular examination  Z01.810   5. Peripheral artery disease (HCC)  I73.9 PCV LOWER ARTERIAL (BILATERAL)    rosuvastatin (CRESTOR) 10 MG tablet  6. Tobacco use disorder  F17.200     EKG 10/09/2019: Atrial fibrillation with controlled ventricular response at the rate of 87 bpm, normal axis, poor R-wave progression, probably normal variant.  Cannot exclude anteroseptal infarct old.  Normal QT interval.  Recommendations:   He continues to have severe abdominal discomfort and has lost additional 10-12 pounds since hospital discharge, his main complaint today is continued abdominal discomfort.  He does have chronic dyspnea is unchanged, no PND or orthopnea.  He has symptoms suggestive claudication and bilateral calf and also hip pain probably related to DJD but significant PAD cannot be excluded.  He probably has underlying CAD in view of abnormal stress test and his underlying cardiovascular risk factors including tobacco use disorder.  I'll start him on Lipitor, previously he had not tolerated Crestor due to severe myalgias.  This is for cardiovascular protection.    Although he has had abnormal nuclear stress test, I will send for surgical clearance, with moderate risk for cardiac morbidity and mortality.  I met his daughter and explained that in view of ongoing abdominal discomfort that it is best to proceed with surgery hopefully it'll be laparoscopic.  She understands the risks associated with this including peri-porcedural vascular events and 1-2% mortality risk.  He will hold Xarelto for 2 days prior to surgery. Could consider starting ASA 81 mg when holding  Xarelto  I'd like to see him back in 2 months, will obtain lower extremity arterial duplex prior to his office visit.  I will sent surgical risk stratification note to Kaylyn Lim, MD  Adrian Prows, MD, [facility] 10/10/2019, 7:18 AM [facility] [place] Pager: [phone] Office: [phone] If no answer Cell [phone]

## 2019-10-10 ENCOUNTER — Encounter: Payer: Self-pay | Admitting: Cardiology

## 2019-10-12 ENCOUNTER — Encounter: Payer: Self-pay | Admitting: Gastroenterology

## 2019-10-12 DIAGNOSIS — K439 Ventral hernia without obstruction or gangrene: Secondary | ICD-10-CM | POA: Insufficient documentation

## 2019-10-12 DIAGNOSIS — R634 Abnormal weight loss: Secondary | ICD-10-CM | POA: Insufficient documentation

## 2019-10-12 DIAGNOSIS — Z9889 Other specified postprocedural states: Secondary | ICD-10-CM | POA: Insufficient documentation

## 2019-10-12 DIAGNOSIS — R1084 Generalized abdominal pain: Secondary | ICD-10-CM | POA: Insufficient documentation

## 2019-10-12 DIAGNOSIS — R140 Abdominal distension (gaseous): Secondary | ICD-10-CM | POA: Insufficient documentation

## 2019-10-12 DIAGNOSIS — K8070 Calculus of gallbladder and bile duct without cholecystitis without obstruction: Secondary | ICD-10-CM | POA: Insufficient documentation

## 2019-10-12 DIAGNOSIS — K8050 Calculus of bile duct without cholangitis or cholecystitis without obstruction: Secondary | ICD-10-CM | POA: Insufficient documentation

## 2019-10-12 NOTE — Progress Notes (Signed)
[place] VISIT   Primary Care Provider Sinda Du, MD [address] [place] [address] [phone]  Patient Profile: [name] is a 73 y.o. male with a pmh significant for atrial fibrillation (on Xarelto), COPD, GERD, obesity, hypertension, hyperlipidemia, glaucoma, colon polyps, prior lymphoma, avascular necrosis of hip, choledocholithiasis (, status post ERCP with stenting due to cholangitis).  The patient presents to the [facility] for an evaluation and management of problem(s) noted below:  Problem List 1. Calculus of gallbladder and bile duct without cholecystitis or obstruction   2. Choledocholithiasis   3. History of biliary stent insertion   4. History of ERCP   5. Chronic Generalized abdominal pain   6. Abdominal bloating   7. Weight loss   8. Ventral hernia without obstruction or gangrene     History of Present Illness This is a patient that I met in September who initially presented to an outside hospital and was transferred in to [facility] due to concern for obstructive jaundice secondary to choledocholithiasis.  He underwent an ERCP with stone extraction as well as finding of cholangitis.  A biliary stent was placed as a result of desire to maintain adequate drainage.  He was evaluated by surgery but felt to be too sick during his hospitalization for cholecystectomy and was supposed to be planned for an outpatient follow-up.  He was discharged.  He continues to have abdominal discomfort although he has had chronic abdominal pain generalized throughout his abdomen for greater than 10 years.  The discomfort that brought him to the hospital has subsided but he is still concerned as to why he still has his gallbladder.  He wants his gallbladder out.  The patient can have discomfort occur throughout his abdomen with intake of food or with fasting.  He has had abdominal pain since his diagnosis of lymphoma with  subsequent need for drainage catheters in his abdomen secondary to likely abscesses.  He has bloating.  He has abdominal distention.  He has a normal bowel movement once daily after he eats breakfast or drinks coffee.  His abdominal pain does not necessarily improve when he has a bowel movement but he does not monitor the stool closely.  He has had previous colon polyps but more recently within the last year has had a Cologuard that was negative per report.  He denies any blood in his stools.  He has had at least a 10 pound weight loss over the course of when he initially got sick and was hospitalized and subsequently discharged but is okay with his weight loss as he is also trying to lose weight to become healthier.  GI Review of Systems Positive as above including infrequent pyrosis Negative for dysphagia, odynophagia, melena, hematochezia  Review of Systems General: Denies fevers/chills HEENT: Denies oral lesions Cardiovascular: Denies chest pain/palpitations Pulmonary: Denies shortness of breath/nocturnal cough Gastroenterological: See HPI Genitourinary: Denies darkened urine Hematological: Denies easy bruising/bleeding Endocrine: Denies temperature intolerance Dermatological: Denies jaundice Psychological: Mood is stable   Medications Current Outpatient Medications  Medication Sig Dispense Refill   diltiazem (CARDIZEM CD) 120 MG 24 hr capsule Take 1 capsule (120 mg total) by mouth daily with breakfast. 60 capsule 1   ipratropium-albuterol (DUONEB) 0.5-2.5 (3) MG/3ML SOLN Take 3 mLs by nebulization every 6 (six) hours as needed (for wheezing/shortness of breath).     latanoprost (XALATAN) 0.005 % ophthalmic solution Place 1 drop into both eyes daily at 10 pm.     metFORMIN (  GLUCOPHAGE) 500 MG tablet Take 500 mg by mouth 2 (two) times daily.     senna (SENOKOT) 8.6 MG tablet Take 1-2 tablets by mouth daily as needed for constipation. Constipation     timolol (TIMOPTIC) 0.5 %  ophthalmic solution Place 1 drop into both eyes daily.     metoprolol tartrate (LOPRESSOR) 50 MG tablet Take 1 tablet (50 mg total) by mouth 2 (two) times daily. 60 tablet 1   rivaroxaban (XARELTO) 20 MG TABS tablet Take 1 tablet (20 mg total) by mouth daily with supper. (Patient not taking: Reported on 10/09/2019) 30 tablet 1   rosuvastatin (CRESTOR) 10 MG tablet Take 1 tablet (10 mg total) by mouth daily. 30 tablet 2   No current facility-administered medications for this visit.    Facility-Administered Medications Ordered in Other Visits  Medication Dose Route Frequency Provider Last Rate Last Dose   heparin lock flush 100 unit/mL  500 Units Intravenous Once Sinda Du, MD       sodium chloride 0.9 % injection 10 mL  10 mL Intravenous PRN Sinda Du, MD        Allergies Allergies  Allergen Reactions   Pred Forte [Prednisolone Acetate] Other (See Comments)    Severe burning   Atorvastatin Other (See Comments)    Shaky, dizziness, headaches   Codeine Other (See Comments)    REACTION: Shakes     Histories Past Medical History:  Diagnosis Date   Atrial fibrillation ([place])    Avascular necrosis of bones of both hips ([place])    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   Cancer [facility]    Colonic polyp    Complication of anesthesia    hard to wake up, "felt like died and revived me"   COPD (chronic obstructive pulmonary disease) (H[facility])    mild PFT abnormalities; normal ABG   Degenerative disc disease, lumbar    HNP of the LS spine   Diabetes mellitus    type 2   GERD (gastroesophageal reflux disease)    Glaucoma    H/O hiatal hernia    Hx MRSA infection 2010   sith splenic abcess   Non Hodgkin's lymphoma ([place]) 06/2009   chemotherapy until 11/2010   Obesity    Obstructive sleep apnea    treated with CPAP   Orthostatic hypotension    lightheadness; no definate loss of consciousness   Pedal edema    PONV (postoperative nausea and vomiting)     Splenic abscess 07/2009   Tobacco abuse    45 pack years   Past Surgical History:  Procedure Laterality Date   ABSCESS DRAINAGE  2010   Splenic   ANAL FISSURE REPAIR  73years old   BILIARY DILATION  09/02/2019   Procedure: BILIARY DILATION;  Surgeon: Rush Landmark Telford Nab., MD;  Location: [facility];  Service: Gastroenterology;;   BILIARY STENT PLACEMENT N/A 09/02/2019   Procedure: BILIARY STENT PLACEMENT;  Surgeon: Irving Copas., MD;  Location: [facility];  Service: Gastroenterology;  Laterality: N/A;   BIOPSY  09/02/2019   Procedure: BIOPSY;  Surgeon: Irving Copas., MD;  Location: [facility];  Service: Gastroenterology;;   CATARACT EXTRACTION     Right with lens implant   COLONOSCOPY W/ POLYPECTOMY  2008   with snare polypectomy   ERCP N/A 09/02/2019   Procedure: ENDOSCOPIC RETROGRADE CHOLANGIOPANCREATOGRAPHY (ERCP);  Surgeon: Irving Copas., MD;  Location: [facility];  Service: Gastroenterology;  Laterality: N/A;   EYE SURGERY     lens  implant   INGUINAL HERNIA REPAIR Left 03/09/2017   Procedure: OPEN REPAIR LEFT INGUINAL HERNIA;  Surgeon: Arta Bruce Kinsinger, MD;  Location: [facility];  Service: General;  Laterality: Left;   INSERTION OF MESH Left 03/09/2017   Procedure: INSERTION OF MESH;  Surgeon: Arta Bruce Kinsinger, MD;  Location: [facility];  Service: General;  Laterality: Left;   KNEE ARTHROSCOPY     right   LYMPH NODE BIOPSY  2010   done x 2 for NHL diagnosis   REMOVAL OF STONES  09/02/2019   Procedure: REMOVAL OF STONES;  Surgeon: Irving Copas., MD;  Location: [facility];  Service: Gastroenterology;;   [name]  09/02/2019   Procedure: [name];  Surgeon: Irving Copas., MD;  Location: [facility];  Service: Gastroenterology;;   TONSILLECTOMY     TOTAL HIP ARTHROPLASTY  12/13/2012   Procedure: TOTAL HIP ARTHROPLASTY ANTERIOR APPROACH;  Surgeon: Mcarthur Rossetti, MD;  Location: [facility];  Service:  Orthopedics;  Laterality: Left;  Left Total Hip Arthroplasty, Anterior Approach (C-Arm)   Social History   Socioeconomic History   Marital status: Divorced    Spouse name: Not on file   Number of children: Not on file   Years of education: Not on file   Highest education level: Not on file  Occupational History   Not on file  Social Needs   Financial resource strain: Not on file   Food insecurity    Worry: Not on file    Inability: Not on file   Transportation needs    Medical: Not on file    Non-medical: Not on file  Tobacco Use   Smoking status: Current Every Day Smoker    Packs/day: 1.00    Years: 55.00    Pack years: 55.00    Types: Cigarettes   Smokeless tobacco: Never Used  Substance and Sexual Activity   Alcohol use: No   Drug use: No   Sexual activity: Not on file  Lifestyle   Physical activity    Days per week: Not on file    Minutes per session: Not on file   Stress: Not on file  Relationships   Social connections    Talks on phone: Not on file    Gets together: Not on file    Attends religious service: Not on file    Active member of club or organization: Not on file    Attends meetings of clubs or organizations: Not on file    Relationship status: Not on file   Intimate partner violence    Fear of current or ex partner: Not on file    Emotionally abused: Not on file    Physically abused: Not on file    Forced sexual activity: Not on file  Other Topics Concern   Not on file  Social History Narrative   Not on file   Family History  Problem Relation Age of Onset   Heart failure Mother 50       results of chf   Leukemia Father    Colon cancer Sister    Heart disease Brother    Heart disease Brother    Heart disease Brother    Heart disease Brother    Esophageal cancer Neg Hx    Inflammatory bowel disease Neg Hx    Liver disease Neg Hx    Pancreatic cancer Neg Hx    Stomach cancer Neg Hx    I have reviewed his  medical, social, and family history  in detail and updated the electronic medical record as necessary.    PHYSICAL EXAMINATION  BP 132/80    Pulse 79    Temp 97.6 F (36.4 C)    Ht _0  (1.93 m)    Wt 243 lb 9.6 oz (110.5 kg)    BMI 29.65 kg/m  Wt Readings from Last 3 Encounters:  10/09/19 243 lb (110.2 kg)  10/09/19 243 lb 9.6 oz (110.5 kg)  09/06/19 255 lb 4.8 oz (115.8 kg)  GEN: NAD, appears stated age, doesn't appear chronically ill, accompanied by family member PSYCH: Cooperative, without pressured speech EYE: Conjunctivae pink, sclerae anicteric ENT: MMM, without oral ulcers CV: RR without R/Gs  RESP: Wheezing apparent GI: NABS, soft, protuberant abdomen, rounded, obese, tenderness generalized throughout the abdomen even upon light touch, ventral diastases noted  MSK/EXT: Trace bilateral lower extremity edema SKIN: No jaundice NEURO:  Alert & Oriented x 3, no focal deficits   REVIEW OF DATA  I reviewed the following data at the time of this encounter:  GI Procedures and Studies  September 2020 ERCP - No gross lesions in the stomach. Biopsies obtained for HP. - Erythematous duodenopathy. - The major papilla appeared to be prominent. Biopsied. - Filling defects consistent with stones and sludge were seen on the cholangiogram in the distal duct (normal sized however). - The upper third of the main bile duct, middle third of the main bile duct and left and right hepatic ducts and all intrahepatic branches were severely dilated, with a stone causing an obstruction. - Choledocholithiasis and cholangitis and biliary sludge was found. Complete removal was accomplished by biliary sphincterotomy/biliary sphincteroplasty and balloon sweeping. - Drainage was very slow from the common hepatic duct region, with amount of cholangitis still draining, decision made to place a temporary stent to ensure adequate drainage. - Unable to visualize the cystic duct filling even upon occlusion  cholangiogram.  Laboratory Studies  Reviewed those in epic  Imaging Studies  September 2020 CT abdomen pelvis with contrast IMPRESSION: 1. Interval mild intrahepatic biliary ductal dilatation and marked dilatation of the proximal common duct with probable small noncalcified stones in the distal common duct. 2. Probable cholelithiasis. 3. 4 mm nonobstructing lower pole right renal calculus. 4. Extensive colonic diverticulosis. 5. Interval repair of the previously demonstrated left inguinal hernia. 6. Small right inguinal hernia containing fat. 7. Right femoral head avascular necrosis and moderate right hip degenerative changes.   ASSESSMENT  Mr. Encinas is a 73 y.o. male with a pmh significant for atrial fibrillation (on Xarelto), COPD, GERD, obesity, hypertension, hyperlipidemia, glaucoma, colon polyps, prior lymphoma, avascular necrosis of hip, choledocholithiasis (, status post ERCP with stenting due to cholangitis).  The patient is seen today for evaluation and management of:  1. Calculus of gallbladder and bile duct without cholecystitis or obstruction   2. Choledocholithiasis   3. History of biliary stent insertion   4. History of ERCP   5. Chronic Generalized abdominal pain   6. Abdominal bloating   7. Weight loss   8. Ventral hernia without obstruction or gangrene    The patient is hemodynamically stable.  The patient has manifestation of chronic abdominal pain and bloating and distention which has been ongoing for greater than 10 years really since his prior diagnosis of non-Hodgkin's lymphoma and treatment.  He believes many symptoms related to his prior drainage as a result of abscess in his abdomen.  No current imaging suggests need for further drainage.  However, most importantly, the patient needs  to have a cholecystectomy.  He had evidence of cholangitis and choledocholithiasis status post ERCP with sphincterotomy and stone extraction and stenting only because he had  significant cholangitis at the time.  I will to keep his biliary stent in place for now but we are going to refer him back to [place] surgery to evaluate for timing of interval cholecystectomy.  His other GI symptoms seem to potentially be neuropathic in nature.  He may benefit from a TCA or neuromodulator in the future but I would rather the patient undergoes cholecystectomy first.  I will remove his biliary stent and then we can see how things go from there.  I asked the patient to initiate fiber on a daily basis to try and help keep things moving.  Large ventral hernia we will not likely need treatment but will defer that to our surgical colleagues to evaluate him for timing of cholecystectomy.  All patient questions were answered, to the best of my ability, and the patient agrees to the aforementioned plan of action with follow-up as indicated.   PLAN  Laboratories as outlined below to further evaluate his chronic abdominal discomfort Holding on TCA or neuromodulation for now but will consider in future Fiber daily Referral to [facility] surgery for cholecystectomy ERCP to be scheduled after cholecystectomy for biliary stent removal   Orders Placed This Encounter  Procedures   CBC   Comp Met (CMET)   Lipase   TSH   Cortisol   INR/PT   Ambulatory referral to General Surgery    New Prescriptions   ROSUVASTATIN (CRESTOR) 10 MG TABLET    Take 1 tablet (10 mg total) by mouth daily.   Modified Medications   No medications on file    Planned Follow Up No follow-ups on file.   Justice Britain, MD [facility] Office # [phone]

## 2019-10-14 ENCOUNTER — Telehealth: Payer: Self-pay

## 2019-10-14 NOTE — Telephone Encounter (Signed)
Yes he should, please refill

## 2019-10-14 NOTE — Telephone Encounter (Signed)
Pt called and wants to know if he should cont Metoprolol he was given in the hospital

## 2019-10-15 ENCOUNTER — Other Ambulatory Visit: Payer: Self-pay

## 2019-10-15 DIAGNOSIS — I25118 Atherosclerotic heart disease of native coronary artery with other forms of angina pectoris: Secondary | ICD-10-CM

## 2019-10-15 MED ORDER — METOPROLOL TARTRATE 50 MG PO TABS: 50.0000 mg | ORAL_TABLET | Freq: Two times a day (BID) | ORAL | 1 refills | Status: DC

## 2019-10-15 NOTE — Telephone Encounter (Signed)
Done

## 2019-10-23 LAB — COMPREHENSIVE METABOLIC PANEL
BASO(ABSOLUTE): 0.1
Basophils: 0
Eosinophils Absolute: 0
Eosinophils, %: 2
HCT: 46 — AB (ref 29–41)
Hemoglobin: 15.5
Immature Granulocytes: 0
Lymphs Abs: 3.7
MCH: 31.1
MCHC: 33.5
MCV: 93 (ref 76–111)
Monocytes(Absolute): 0.9
Monocytes: 8
Neutro Abs: 6.8
Neutrophils: 59
RBC: 4.98 (ref 3.87–5.11)
RDW: 14.2
WBC: 11.7
lymph#: 31
platelet count: 251

## 2019-11-05 ENCOUNTER — Other Ambulatory Visit: Payer: Self-pay | Admitting: Cardiology

## 2019-11-05 DIAGNOSIS — E785 Hyperlipidemia, unspecified: Secondary | ICD-10-CM | POA: Diagnosis not present

## 2019-11-05 DIAGNOSIS — G4733 Obstructive sleep apnea (adult) (pediatric): Secondary | ICD-10-CM | POA: Diagnosis not present

## 2019-11-05 DIAGNOSIS — J449 Chronic obstructive pulmonary disease, unspecified: Secondary | ICD-10-CM | POA: Diagnosis not present

## 2019-11-05 DIAGNOSIS — I739 Peripheral vascular disease, unspecified: Secondary | ICD-10-CM

## 2019-11-05 DIAGNOSIS — I2510 Atherosclerotic heart disease of native coronary artery without angina pectoris: Secondary | ICD-10-CM | POA: Diagnosis not present

## 2019-11-25 ENCOUNTER — Ambulatory Visit: Payer: Medicare Other | Admitting: Gastroenterology

## 2019-12-03 ENCOUNTER — Ambulatory Visit: Payer: Medicare Other | Admitting: Cardiology

## 2020-01-06 ENCOUNTER — Ambulatory Visit: Payer: Medicare Other | Admitting: Gastroenterology

## 2020-04-15 ENCOUNTER — Telehealth: Payer: Self-pay

## 2020-04-15 NOTE — Telephone Encounter (Signed)
-----   Message from Irving Copas., MD sent at 04/15/2020  5:16 AM EDT ----- Regarding: Follow-up [name],We need to see what is going on with this patient.His stent has been in place for over 6 months and needs to be replaced or removed.Please let me know what you find out and get him scheduled for an ERCP.Thank you.GM

## 2020-04-16 NOTE — Telephone Encounter (Signed)
The pt was offered an appt on 4/29 at 350 pm but declined.  Appt made for 5/4 at 350 pm.

## 2020-04-16 NOTE — Telephone Encounter (Signed)
Still has the plastic stent and he needs it out. I am happy to see him if he wants to discuss, but he really can't keep that stent in forever. If he wants to be seen in clinic, please schedule/overbook because I don't want it to be weeks later. Thanks. GM

## 2020-04-16 NOTE — Telephone Encounter (Signed)
Dr Rush Landmark the pt was called and [name] states [name] is doing very well and does not have any problems or concerns. [name] also states that [name] has had no problems with his gall bladder and is not sure if [name] needs to have the ERCP.  Would you like me to set him up an office visit to discuss?

## 2020-04-27 ENCOUNTER — Ambulatory Visit: Payer: Medicare Other | Admitting: Gastroenterology

## 2020-04-27 ENCOUNTER — Encounter: Payer: Self-pay | Admitting: Gastroenterology

## 2020-04-27 VITALS — BP 120/74 | HR 97 | Temp 98.5°F | Ht 76.0 in | Wt 240.0 lb

## 2020-04-27 DIAGNOSIS — K8050 Calculus of bile duct without cholangitis or cholecystitis without obstruction: Secondary | ICD-10-CM

## 2020-04-27 DIAGNOSIS — Z9889 Other specified postprocedural states: Secondary | ICD-10-CM | POA: Diagnosis not present

## 2020-04-27 DIAGNOSIS — K8020 Calculus of gallbladder without cholecystitis without obstruction: Secondary | ICD-10-CM

## 2020-04-27 DIAGNOSIS — R1084 Generalized abdominal pain: Secondary | ICD-10-CM | POA: Diagnosis not present

## 2020-04-28 ENCOUNTER — Encounter: Payer: Self-pay | Admitting: Gastroenterology

## 2020-04-28 DIAGNOSIS — K8020 Calculus of gallbladder without cholecystitis without obstruction: Secondary | ICD-10-CM | POA: Insufficient documentation

## 2020-04-28 NOTE — Progress Notes (Signed)
[facility] VISIT   Primary Care Provider Kinnie Feil, MD [address] [place] [address] [phone]  Patient Profile: [name] is a 74 y.o. 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  The patient presents to the [facility] for an evaluation and management of problem(s) noted below:  Problem List 1. Choledocholithiasis   2. Calculus of gallbladder without cholecystitis without current obstruction   3. History of biliary stent insertion   4. History of ERCP   5. Chronic Generalized abdominal pain     History of Present Illness Please see initial consultation note for full details of HPI.  Interval History The patient returns today for discussion in the setting of his indwelling biliary stent.  It has been nearly 6 months since his ERCP and he is due for stent exchange prior to it becoming occluded.  My goal had previously been for him to be evaluated by surgery for cholecystectomy and subsequently then for me to remove the biliary stent.  For some reason, the patient never followed up with surgery and still has his gallbladder in place.  The patient overall, is feeling well.  However he has multiple stressors in his life including his ex-wife who is dealing with multiple GI issues as well as a daughter who was recently diagnosed with cancer.  He is the main caretaker for his family at this point in time and so is finding a lot of stress from that.  He has his chronic abdominal discomforts in his abdomen flanks but there is no worsening of it and if anything he has been doing better over the course the last few months  since our last evaluation in clinic.  No changes in bowel habits.  The patient never was able to follow-up with surgery.  GI Review of Systems Positive as above Negative for odynophagia, dysphagia, change in bowel habits, melena, hematochezia  Review of Systems General: Denies fevers/chills Cardiovascular: Denies chest pain/palpitations Pulmonary: Denies shortness of breath Gastroenterological: See HPI Genitourinary: Denies darkened urine Hematological: Denies easy bruising/bleeding Dermatological: Denies jaundice Psychological: Mood is stable   Medications Current Outpatient Medications  Medication Sig Dispense Refill  . diltiazem (CARDIZEM CD) 120 MG 24 hr capsule Take 1 capsule (120 mg total) by mouth daily with breakfast. 60 capsule 1  . ipratropium-albuterol (DUONEB) 0.5-2.5 (3) MG/3ML SOLN Take 3 mLs by nebulization every 6 (six) hours as needed (for wheezing/shortness of breath).    . latanoprost (XALATAN) 0.005 % ophthalmic solution Place 1 drop into both eyes daily at 10 pm.    . OXYGEN Inhale 2 L into the lungs. Nightly and as needed    . timolol (TIMOPTIC) 0.5 % ophthalmic solution Place 1 drop into both eyes daily.     No current facility-administered medications for this visit.   Facility-Administered Medications Ordered in Other Visits  Medication Dose Route Frequency Provider Last Rate Last Admin  . heparin lock flush 100 unit/mL  500 Units Intravenous Once Sinda Du, MD      . sodium chloride 0.9 % injection 10 mL  10 mL Intravenous PRN Sinda Du, MD        Allergies Allergies  Allergen Reactions  . Pred Forte [Prednisolone Acetate] Other (See Comments)  Severe burning  . Atorvastatin Other (See Comments)    Shaky, dizziness, headaches  . Codeine Other (See Comments)    REACTION: Shakes     Histories Past Medical History:  Diagnosis Date  . Atrial fibrillation ([place])   . Avascular necrosis of bones of both hips ([place])   . 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 Cancer ([place])   . Colonic polyp   . Complication of anesthesia    hard to wake up, "felt like died and revived me"  . COPD (chronic obstructive pulmonary disease) ([facility])    mild PFT abnormalities; normal ABG  . Degenerative disc disease, lumbar    HNP of the LS spine  . Diabetes mellitus    type 2  . GERD (gastroesophageal reflux disease)   . Glaucoma   . H/O hiatal hernia   . Hx MRSA infection 2010   sith splenic abcess  . Non Hodgkin's lymphoma ([place]) 06/2009   chemotherapy until 11/2010  . Obesity   . Obstructive sleep apnea    treated with CPAP  . Orthostatic hypotension    lightheadness; no definate loss of consciousness  . Pedal edema   . PONV (postoperative nausea and vomiting)   . Splenic abscess 07/2009  . Tobacco abuse    45 pack years   Past Surgical History:  Procedure Laterality Date  . ABSCESS DRAINAGE  2010   Splenic  . ANAL FISSURE 22  74years old  . BILIARY DILATION  09/02/2019   Procedure: BILIARY DILATION;  Surgeon: Rush Landmark Telford Nab., MD;  Location: [facility];  Service: Gastroenterology;;  . BILIARY STENT PLACEMENT N/A 09/02/2019   Procedure: BILIARY STENT PLACEMENT;  Surgeon: Irving Copas., MD;  Location: [facility];  Service: Gastroenterology;  Laterality: N/A;  . BIOPSY  09/02/2019   Procedure: BIOPSY;  Surgeon: Rush Landmark Telford Nab., MD;  Location: [facility];  Service: Gastroenterology;;  . CATARACT EXTRACTION     Right with lens implant  . COLONOSCOPY W/ POLYPECTOMY  2008   with snare polypectomy  . ERCP N/A 09/02/2019   Procedure: ENDOSCOPIC RETROGRADE CHOLANGIOPANCREATOGRAPHY (ERCP);  Surgeon: Irving Copas., MD;  Location: [facility];  Service: Gastroenterology;  Laterality: N/A;  . EYE SURGERY     lens implant  . INGUINAL HERNIA REPAIR Left 03/09/2017   Procedure: OPEN REPAIR LEFT INGUINAL HERNIA;  Surgeon: Arta Bruce Kinsinger, MD;  Location: [facility];  Service: General;  Laterality: Left;  .  INSERTION OF MESH Left 03/09/2017   Procedure: INSERTION OF MESH;  Surgeon: Arta Bruce Kinsinger, MD;  Location: [facility];  Service: General;  Laterality: Left;  . KNEE ARTHROSCOPY     right  . LYMPH NODE BIOPSY  2010   done x 2 for NHL diagnosis  . REMOVAL OF STONES  09/02/2019   Procedure: REMOVAL OF STONES;  Surgeon: Rush Landmark Telford Nab., MD;  Location: [facility];  Service: Gastroenterology;;  . [name]  09/02/2019   Procedure: [name];  Surgeon: Irving Copas., MD;  Location: [facility];  Service: Gastroenterology;;  . TONSILLECTOMY    . TOTAL HIP ARTHROPLASTY  12/13/2012   Procedure: TOTAL HIP ARTHROPLASTY ANTERIOR APPROACH;  Surgeon: Mcarthur Rossetti, MD;  Location: [facility];  Service: Orthopedics;  Laterality: Left;  Left Total Hip Arthroplasty, Anterior Approach (C-Arm)   Social History   Socioeconomic History  . Marital status: Divorced    Spouse name: Not on file  . Number of children: 2  . Years of education: Not  on file  . Highest education level: Not on file  Occupational History  . Occupation: retired/disability  Tobacco Use  . Smoking status: Current Every Day Smoker    Packs/day: 1.00    Years: 55.00    Pack years: 55.00    Types: Cigarettes  . Smokeless tobacco: Never Used  Substance and Sexual Activity  . Alcohol use: No  . Drug use: No  . Sexual activity: Not on file  Other Topics Concern  . Not on file  Social History Narrative  . Not on file   Social Determinants of Health   Financial Resource Strain:   . Difficulty of Paying Living Expenses:   Food Insecurity:   . Worried About Charity fundraiser in the Last Year:   . Arboriculturist in the Last Year:   Transportation Needs:   . Film/video editor (Medical):   Marland Kitchen Lack of Transportation (Non-Medical):   Physical Activity:   . Days of Exercise per Week:   . Minutes of Exercise per Session:   Stress:   . Feeling of Stress :   Social Connections:   . Frequency  of Communication with Friends and Family:   . Frequency of Social Gatherings with Friends and Family:   . Attends Religious Services:   . Active Member of Clubs or Organizations:   . Attends Archivist Meetings:   Marland Kitchen Marital Status:   Intimate Partner Violence:   . Fear of Current or Ex-Partner:   . Emotionally Abused:   Marland Kitchen Physically Abused:   . Sexually Abused:    Family History  Problem Relation Age of Onset  . Heart failure Mother 46       results of chf  . Leukemia Father   . Colon cancer Sister   . Heart disease Brother   . Heart disease Brother   . Heart disease Brother   . Heart disease Brother   . Esophageal cancer Neg Hx   . Inflammatory bowel disease Neg Hx   . Liver disease Neg Hx   . Pancreatic cancer Neg Hx   . Stomach cancer Neg Hx    I have reviewed his medical, social, and family history in detail and updated the electronic medical record as necessary.    PHYSICAL EXAMINATION  BP 120/74   Pulse 97   Temp 98.5 F (36.9 C)   Ht 6\' 4"  (1.93 m)   Wt 240 lb (108.9 kg)   BMI 29.21 kg/m  Wt Readings from Last 3 Encounters:  04/27/20 240 lb (108.9 kg)  10/09/19 243 lb (110.2 kg)  10/09/19 243 lb 9.6 oz (110.5 kg)  GEN: NAD, appears stated age, doesn't appear chronically ill PSYCH: Cooperative, without pressured speech EYE: Conjunctivae pink, sclerae anicteric ENT: MMM CV: Nontachycardic RESP: Audible wheezing remains apparent GI: NABS, soft, protuberant abdomen, rounded, obese, tenderness to palpation noted upon very deep palpation of the flanks bilaterally but improved from prior MSK/EXT: Trace bilateral lower extremity edema SKIN: No jaundice NEURO:  Alert & Oriented x 3, no focal deficits   REVIEW OF DATA  I reviewed the following data at the time of this encounter:  GI Procedures and Studies  Previously reviewed ERCP from September 2020  Laboratory Studies  Reviewed those in epic  Imaging Studies  No new imaging studies to  review   ASSESSMENT  Mr. Kamm is a 74 y.o. 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  The patient is seen today for evaluation and management of:  1. Choledocholithiasis   2. Calculus of gallbladder without cholecystitis without current obstruction   3. History of biliary stent insertion   4. History of ERCP   5. Chronic Generalized abdominal pain    Patient is hemodynamically stable.  Clinically he has done well with my biliary stent in place.  However, we cannot keep it for an extended period in time it is nearly 6 months since its placement.  Patient never underwent cholecystectomy.  We will place a referral back to surgery so that they can consider surgical resection of his gallbladder.  I plan to pursue ERCP with stent removal and ensure that biliary flow is good, I will consider the role of replacing a stent for him but I really would like to try and get this out.  Obviously, he will be at risk of stones leaving his gallbladder into the bile duct so hopefully we can leave the area open and clear but if it is going to be an extended time period between his potential cholecystectomy I may just replace the stent.  The risks of an ERCP were discussed at length, including but not limited to the risk of perforation, bleeding, abdominal pain, post-ERCP pancreatitis (while usually mild can be severe and even life threatening).  I will not be moving forward with TCA or neuromodulation for now in regards to his chronic GI symptoms as he is actually doing okay.  Our surgical colleagues can further discuss with the patient potential surgical intervention for his large ventral hernia.  I will see him for  his follow-up ERCP in the coming weeks.  All patient questions were answered, to the best of my ability, and the patient agrees to the aforementioned plan of action with follow-up as indicated.   PLAN  Proceed with scheduling ERCP for stent removal with hope that I would not have to leave another stent in place and balloon sweep Replaced referral to [facility] surgery Dr. Marcello Moores to move forward with cholecystectomy as had been the previous plan and to also consider role of ventral hernia repair Holding on TCA or neuromodulation for chronic abdominal pain since symptoms are stable at this time    Orders Placed This Encounter  Procedures  . Procedural/ Surgical Case Request: ENDOSCOPIC RETROGRADE CHOLANGIOPANCREATOGRAPHY (ERCP) WITH PROPOFOL, BILIARY STENT PLACEMENT- removal  . Ambulatory referral to Gastroenterology  . Ambulatory referral to General Surgery    New Prescriptions   No medications on file   Modified Medications   No medications on file    Planned Follow Up No follow-ups on file.   Total Time in Face-to-Face and in Coordination of Care for patient including independent/personal interpretation/review of prior testing, medical history, examination, medication adjustment, communicating results with the patient directly, and documentation with the EHR is 25 minutes.   Justice Britain, MD [facility] Office # [phone]

## 2020-05-10 ENCOUNTER — Telehealth: Payer: Self-pay

## 2020-05-10 NOTE — Telephone Encounter (Signed)
Pt has been scheduled for 05/26/20 ERCP with stent removal at [facility]. TOA 8:45am /PT: 10:15am. Pt has been instructed over the phone as well as information mailed to pt. Pt voiced understanding and knows to call office with any questions.

## 2020-05-22 ENCOUNTER — Other Ambulatory Visit (HOSPITAL_COMMUNITY)
Admission: RE | Admit: 2020-05-22 | Discharge: 2020-05-22 | Disposition: A | Discharge status: Home / Self Care | Payer: Medicare Other | Source: Ambulatory Visit | Attending: Gastroenterology | Admitting: Gastroenterology

## 2020-05-22 DIAGNOSIS — Z01812 Encounter for preprocedural laboratory examination: Secondary | ICD-10-CM | POA: Diagnosis not present

## 2020-05-22 DIAGNOSIS — Z20822 Contact with and (suspected) exposure to covid-19: Secondary | ICD-10-CM | POA: Insufficient documentation

## 2020-05-22 LAB — SARS CORONAVIRUS 2 (TAT 6-24 HRS): SARS Coronavirus 2: NEGATIVE

## 2020-05-25 ENCOUNTER — Other Ambulatory Visit: Payer: Self-pay

## 2020-05-25 ENCOUNTER — Encounter (HOSPITAL_COMMUNITY): Payer: Self-pay | Admitting: Vascular Surgery

## 2020-05-25 ENCOUNTER — Telehealth: Payer: Self-pay

## 2020-05-25 ENCOUNTER — Encounter: Payer: Self-pay | Admitting: Cardiology

## 2020-05-25 ENCOUNTER — Encounter (HOSPITAL_COMMUNITY): Payer: Self-pay | Admitting: Gastroenterology

## 2020-05-25 ENCOUNTER — Telehealth (INDEPENDENT_AMBULATORY_CARE_PROVIDER_SITE_OTHER): Payer: Medicare Other

## 2020-05-25 DIAGNOSIS — Z01810 Encounter for preprocedural cardiovascular examination: Secondary | ICD-10-CM | POA: Diagnosis not present

## 2020-05-25 NOTE — Telephone Encounter (Signed)
Dr. Einar Gip please advise.    Request for surgical clearance:     Endoscopy   What type of surgery is being performed?     ERCP with pos Stent removal   When is this surgery scheduled?     TBD  What type of clearance is required ?  Cardiac Clearance   Are there any medications that need to be held prior to surgery and how long? No medications.  Procedure was cancelled for 05/26/20. Please see Progress note from 05/25/20 Myra Gianotti PA- anesthesiologist requesting cardiac follow-up before procedure.   Practice name and name of physician performing surgery?      [facility]-Dr.Mansouraty  What is your office phone and fax number?      Phone- [phone]  [phone]  Anesthesia type (None, local, MAC, general) ?       MAC

## 2020-05-25 NOTE — Progress Notes (Signed)
Pt denies SOB and chest pain. Pt stated that he is under the care of Dr. Einar Gip, Cardiology. Pt denies having a PCP currently. Pt denies having a cardiac cath. Pt denies having a chest x ray. Pt denies recent labs. Pt made aware to stop taking Aspirin (unless otherwise advised by surgeon), vitamins, fish oil and herbal medications. Do not take any NSAIDs ie: Ibuprofen, Advil, Naproxen (Aleve), Motrin, BC and Goody Powder. Pt stated that Metformin is on hold . Pt stated that he does not check his CBG. Pt reminded to quarantine. Pt verbalized understanding of all pre-op instructions.

## 2020-05-25 NOTE — Telephone Encounter (Signed)
Recd call from [place]- PA with Anesthesia from the hospital([facility]). Pt needs cardiac clearance again before he can be cleared for Anesthesia. Pt has been informed that procedure has been cancelled for 05/26/20. Pt was not happy and stated that he had already been cleared and would not have any other cardiac testing done at this point. I explained that unfortunately it is not our decision. I let pt know that Dr. Rush Landmark did speak with Anesthesiologist  as well and case was reviewed with same outcome. I will send request to Dr. Einar Gip asking for cardiac clearance. Pt voiced understanding and will await a call from our office.

## 2020-05-25 NOTE — Progress Notes (Signed)
Anesthesia Chart Review: SAME DAY WORK-UP (ENDO)  Case: [id number] Date/Time: 05/26/20 0945   Procedures:      ENDOSCOPIC RETROGRADE CHOLANGIOPANCREATOGRAPHY (ERCP) WITH PROPOFOL (N/A )     BILIARY STENT PLACEMENT- removal (N/A )   Anesthesia type: General   Pre-op diagnosis: Hx of Choledolithiasis, Hx of Biliary Stent   Location: [facility]OM [facility] / [facility]   Surgeons: Mansouraty, Telford Nab., MD      DISCUSSION: Patient is a 74 year old male scheduled for the above procedure.  History includes postoperative N/V, smoking, COPD (oxygen 2L as needed), afib (diagnosed 2012), DM2, OSA (CPAP), GERD, glaucoma, hiatal hernia, Non-Hodgkin's lymphoma (diagnosed 08/20/09 via splenic biopsy and pancreatic tall mass biopsy; s/p  7 cycles R-CHOP chemotherapy), orthostatic hypotension, left THA (12/13/12).  For anesthesia history, it was documented on 12/09/12 that he reported "hard to wake up, 'felt like died and revived me'". He has since undergone left THA 12/13/12 and open left inguinal hernia repair 03/09/17.  Cardiologist is Dr. Einar Gip. Last evaluation 10/09/19 for hospital follow-up and preoperative evaluation. He was admitted in September 2020 with acute cholecystitis. S/p ERCP 09/02/19 with sphincterotomy, balloon dilation, stone extraction, stent placement with drainage of bile/pus. Was being evaluated for cholecystectomy. Patient with known afib history, but not on anticoagulation therapy at that time. Preoperative cardiac work-up included an echo and Lexiscan Myoview stress test which was intermediate risk (LVEF 50-60%; moderate reversible myocardial perfusion defect in the inferior, inferolateral, and inferoapical walls of the left ventricle,consistent with inducible myocardial ischemia). Dr. Einar Gip felt Mr. Trumbull could proceed with surgery then, but ultimately general surgery desired to delay surgery to allow further recovery for acute infection of cholangitis and improved his nutritional status.  Anticoagulation therapy recommended with temporary hold for surgery.  I don't see that he ever underwent cholecystectomy, but as of 10/09/19 visit with Dr. Einar Gip he wrote, "He probably has underlying CAD in view of abnormal stress test and his underlying cardiovascular risk factors including tobacco use disorder.  I'll start him on Lipitor, previously he had not tolerated Crestor due to severe myalgias.  This is for cardiovascular protection. Although he has had abnormal nuclear stress test, I will send for surgical clearance, with moderate risk for cardiac morbidity and mortality.  I met his daughter and explained that in view of ongoing abdominal discomfort that it is best to proceed with surgery hopefully it'll be laparoscopic.  She understands the risks associated with this including peri-porcedural vascular events and 1-2% mortality risk." He had planned on seeing patient in 2 months for follow-up and PAD evaluation. Mr. Nawrot is no longer on Lipitor or Xarelto.   In April, GI contacted patient about removing of replacing his plastic biliary stent since it had been in for > 6 months and want to avoid it becoming occluded. Last seen by Dr. Rush Landmark on 04/27/20. Patient no longer on Xarelto and had not followed up with general surgery for cholecystectomy given his symptoms had improved. Removal of stent recommended (and replacement if indicated). He was referred back to Dr. Marcello Moores with [facility] (general surgery).   Reviewed above with anesthesiologist Albertha Ghee, MD. Patient with suspected underlying CAD with abnormal stress test in 08/2019. Patient felt okay to proceed with cholecystectomy at that time given his acute GI symptoms; 2 month cardiology follow-up was recommended in 09/2019 but this has not happened. Given he is not longer having acute cholecystitis symptoms, anesthesiologist would recommend re-evaluation with cardiology to clarify whether or not additional cardiac testing would  now be indicated  prior to elective procedure. I have spoken with Dr. Rush Landmark and his nurse, Tora Duck. She is notifying Mr. Verret and will assist with getting cardiology follow-up.     VS:   Wt Readings from Last 3 Encounters:  04/27/20 108.9 kg  10/09/19 110.2 kg  10/09/19 110.5 kg   BP Readings from Last 3 Encounters:  04/27/20 120/74  10/09/19 124/67  10/09/19 132/80   Pulse Readings from Last 3 Encounters:  04/27/20 97  10/09/19 84  10/09/19 79    PROVIDERS: Patient, No Pcp Per (was Lennox Solders, MD, recently retired; 11/05/19 office note scanned under Media tab) Adrian Prows, MD is cardiologist [facility]) Leighton Ruff, MD is general surgeon   LABS: Labs per GI/Anesthesia. As of 10/09/19, Cr 1.16, H/H 15.3/46.4, PLT 226, glucose 97.    IMAGES: 1V PCXR 09/06/19: FINDINGS: Lung volumes are normal. Mild diffuse peribronchial cuffing. No consolidative airspace disease. No pleural effusions. No pneumothorax. No pulmonary nodule or mass noted. Pulmonary vasculature and the cardiomediastinal silhouette are within normal limits. IMPRESSION: 1. Mild diffuse peribronchial cuffing which may suggest an acute bronchitis.   EKG: EKG 10/09/2019 [facility] CV): As outlined by Dr. Einar Gip: Atrial fibrillation with controlled ventricular response at the rate of 87 bpm, normal axis, poor R-wave progression, probably normal variant.  Cannot exclude anteroseptal infarct old.  Normal QT interval.  EKG 09/06/2019: Atrial fibrillation Low voltage QRS Abnormal ECG No significant change since last tracing Confirmed by Skeet Latch [phone]) on 09/07/2019 8:27:28 AM   CV: Nuclear stress test 09/04/19: IMPRESSION: 1. Moderate reversible myocardial perfusion defect in the inferior, inferolateral, and inferoapical walls of the left ventricle, consistent with inducible myocardial ischemia. 2. Normal left ventricular wall motion. 3. Left ventricular ejection fraction 50% 4. Non  invasive risk stratification*: Intermediate *2012 Appropriate Use Criteria for Coronary Revascularization Focused Update: J Am Coll Cardiol. 7588;32(5):498-264. http://content.airportbarriers.com.aspx?articleid=[email or web address]   Echo 09/01/19: - Sonographer Comments: Patient is morbidly obese, suboptimal apical window,  suboptimal parasternal window and Technically difficult study due to poor  echo windows. Patient could not lean back due to nausea and pain.  IMPRESSIONS  1. The left ventricle has low normal systolic function, with an ejection  fraction of 55-60%. The cavity size was normal. There is moderately  increased left ventricular wall thickness. Left ventricular diastolic  function could not be evaluated secondary  to atrial fibrillation. Inadequat for regional wall motion assessment.  2. The right ventricle has normal systolic function. The cavity was  normal. There is no increase in right ventricular wall thickness.  3. Moderate thickening of the mitral valve leaflet. Mild calcification of  the mitral valve leaflet. There is moderate mitral annular calcification  present.  4. The aortic valve is tricuspid. Mild sclerosis of the aortic valve.  Aortic valve regurgitation was not assessed by color flow Doppler.  5. The aorta is normal unless otherwise noted.  6. The interatrial septum appears to be lipomatous.  7. Trivial pericardial effusion is present.  8. The pericardial effusion is circumferential.    Carotid US 04/24/06: IMPRESSION: 1.  No hemodynamically significant ICA stenosis.   2.  Patent vertebral arteries bilaterally.  3.  No significant carotid atherosclerosis by ultrasound.   Past Medical History:  Diagnosis Date  . Atrial fibrillation ([place])   . Avascular necrosis of bones of both hips ([place])   . 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 Cancer ([place])   . Colonic polyp   . Complication of anesthesia  hard to wake up, "felt like died and revived me"  .  COPD (chronic obstructive pulmonary disease) ([facility])    mild PFT abnormalities; normal ABG  . Degenerative disc disease, lumbar    HNP of the LS spine  . Diabetes mellitus    type 2  . GERD (gastroesophageal reflux disease)   . Glaucoma   . H/O hiatal hernia   . Hx MRSA infection 2010   sith splenic abcess  . Non Hodgkin's lymphoma ([place]) 06/2009   chemotherapy until 11/2010  . Obesity   . Obstructive sleep apnea    treated with CPAP  . Orthostatic hypotension    lightheadness; no definate loss of consciousness  . Pedal edema   . PONV (postoperative nausea and vomiting)   . Splenic abscess 07/2009  . Tobacco abuse    45 pack years    Past Surgical History:  Procedure Laterality Date  . ABSCESS DRAINAGE  2010   Splenic  . ANAL FISSURE 21  74years old  . BILIARY DILATION  09/02/2019   Procedure: BILIARY DILATION;  Surgeon: Rush Landmark Telford Nab., MD;  Location: [facility];  Service: Gastroenterology;;  . BILIARY STENT PLACEMENT N/A 09/02/2019   Procedure: BILIARY STENT PLACEMENT;  Surgeon: Irving Copas., MD;  Location: [facility];  Service: Gastroenterology;  Laterality: N/A;  . BIOPSY  09/02/2019   Procedure: BIOPSY;  Surgeon: Rush Landmark Telford Nab., MD;  Location: [facility];  Service: Gastroenterology;;  . CATARACT EXTRACTION     Right with lens implant  . COLONOSCOPY W/ POLYPECTOMY  2008   with snare polypectomy  . ERCP N/A 09/02/2019   Procedure: ENDOSCOPIC RETROGRADE CHOLANGIOPANCREATOGRAPHY (ERCP);  Surgeon: Irving Copas., MD;  Location: [facility];  Service: Gastroenterology;  Laterality: N/A;  . EYE SURGERY     lens implant  . INGUINAL HERNIA REPAIR Left 03/09/2017   Procedure: OPEN REPAIR LEFT INGUINAL HERNIA;  Surgeon: Arta Bruce Kinsinger, MD;  Location: [facility];  Service: General;  Laterality: Left;  . INSERTION OF MESH Left 03/09/2017   Procedure: INSERTION OF MESH;  Surgeon: Arta Bruce Kinsinger, MD;  Location: [facility];  Service: General;   Laterality: Left;  . KNEE ARTHROSCOPY     right  . LYMPH NODE BIOPSY  2010   done x 2 for NHL diagnosis  . REMOVAL OF STONES  09/02/2019   Procedure: REMOVAL OF STONES;  Surgeon: Rush Landmark Telford Nab., MD;  Location: [facility];  Service: Gastroenterology;;  . [name]  09/02/2019   Procedure: [name];  Surgeon: Irving Copas., MD;  Location: [facility];  Service: Gastroenterology;;  . TONSILLECTOMY    . TOTAL HIP ARTHROPLASTY  12/13/2012   Procedure: TOTAL HIP ARTHROPLASTY ANTERIOR APPROACH;  Surgeon: Mcarthur Rossetti, MD;  Location: [facility];  Service: Orthopedics;  Laterality: Left;  Left Total Hip Arthroplasty, Anterior Approach (C-Arm)    MEDICATIONS: No current facility-administered medications for this encounter.   Marland Kitchen acetaminophen (TYLENOL) 500 MG tablet  . diltiazem (CARDIZEM CD) 120 MG 24 hr capsule  . ipratropium-albuterol (DUONEB) 0.5-2.5 (3) MG/3ML SOLN  . latanoprost (XALATAN) 0.005 % ophthalmic solution  . lidocaine (LIDODERM) 5 %  . timolol (TIMOPTIC) 0.5 % ophthalmic solution  . vitamin B-12 (CYANOCOBALAMIN) 1000 MCG tablet  . OXYGEN   . heparin lock flush 100 unit/mL  . sodium chloride 0.9 % injection 10 mL     Myra Gianotti, PA-C Surgical Short Stay/Anesthesiology [facility] Phone [phone] [facility] Phone [phone] 05/25/2020 5:05 PM

## 2020-05-25 NOTE — Telephone Encounter (Signed)
Sent letter to proceed. Please see letters. JG

## 2020-05-26 ENCOUNTER — Ambulatory Visit (HOSPITAL_COMMUNITY): Admission: RE | Admit: 2020-05-26 | Payer: Medicare Other | Source: Home / Self Care | Admitting: Gastroenterology

## 2020-05-26 ENCOUNTER — Encounter (HOSPITAL_COMMUNITY): Payer: Self-pay | Admitting: Vascular Surgery

## 2020-05-26 HISTORY — DX: Complete loss of teeth, unspecified cause, unspecified class: K08.109

## 2020-05-26 HISTORY — DX: Pneumonia, unspecified organism: J18.9

## 2020-05-26 HISTORY — DX: Headache, unspecified: R51.9

## 2020-05-26 HISTORY — DX: Calculus of bile duct without cholangitis or cholecystitis without obstruction: K80.50

## 2020-05-26 HISTORY — DX: Dependence on supplemental oxygen: Z99.81

## 2020-05-26 SURGERY — ENDOSCOPIC RETROGRADE CHOLANGIOPANCREATOGRAPHY (ERCP) WITH PROPOFOL
Anesthesia: General

## 2020-05-26 NOTE — Telephone Encounter (Signed)
Patient was upset that the procedure got postponed.  I had seen him 6 months ago and he underwent ERCP on 09/02/2019, I had seen him on 10/09/2019, had an abnormal nuclear stress test revealing inferior ischemia but preserved LVEF and without angina or heart failure on exam.  However due to his multiple cardiovascular risk factors, I have started him on Lipitor for cardiovascular protection and after extensive discussion, he had opted for going for surgery even at moderate or high risk as he felt his life is miserable with continued abdominal discomfort and weight loss.  He was scheduled for stent removal and stage cholecystectomy however was evaluated by anesthesiologist and recommended cardiology evaluation.  When I try to schedule him, he was extremely upset that his procedure that was to be done today has been canceled as of last evening.  I offered him to see him at the earliest to really evaluate him to see whether my letters that had sent previously would still be appropriate and to proceed with procedure or if there is change in symptoms or exam, but he needs cardiac catheterization first.  Patient did not say 1 way or another whether he wants to make an appointment or not, I also suggested that he can go back to seeing my colleagues at [facility].  He kept repeating that I am his cardiologist.  This was a 20-minute telephone visit encounter with him trying to convince him that his surgery has already been postponed, that he has waited for 6 months, that best option is for me to see him and further restratify him with a clinically or with further testing.  At this point, it is left open for the patient to call us back if he wants to see [place].    ICD-10-CM   1. Preoperative cardiovascular examination  Z01.810     Adrian Prows, MD, [facility] 05/26/2020, 12:53 PM [facility]. [place] Office: [phone]

## 2020-05-26 NOTE — Telephone Encounter (Signed)
Patient was called again, patient refuses to come in.   (I had sent you a message about this patient as per why he doesn't want to come in)   Thank you  -[place]

## 2020-05-26 NOTE — Telephone Encounter (Signed)
Dr. Einar Gip, Thanks for the quick reply. I am forwarding this to Myra Gianotti from anesthesia who had discussed the patient's case with two of the cardiac anesthesiologist and who felt that they could not move forward without further evaluation by you in clinic.  [name], if you can please confirm that your anesthesia team still feels the same way about needing to be seen in clinic by Dr. Einar Gip before moving forward with ERCP and then certainly we will help to arrange that with Dr. Irven Shelling office by Dr. Einar Gip felt it was okay to move forward with the ERCP as per a notation in the chart but I have to defer to you all before we reschedule.  Please reply when able.  [name] we are in standby mode until we hear from our anesthesia team. Thanks. GM

## 2020-05-26 NOTE — Telephone Encounter (Signed)
I was able to his call and speak with the patient this afternoon. Went over the patient's frustrations about the procedure having to be postponed and rescheduled. After 20 minutes of discussion with the patient this afternoon, he understands why things were held up but still is upset. With that being said, he is okay with moving forward with seeing Dr. Einar Gip as he likes Dr. Einar Gip and the care he is provided previously. I think it would be best as Dr. Einar Gip had previously described as well for him to be evaluated in clinic.  From there Dr. Einar Gip can better assess the patient and help [place] understand if any additional work-up may be required prior to potential ERCP and eventually needing to be reseen by surgery for gallbladder resection. I am placing Dr. Einar Gip on this so that he may have his office reach out to the patient in the next 1 to 2 days and effort of getting him seen in clinic as soon as he has availability. As soon as Dr. Einar Gip has had an opportunity to evaluate the patient and consider if any additional work-up is necessary, I am happy to then work on getting him set up for his ERCP. Patient understands that we need to get this stent worked on sooner rather than later because it is getting longer than we would normally want to leave that stent in place and it increases the chance that he could have a complication or difficulty with removal so the sooner we get this done the better.  Thankfully, he has no evidence of cholangitis or obstructive biliary pathology. Thank you Dr. Einar Gip for seeing the patient. [place], please be on the look out for a follow-up after Dr. Einar Gip hopefully sees the patient in the next few weeks. Thanks. GM

## 2020-05-26 NOTE — Telephone Encounter (Signed)
Set up OV with me at the earliest

## 2020-05-26 NOTE — Telephone Encounter (Signed)
Can you please remind me

## 2020-05-26 NOTE — Telephone Encounter (Signed)
I was going to see him routinely after the procedure. He established with me 1 months after he underwent ERCP and stent placement and had done well in spite of abnormal stress test. Hence I did not feel he was high risk.   I spent 20 minutes with him today to see how I could be of help. I am fine for him to have the procedure and do not have to see pre op although things may have changed in 6 months. He states his health is the same and no new symptoms.   With regards to anticoagulation, I think he is off of it due to up coming procedure.  JG

## 2020-05-26 NOTE — Telephone Encounter (Signed)
Please cancel f/u appointments with me. Patient was offered further testing when I first met him and he said he would take any risk for having surgery as he was miserable. I had cleared him for surgery. Now that it is cancelled, he thinks that I did not do  further testing and holds me responsible for this. I have requested our staff to call him and to at least make appointment with [facility].  All this discussion is well documented in Epic OV notes by me. Also patient had not make f/u appointments. JG

## 2020-05-26 NOTE — Telephone Encounter (Signed)
-----   Message from [place] sent at 05/26/2020  8:44 AM EDT ----- Regarding: RE: Needs OV Spoke to the patient over the phone for a little over 10 minutes. Patient stated he is very "disappointed and pissed" He said that we only want his money. I assured him that we are not here for the money, we are here to help.   There is a lot of confusion between his other doctors of which he did not provide names of. Stated he had a gallbladder removal sx cancelled for this morning. Stated anesthesiologist cx bc he needed a stress test from our office. Stated that it took months for [place] to get this done.   I advised him that eh was a new patient back in October of 2020 and on December of 2020 he was supposed to come back for a follow up but he canceled the appt on November. He stated he didn't have an appt and that he never cx neither. He went on saying he has multiple doctor appts and that it is too much for him.Marland Kitchen His wife is sick and his daughter has cancer and "we" just want their money with no service.   He wants his money back for the last year of appts her paid for and nothing has gotten done.   He wants his gallbladder to remain the same and no stent to be added " he does not care anymore"         I tried calming patient down to the best of my ability but he really does not want to hear anything else. He refused to schedule an appointment with [place] or any other provider.    I asked if Dr Einar Gip can give him a courtesy call and he said ok.   Thank you  -Leda Quail  ----- Message ----- From: Adrian Prows, MD Sent: 05/25/2020   6:02 PM EDT To: [facility] Subject: Needs OV                                       Routine 6 month Visit.  JG

## 2020-05-26 NOTE — Telephone Encounter (Signed)
Team,  Please schedule an OV with me at the earliest. [place]

## 2020-05-27 NOTE — Telephone Encounter (Signed)
15 min is fine. I know the story

## 2020-05-27 NOTE — Progress Notes (Signed)
Primary Physician/Referring:  Kinnie Feil, MD  Patient ID: [name], male    DOB: Jul 16, 1946, 74 y.o.   MRN: [id number]  Chief Complaint  Patient presents with  . Permanent Atrial Fibrillation  . Follow-up    8 month  . Pre-op Exam   HPI:    [name]  is a 74 y.o. Caucasian male with GERD, history of non-Hodgkin's lymphoma, hyperglycemia, paroxysmal atrial fibrillation, tobacco use disorder with COPD, obstructive sleep apnea on CPAP, PAD.  He had initially presented with sepsis secondary to cholangitis and biliary duct dilatation, nuclear stress test on 09/04/2019 had revealed mild inferior ischemia.  In view of his multiple risk factors, acute sepsis, cholecystectomy was postponed, he underwent ERCP and stent implantation on 09/02/2019.  He has now been scheduled for retrieval of the stent following which he is to undergo cholecystectomy.  Due to his high risk features, I have been requested to reclassify his cardiovascular risk. Patient's activity level is limited due to degenerative joint disease. He continues to have dyspnea and also symptoms of bilateral leg claudication right leg worse than the left. Denies chest pain.   Past Medical History:  Diagnosis Date  . Atrial fibrillation ([place])   . Avascular necrosis of bones of both hips ([place])   . 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 Cancer ([place])   . Choledocholithiasis    HX  . Colonic polyp   . Complication of anesthesia    hard to wake up, "felt like died and revived me"  . COPD (chronic obstructive pulmonary disease) (H[facility])    mild PFT abnormalities; normal ABG  . Degenerative disc disease, lumbar    HNP of the LS spine  . Diabetes mellitus    type 2  . Full dentures   . GERD (gastroesophageal reflux disease)   . Glaucoma   . H/O hiatal hernia   . Headache   . Hx MRSA infection 2010   sith splenic abcess  . Non Hodgkin's lymphoma ([place]) 06/2009   chemotherapy until 11/2010  . Obesity   . Obstructive sleep  apnea    treated with CPAP and oxygen  . On supplemental oxygen therapy    wears at HS and PRN  . Orthostatic hypotension    lightheadness; no definate loss of consciousness  . Pedal edema   . Permanent atrial fibrillation ([place]) 01/10/2011   Low thromboembolic risk   . Pneumonia   . PONV (postoperative nausea and vomiting)   . Splenic abscess 07/2009  . Tobacco abuse    45 pack years   Past Surgical History:  Procedure Laterality Date  . ABSCESS DRAINAGE  2010   Splenic  . ANAL FISSURE 100  74years old  . BILIARY DILATION  09/02/2019   Procedure: BILIARY DILATION;  Surgeon: Rush Landmark Telford Nab., MD;  Location: [facility];  Service: Gastroenterology;;  . BILIARY STENT PLACEMENT N/A 09/02/2019   Procedure: BILIARY STENT PLACEMENT;  Surgeon: Irving Copas., MD;  Location: [facility];  Service: Gastroenterology;  Laterality: N/A;  . BIOPSY  09/02/2019   Procedure: BIOPSY;  Surgeon: Rush Landmark Telford Nab., MD;  Location: [facility];  Service: Gastroenterology;;  . CATARACT EXTRACTION     Right with lens implant  . COLONOSCOPY W/ POLYPECTOMY  2008   with snare polypectomy  . ERCP N/A 09/02/2019   Procedure: ENDOSCOPIC RETROGRADE CHOLANGIOPANCREATOGRAPHY (ERCP);  Surgeon: Irving Copas., MD;  Location: [facility];  Service: Gastroenterology;  Laterality: N/A;  .  EYE SURGERY     lens implant  . INGUINAL HERNIA REPAIR Left 03/09/2017   Procedure: OPEN REPAIR LEFT INGUINAL HERNIA;  Surgeon: Arta Bruce Kinsinger, MD;  Location: [facility];  Service: General;  Laterality: Left;  . INSERTION OF MESH Left 03/09/2017   Procedure: INSERTION OF MESH;  Surgeon: Arta Bruce Kinsinger, MD;  Location: [facility];  Service: General;  Laterality: Left;  . KNEE ARTHROSCOPY     right  . LYMPH NODE BIOPSY  2010   done x 2 for NHL diagnosis  . MULTIPLE TOOTH EXTRACTIONS    . REMOVAL OF STONES  09/02/2019   Procedure: REMOVAL OF STONES;  Surgeon: Rush Landmark Telford Nab., MD;  Location: [facility];  Service: Gastroenterology;;  . [name]  09/02/2019   Procedure: [name];  Surgeon: Irving Copas., MD;  Location: [facility];  Service: Gastroenterology;;  . TONSILLECTOMY    . TOTAL HIP ARTHROPLASTY  12/13/2012   Procedure: TOTAL HIP ARTHROPLASTY ANTERIOR APPROACH;  Surgeon: Mcarthur Rossetti, MD;  Location: [facility];  Service: Orthopedics;  Laterality: Left;  Left Total Hip Arthroplasty, Anterior Approach (C-Arm)   Family History  Problem Relation Age of Onset  . Heart failure Mother 59       results of chf  . Leukemia Father   . Colon cancer Sister   . Heart disease Brother   . Heart disease Brother   . Heart disease Brother   . Heart disease Brother   . Esophageal cancer Neg Hx   . Inflammatory bowel disease Neg Hx   . Liver disease Neg Hx   . Pancreatic cancer Neg Hx   . Stomach cancer Neg Hx    Social History   Tobacco Use  . Smoking status: Current Every Day Smoker    Packs/day: 1.00    Years: 55.00    Pack years: 55.00    Types: Cigarettes  . Smokeless tobacco: Never Used  Substance Use Topics  . Alcohol use: No   Marital Status: Divorced  ROS  Review of Systems  Cardiovascular: Positive for claudication (right calf worse than left.) and dyspnea on exertion (chronic). Negative for leg swelling, near-syncope and syncope.  Respiratory: Positive for cough (chronic).   Musculoskeletal: Positive for joint pain (left hip worse than right, h/o arthroplasty).  Gastrointestinal: Negative for melena.  All other systems reviewed and are negative.  Objective   Vitals with BMI 05/28/2020 04/27/2020 10/09/2019  Height 6\' 4"  6\' 4"  6\' 4"   Weight 242 lbs 11 oz 240 lbs 243 lbs  BMI 29.55 [id number]  Systolic [id number]  Diastolic 76 74 67  Pulse 85 97 84    Blood pressure 132/76, pulse 85, height 6\' 4"  (1.93 m), weight 242 lb 11.2 oz (110.1 kg), SpO2 97 %. Body mass index is 29.54 kg/m.   Physical Exam  Constitutional: He appears  well-developed and well-nourished. No distress.  Cardiovascular: Normal rate. An irregularly irregular rhythm present. Exam reveals no gallop.  No murmur heard. Pulses:      Carotid pulses are 2+ on the right side and 2+ on the left side.      Radial pulses are 2+ on the right side and 2+ on the left side.       Femoral pulses are 1+ on the right side and 1+ on the left side.      Popliteal pulses are 0 on the right side and 0 on the left side.       Dorsalis pedis  pulses are 0 on the right side and 0 on the left side.       Posterior tibial pulses are 0 on the right side and 0 on the left side.  S1 variable, S2 normal. No leg edema, no JVD.  Pulmonary/Chest: Effort normal and breath sounds normal.  Abdominal: Soft. Bowel sounds are normal.    Laboratory examination:   Recent Labs    09/04/19 0159 09/04/19 0159 09/05/19 0202 09/06/19 0356 10/09/19 1157  NA 132*   < > 141 140 138  K 3.7   < > 3.6 3.8 4.3  CL 102   < > 107 105 104  CO2 23   < > 23 23 26   GLUCOSE 95   < > 100* 101* 97  BUN 13   < > 12 15 21   CREATININE 1.05   < > 1.03 1.01 1.16  CALCIUM 7.9*   < > 8.8* 8.9 10.1  GFRNONAA >60  --  >60 >60  --   GFRAA >60  --  >60 >60  --    < > = values in this interval not displayed.   CMP Latest Ref Rng & Units 10/09/2019 09/06/2019 09/05/2019  Glucose 70 - 99 mg/dL 97 101(H) 100(H)  BUN 6 - 23 mg/dL 21 15 12   Creatinine 0.40 - 1.50 mg/dL 1.16 1.01 1.03  Sodium 135 - 145 mEq/L 138 140 141  Potassium 3.5 - 5.1 mEq/L 4.3 3.8 3.6  Chloride 96 - 112 mEq/L 104 105 107  CO2 19 - 32 mEq/L 26 23 23   Calcium 8.4 - 10.5 mg/dL 10.1 8.9 8.8(L)  Total Protein 6.0 - 8.3 g/dL 7.3 6.3(L) 5.9(L)  Total Bilirubin 0.2 - 1.2 mg/dL 0.6 1.6(H) 2.7(H)  Alkaline Phos 39 - 117 U/L 81 174(H) 171(H)  AST 0 - 37 U/L 22 67(H) 63(H)  ALT 0 - 53 U/L 34 142(H) 153(H)   CBC Latest Ref Rng & Units 10/09/2019 09/06/2019 09/05/2019  WBC 4.0 - 10.5 K/uL 12.1(H) 11.6(H) 8.9  Hemoglobin 13.0 - 17.0 g/dL 15.3  11.4(L) 11.3(L)  Hematocrit 39.0 - 52.0 % 46.4 35.5(L) 34.8(L)  Platelets 150.0 - 400.0 K/uL 226.0 172 149(L)   Lipid Panel     Component Value Date/Time   CHOL 124 08/06/2019 1024   TRIG 221 (A) 08/06/2019 1024   HDL 26 (A) 08/06/2019 1024   CHOLHDL 7.8 07/17/2009 1844   VLDL 30 07/17/2009 1844   LDLCALC 54 08/06/2019 1024    HEMOGLOBIN A1C Lab Results  Component Value Date   HGBA1C 5.9 09/12/2018   MPG 117 03/08/2017   TSH Recent Labs    09/03/19 1346 10/09/19 1157  TSH 3.066 2.09   Medications and allergies   Allergies  Allergen Reactions  . Crestor [Rosuvastatin] Other (See Comments)    Severe headache  . Pred Forte [Prednisolone Acetate] Other (See Comments)    Severe burning  . Atorvastatin Other (See Comments)    Shaky, dizziness, headaches  . Codeine Other (See Comments)    Shakes     Current Outpatient Medications  Medication Instructions  . acetaminophen (TYLENOL) 500-1,000 mg, Oral, Every 6 hours PRN  . aspirin EC 81 mg, Oral, Daily  . diltiazem (CARDIZEM CD) 120 mg, Oral, Daily with breakfast  . ipratropium-albuterol (DUONEB) 0.5-2.5 (3) MG/3ML SOLN 3 mLs, Nebulization, Every 6 hours PRN  . latanoprost (XALATAN) 0.005 % ophthalmic solution 1 drop, Both Eyes, Daily at 10 pm  . lidocaine (LIDODERM) 5 % 1 patch, Transdermal, Daily PRN,  Remove & Discard patch within 12 hours or as directed by MD  . OXYGEN 2 L, Inhalation, Nightly and as needed  . simvastatin (ZOCOR) 10 mg, Oral, Daily at bedtime  . timolol (TIMOPTIC) 0.5 % ophthalmic solution 1 drop, Both Eyes, Daily  . vitamin B-12 (CYANOCOBALAMIN) 1,000 mcg, Oral, Daily    Radiology:   No results found.  Cardiac Studies:   Echocardiogram 09/01/2019 :  1. The left ventricle has low normal systolic function, with an ejection fraction of 55-60%. The cavity size was normal. There is moderately increased left ventricular wall thickness. Left ventricular diastolic function could not be evaluated  secondary  to atrial fibrillation. Inadequat for regional wall motion assessment. 2. The right ventricle has normal systolic function. The cavity was normal. There is no increase in right ventricular wall thickness. 3. Moderate thickening of the mitral valve leaflet. Mild calcification of the mitral valve leaflet. There is moderate mitral annular calcification present. 4. The aortic valve is tricuspid. Mild sclerosis of the aortic valve. Aortic valve regurgitation was not assessed by color flow Doppler. 5. The aorta is normal unless otherwise noted. 6. The interatrial septum appears to be lipomatous. 7. Trivial pericardial effusion is present. The pericardial effusion is circumferential.  Lexiscan Cardiolite stress 09/04/2019: Moderate area of decreased myocardial activity in the mid and distal inferior, inferolateral and inferoapical wall suggestive of myocardial ischemia, normal LV wall motion, LVEF 50%.  Intermediate risk study.  EKG   EKG 05/28/2020: Atrial fibrillation with controlled ventricular sponsor rate of 84 bpm, normal axis.  Poor R wave progression, probably normal variant.  No evidence of ischemia.  Low-voltage complexes.  Consider pulmonary disease pattern. No significant change from 10/09/2019.  Assessment     ICD-10-CM   1. Preoperative cardiovascular examination  Z01.810 PCV MYOCARDIAL PERFUSION WITH LEXISCAN  2. Permanent atrial fibrillation ([place]). CHA2DS2-VASc Score is 3.  Yearly risk of stroke: 3.2% (A, HTN, Vasc Dz).   I48.21 EKG 12-Lead  3. Dyspnea on exertion  R06.00 PCV MYOCARDIAL PERFUSION WITH LEXISCAN  4. Abnormal nuclear stress test  R94.39 PCV MYOCARDIAL PERFUSION WITH LEXISCAN  5. Peripheral artery disease (HCC)  I73.9 aspirin EC 81 MG tablet    simvastatin (ZOCOR) 10 MG tablet    Recommendations:   [name]  is a 74 y.o. Caucasian male with GERD, history of non-Hodgkin's lymphoma, hyperglycemia, paroxysmal atrial fibrillation, tobacco use disorder  with COPD, obstructive sleep apnea on CPAP, PAD.  He had initially presented with sepsis secondary to cholangitis and biliary duct dilatation, nuclear stress test on 09/04/2019 had revealed mild inferior ischemia.  In view of his multiple risk factors, acute sepsis, cholecystectomy was postponed, he underwent ERCP and stent implantation on 09/02/2019.  He has now been scheduled for retrieval of the stent following which he is to undergo cholecystectomy.  I reviewed his charts, I do believe that he is clinically high risk and has intermediate risk nuclear stress test, in view of ongoing dyspnea, I recommended repeating the stress test and if there is no significant progression of inferior ischemia, low to intermediate risk stress test, he can be taken up for the surgery and if high risk, he will initially need cardiac catheterization. I have had extensive discussion with gastroenterologist Irving Copas, MD.  With regard to permanent atrial fibrillation, he is not on anticoagulation and does not recollect ever being on any anticoagulation although he was discharged in September on Xarelto.  He does not want to be on anticoagulation and he  has discontinued aspirin as well.  He is aware of the risk of stroke, in view of underlying peripheral arterial disease and probably underlying coronary artery disease, I have recommended that he start 81 mg aspirin which will not be a contraindication for his surgery or procedures.  Smoking cessation was again discussed with the patient.  He is not motivated to quit smoking at this time.  Although his lipids are normal, in view of underlying vascular risk, I have started him on Crestor which he did not tolerate due to severe headache, he is willing to try low-dose simvastatin 10 mg daily. I'd like to see him back in 2 months, will obtain lower extremity arterial duplex prior to his office visit.  I will also send surgical risk stratification note to Tobe Sos, MD        Adrian Prows, MD, [facility] 05/29/2020, 4:10 PM [facility] Pager: [phone] Office: [phone]

## 2020-05-27 NOTE — Telephone Encounter (Signed)
Great news! Mr Avellino will see you tomorrow at 10:30am-  A 30 min slot has been saved for you and the patient.   Thank you!  -Leda Quail

## 2020-05-28 ENCOUNTER — Other Ambulatory Visit: Payer: Self-pay

## 2020-05-28 ENCOUNTER — Encounter: Payer: Self-pay | Admitting: Cardiology

## 2020-05-28 ENCOUNTER — Ambulatory Visit: Payer: Medicare Other | Admitting: Cardiology

## 2020-05-28 VITALS — BP 132/76 | HR 85 | Ht 76.0 in | Wt 242.7 lb

## 2020-05-28 DIAGNOSIS — Z01810 Encounter for preprocedural cardiovascular examination: Secondary | ICD-10-CM | POA: Diagnosis not present

## 2020-05-28 DIAGNOSIS — R9439 Abnormal result of other cardiovascular function study: Secondary | ICD-10-CM | POA: Diagnosis not present

## 2020-05-28 DIAGNOSIS — I739 Peripheral vascular disease, unspecified: Secondary | ICD-10-CM

## 2020-05-28 DIAGNOSIS — R0609 Other forms of dyspnea: Secondary | ICD-10-CM | POA: Diagnosis not present

## 2020-05-28 DIAGNOSIS — I4821 Permanent atrial fibrillation: Secondary | ICD-10-CM | POA: Diagnosis not present

## 2020-05-29 ENCOUNTER — Encounter: Payer: Self-pay | Admitting: Cardiology

## 2020-05-29 MED ORDER — ASPIRIN EC 81 MG PO TBEC: 81.0000 mg | DELAYED_RELEASE_TABLET | Freq: Every day | ORAL | 3 refills | Status: DC

## 2020-05-29 MED ORDER — SIMVASTATIN 10 MG PO TABS: 10.0000 mg | ORAL_TABLET | Freq: Every day | ORAL | 2 refills | Status: DC

## 2020-05-31 ENCOUNTER — Ambulatory Visit: Payer: Medicare Other

## 2020-05-31 ENCOUNTER — Other Ambulatory Visit: Payer: Self-pay

## 2020-05-31 ENCOUNTER — Telehealth: Payer: Self-pay | Admitting: Family Medicine

## 2020-05-31 DIAGNOSIS — R9439 Abnormal result of other cardiovascular function study: Secondary | ICD-10-CM

## 2020-05-31 DIAGNOSIS — R0609 Other forms of dyspnea: Secondary | ICD-10-CM | POA: Diagnosis not present

## 2020-05-31 DIAGNOSIS — Z01810 Encounter for preprocedural cardiovascular examination: Secondary | ICD-10-CM | POA: Diagnosis not present

## 2020-05-31 NOTE — Telephone Encounter (Signed)
Patient assigned to [facility] provider by GI and cardiology team without informing [facility] administration or Dr. Gwendlyn Deutscher. I called to discuss with the patient. He said he does not know who Hazel Leveille is and has never met him/her. However, he has the name listed on his insurance and was told by his specialist that that is his PCP. I informed him that I am Dr. Gwendlyn Deutscher, and I discuss our new patient enrollment process with him. He is aware that he is not our patient technically, and he is not on our patient panelist. He is advised to contact our front office for enrollment or choose a different office or provider in the area should he prefer to do so. He agreed with the plan.

## 2020-06-02 ENCOUNTER — Encounter: Payer: Self-pay | Admitting: Cardiology

## 2020-06-02 ENCOUNTER — Telehealth: Payer: Self-pay | Admitting: Cardiology

## 2020-06-02 ENCOUNTER — Telehealth: Payer: Self-pay

## 2020-06-02 NOTE — Progress Notes (Signed)
Please read

## 2020-06-02 NOTE — Progress Notes (Signed)
Patient aware.

## 2020-06-02 NOTE — Telephone Encounter (Signed)
Dr. Einar Gip, Thanks so much for the quick follow-up with the patient. I have forwarded the results anesthesia and they will let me know to make sure there is no contraindication from their standpoint any further before scheduling ERCP. I hope to hear from them before the end of the week. We will get him scheduled as soon as able based on my scheduling availability. Appreciate the help as always. GM

## 2020-06-09 ENCOUNTER — Other Ambulatory Visit: Payer: Self-pay

## 2020-06-09 ENCOUNTER — Telehealth: Payer: Self-pay | Admitting: Gastroenterology

## 2020-06-09 DIAGNOSIS — Z9889 Other specified postprocedural states: Secondary | ICD-10-CM

## 2020-06-09 DIAGNOSIS — K8050 Calculus of bile duct without cholangitis or cholecystitis without obstruction: Secondary | ICD-10-CM

## 2020-06-09 DIAGNOSIS — K8020 Calculus of gallbladder without cholecystitis without obstruction: Secondary | ICD-10-CM

## 2020-06-09 NOTE — Telephone Encounter (Signed)
ERCP scheduled for 07/21/20 [facility] Dr Rush Landmark COVID test 7/24

## 2020-06-09 NOTE — Telephone Encounter (Signed)
Patty, I did have an opportunity to discuss case with Dr. Einar Gip as well as our Anesthesia team. They have no issues to move forward with rescheduling his ERCP. I believe I had sent this message to [place] or yourself, but I have gone back, and it looks like the message had not been forwarded to either of you. My apologies. Move forward with scheduling his ERCP as able in July if possible, hopefully since I do not think that we will have any time in June left. Thank you. GM

## 2020-06-09 NOTE — Telephone Encounter (Signed)
The pt would like to know if you have heard from Dr Einar Gip and what the next steps are?

## 2020-06-10 NOTE — Progress Notes (Addendum)
Anesthesia Chart Review:  Case: [id number] Date/Time: 07/21/20 1000   Procedure: ENDOSCOPIC RETROGRADE CHOLANGIOPANCREATOGRAPHY (ERCP) WITH PROPOFOL (N/A )   Anesthesia type: General   Pre-op diagnosis: stent removal   [facility]   Surgeons: Mansouraty, Telford Nab., MD      DISCUSSION: Patient will be a 74 year old male by his 07/21/2020 procedure listed above. Procedure was initially scheduled at [facility] on 05/26/2020, but delayed until he was re-evaluated by cardiology. Since then he had a follow-up stress test on 05/31/2020 (due to inferior ischemia on 09/04/2019 stress test) that showed normal myocardial perfusion, EF 34% (possibly due to calculation error from afib; 55-60% 09/01/2019 by echo), so cardiologist Dr. Einar Gip wrote, "Clinically low risk. NO clinical e/o CHF. Prior Echo 6 months ago, normal EF. Okay to proceed with ERCP/Stent retrieval and lap chole.Marland KitchenMarland KitchenI have cleared him for surgery."   History includes postoperative N/V, smoking, COPD (oxygen 2L as needed), afib (diagnosed 2012), DM2, OSA (CPAP), GERD, glaucoma, hiatal hernia, Non-Hodgkin's lymphoma (diagnosed 08/20/2009 via splenic biopsy and pancreatic tall mass biopsy; s/p  7 cycles R-CHOP chemotherapy), orthostatic hypotension, left THA (12/13/2012).  For anesthesia history, it was documented on 12/09/2012 that he reported "hard to wake up, 'felt like died and revived me'". He has since undergone left THA 12/13/2012 and open left inguinal hernia repair 03/09/2017.  [name] was admitted in September 2020 with acute cholecystitis. S/p ERCP 09/02/2019 with sphincterotomy, balloon dilation, stone extraction, stent placement with drainage of bile/pus. Was being evaluated for cholecystectomy. Patient with known afib history, but not on anticoagulation therapy at that time. Preoperative cardiac work-up included an echo and Lexiscan Myoview stress test which was intermediate risk (LVEF 50-60%; moderate reversible myocardial  perfusion defect in the inferior, inferolateral, and inferoapical walls of the left ventricle,consistent with inducible myocardial ischemia). At that time Dr. Einar Gip cleared him to proceed given his acute gallbladder issues, but at moderate risk for cardiac morbidity and mortality given likely underlying CAD (in view of stress test findings) and CV risk factors. However, surgery delayed for further recovery of infection of cholangitis, and then after patient's symptoms improved, he did not schedule follow-up with general surgery or GI.   In Spring 2021, GI contacted patient about removing or replacing his plastic biliary stent since it had been in for > 6 months and wanted to avoid it becoming occluded. He was also referred back to Dr. Marcello Moores with [facility] (general surgery).  As above, given initial cardiac clearance was from 09/2019 and under more acute circumstances, the anesthesiologist wanted [name] to have another cardiology evaluation to see if any additional cardiac testing was warranted prior to undergoing endoscopy procedure and possibly future cholecystectomy. After multiple conversations with Dr. Einar Gip and Dr. Rush Landmark, [name] agreed to see Dr. Einar Gip on 05/28/2020.  Patient with chronic dyspnea and bilateral leg claudication, but without chest pain.  A. fib remained rate controlled.  Clinically he was felt to be high risk, and with previous intermediate risk stress test and ongoing dyspnea, a repeat stress test was ordered (see results below), and he was cleared to proceed with planned procedure/procedures. In regards to permanent atrial fibrillation.  [name] was not on anticoagulation, and did not want to be on anticoagulation.  Dr. Einar Gip wrote, "He is aware of the risk of stroke, in view of underlying peripheral arterial disease and probably underlying coronary artery disease, I have recommended that he start 81 mg aspirin which will not be a contraindication  for his surgery or procedures." (Dr.  Rush Landmark is okay with maintaining him on ASA throughout for this procedure.)  He was not yet motivated to quit smoking. (Cardiology clearance previously reviewed with anesthesiologist Suella Broad, MD. I also discussed that case was scheduled at [place] with anesthesiologist Renold Don, MD.)    Preprocedure COVID-19 test is currently scheduled for 07/17/2020. Anesthesia team to evaluate on the day of procedure.    VS:   Wt Readings from Last 3 Encounters:  05/28/20 110.1 kg  04/27/20 108.9 kg  10/09/19 110.2 kg   BP Readings from Last 3 Encounters:  05/28/20 132/76  04/27/20 120/74  10/09/19 124/67   Pulse Readings from Last 3 Encounters:  05/28/20 85  04/27/20 97  10/09/19 84    PROVIDERS: No primary care provider on file. (was Lennox Solders, MD, recently retired; 11/05/2019 office note scanned under Media tab) Adrian Prows, MD is cardiologist [facility]) Leighton Ruff, MD is general surgeon   LABS: Labs per GI/Anesthesia. As of 10/09/2019, Cr 1.16, H/H 15.3/46.4, PLT 226, glucose 97.    IMAGES: 1V PCXR 09/06/2019: FINDINGS: Lung volumes are normal. Mild diffuse peribronchial cuffing. No consolidative airspace disease. No pleural effusions. No pneumothorax. No pulmonary nodule or mass noted. Pulmonary vasculature and the cardiomediastinal silhouette are within normal limits. IMPRESSION: 1. Mild diffuse peribronchial cuffing which may suggest an acute bronchitis.   EKG: EKG 05/28/2020 [facility]): Atrial fibrillation with controlled ventricular sponsor rate of 84 bpm, normal axis.  Poor R wave progression, probably normal variant.  No evidence of ischemia.  Low-voltage complexes.  Consider pulmonary disease pattern. ABNORMAL RHYTHM - Afib also noted on 09/06/2019 and 10/09/2019 EKGs   CV: Lexiscan (Walking with mod Bruce)Tetrofosmin Stress Test  06/01/2020: Nondiagnostic ECG stress. Underlying A. Fibrillation and occasional PVC.   Myocardial perfusion is normal. Stress LV EF: 34% with global hypokinesis. The calculated LVEF may be an error due to difficulty in gating from A. Fibrillation. Compared to the study done on 09/04/2019, inferior ischemia not present and EF 50%. This is a low to intermediate risk study.  (Comparison NST 09/04/2019: moderate reversible myocardial perfusion defect in the inferior, inferolateral, and inferoapical wall of the LV, consistent with ischemic, LVEF 50%, intermediate risk)   Echo 09/01/2019: - Sonographer Comments: Patient is morbidly obese, suboptimal apical window,  suboptimal parasternal window and Technically difficult study due to poor  echo windows. Patient could not lean back due to nausea and pain.  IMPRESSIONS  1. The left ventricle has low normal systolic function, with an ejection  fraction of 55-60%. The cavity size was normal. There is moderately  increased left ventricular wall thickness. Left ventricular diastolic  function could not be evaluated secondary  to atrial fibrillation. Inadequat for regional wall motion assessment.  2. The right ventricle has normal systolic function. The cavity was  normal. There is no increase in right ventricular wall thickness.  3. Moderate thickening of the mitral valve leaflet. Mild calcification of  the mitral valve leaflet. There is moderate mitral annular calcification  present.  4. The aortic valve is tricuspid. Mild sclerosis of the aortic valve.  Aortic valve regurgitation was not assessed by color flow Doppler.  5. The aorta is normal unless otherwise noted.  6. The interatrial septum appears to be lipomatous.  7. Trivial pericardial effusion is present.  8. The pericardial effusion is circumferential.    Carotid US 04/24/2006: IMPRESSION: 1. No hemodynamically significant ICA stenosis.  2. Patent vertebral arteries bilaterally.  3.  No significant carotid atherosclerosis by ultrasound.   Past Medical  History:  Diagnosis Date  . Atrial fibrillation ([place])   . Avascular necrosis of bones of both hips ([place])   . 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 Cancer ([place])   . Choledocholithiasis    HX  . Colonic polyp   . Complication of anesthesia    hard to wake up, "felt like died and revived me"  . COPD (chronic obstructive pulmonary disease) ([facility])    mild PFT abnormalities; normal ABG  . Degenerative disc disease, lumbar    HNP of the LS spine  . Diabetes mellitus    type 2  . Full dentures   . GERD (gastroesophageal reflux disease)   . Glaucoma   . H/O hiatal hernia   . Headache   . Hx MRSA infection 2010   sith splenic abcess  . Non Hodgkin's lymphoma ([place]) 06/2009   chemotherapy until 11/2010  . Obesity   . Obstructive sleep apnea    treated with CPAP and oxygen  . On supplemental oxygen therapy    wears at HS and PRN  . Orthostatic hypotension    lightheadness; no definate loss of consciousness  . Pedal edema   . Permanent atrial fibrillation ([place]) 01/10/2011   Low thromboembolic risk   . Pneumonia   . PONV (postoperative nausea and vomiting)   . Splenic abscess 07/2009  . Tobacco abuse    45 pack years    Past Surgical History:  Procedure Laterality Date  . ABSCESS DRAINAGE  2010   Splenic  . ANAL FISSURE 46  74years old  . BILIARY DILATION  09/02/2019   Procedure: BILIARY DILATION;  Surgeon: Rush Landmark Telford Nab., MD;  Location: [facility];  Service: Gastroenterology;;  . BILIARY STENT PLACEMENT N/A 09/02/2019   Procedure: BILIARY STENT PLACEMENT;  Surgeon: Irving Copas., MD;  Location: [facility];  Service: Gastroenterology;  Laterality: N/A;  . BIOPSY  09/02/2019   Procedure: BIOPSY;  Surgeon: Rush Landmark Telford Nab., MD;  [facility];  Service: Gastroenterology;;  . CATARACT EXTRACTION     Right with lens implant  . COLONOSCOPY W/ POLYPECTOMY  2008   with snare polypectomy  . ERCP N/A 09/02/2019   Procedure: ENDOSCOPIC RETROGRADE  CHOLANGIOPANCREATOGRAPHY (ERCP);  Surgeon: Irving Copas., MD;  Location: [facility];  Service: Gastroenterology;  Laterality: N/A;  . EYE SURGERY     lens implant  . INGUINAL HERNIA REPAIR Left 03/09/2017   Procedure: OPEN REPAIR LEFT INGUINAL HERNIA;  Surgeon: Arta Bruce Kinsinger, MD;  [facility];  Service: General;  Laterality: Left;  . INSERTION OF MESH Left 03/09/2017   Procedure: INSERTION OF MESH;  Surgeon: Arta Bruce Kinsinger, MD;  [facility];  Service: General;  Laterality: Left;  . KNEE ARTHROSCOPY     right  . LYMPH NODE BIOPSY  2010   done x 2 for NHL diagnosis  . MULTIPLE TOOTH EXTRACTIONS    . REMOVAL OF STONES  09/02/2019   Procedure: REMOVAL OF STONES;  Surgeon: Rush Landmark Telford Nab., MD;  Location: [facility];  Service: Gastroenterology;;  . [name]  09/02/2019   Procedure: [name];  Surgeon: Irving Copas., MD;  [facility];  Service: Gastroenterology;;  . TONSILLECTOMY    . TOTAL HIP ARTHROPLASTY  12/13/2012   Procedure: TOTAL HIP ARTHROPLASTY ANTERIOR APPROACH;  Surgeon: Mcarthur Rossetti, MD;  [facility];  Service: Orthopedics;  Laterality: Left;  Left Total Hip Arthroplasty, Anterior  Approach (C-Arm)    MEDICATIONS: No current facility-administered medications for this encounter.   Marland Kitchen acetaminophen (TYLENOL) 500 MG tablet  . aspirin EC 81 MG tablet  . diltiazem (CARDIZEM CD) 120 MG 24 hr capsule  . ipratropium-albuterol (DUONEB) 0.5-2.5 (3) MG/3ML SOLN  . latanoprost (XALATAN) 0.005 % ophthalmic solution  . lidocaine (LIDODERM) 5 %  . OXYGEN  . simvastatin (ZOCOR) 10 MG tablet  . timolol (TIMOPTIC) 0.5 % ophthalmic solution  . vitamin B-12 (CYANOCOBALAMIN) 1000 MCG tablet   . heparin lock flush 100 unit/mL  . sodium chloride 0.9 % injection 10 mL    Myra Gianotti, PA-C Surgical Short Stay/Anesthesiology [facility] Phone [phone] [facility] Phone [phone] 06/11/2020 2:36 PM

## 2020-06-11 NOTE — Anesthesia Preprocedure Evaluation (Addendum)
Anesthesia Evaluation  Patient identified by MRN, date of birth, ID band Patient awake    Reviewed: Allergy & Precautions, NPO status , Patient's Chart, lab work & pertinent test results  History of Anesthesia Complications (+) PONV and history of anesthetic complications  Airway Mallampati: III  TM Distance: >3 FB Neck ROM: Full    Dental  (+) Edentulous Upper, Edentulous Lower   Pulmonary sleep apnea, Continuous Positive Airway Pressure Ventilation and Oxygen sleep apnea , COPD,  COPD inhaler and oxygen dependent, Current SmokerPatient did not abstain from smoking.,    Pulmonary exam normal breath sounds clear to auscultation       Cardiovascular +CHF  + dysrhythmias Atrial Fibrillation  Rhythm:Irregular Rate:Normal  ECG: a-fib, rate 84  Myocardial perfusion is normal. Stress LV EF: 34% with global hypokinesis. The calculated LVEF may be an error due to difficulty in gating from A. Fibrillation. Compared to the study done on 09/04/2019, inferior ischemia not present and EF 50%. This is a low to intermediate risk study.   Neuro/Psych  Headaches, negative psych ROS   GI/Hepatic Neg liver ROS, hiatal hernia, GERD  Controlled,  Endo/Other  diabetesNon Hodgkin's lymphoma  Renal/GU negative Renal ROS     Musculoskeletal negative musculoskeletal ROS (+)   Abdominal   Peds  Hematology HLD   Anesthesia Other Findings stent removal  Reproductive/Obstetrics                            Anesthesia Physical Anesthesia Plan  ASA: IV  Anesthesia Plan: General   Post-op Pain Management:    Induction: Intravenous  PONV Risk Score and Plan: 3 and Ondansetron, Dexamethasone, Midazolam and Treatment may vary due to age or medical condition  Airway Management Planned: Oral ETT  Additional Equipment:   Intra-op Plan:   Post-operative Plan: Extubation in OR  Informed Consent: I have reviewed the  patients History and Physical, chart, labs and discussed the procedure including the risks, benefits and alternatives for the proposed anesthesia with the patient or authorized representative who has indicated his/her understanding and acceptance.     Dental advisory given  Plan Discussed with: CRNA  Anesthesia Plan Comments: (Reviewed PAT note written 06/11/2020 by Myra Gianotti, PA-C. )      Anesthesia Quick Evaluation

## 2020-06-21 IMAGING — DX DG CHEST 1V PORT
2 series · 2 of 2 positions shown · non-contrast
Comparison: Chest x-ray 08/31/2019.

CLINICAL DATA: 73-year-old male with history of shortness of
breath.

EXAM:
PORTABLE CHEST 1 VIEW

[chest ap (1 of 2)]
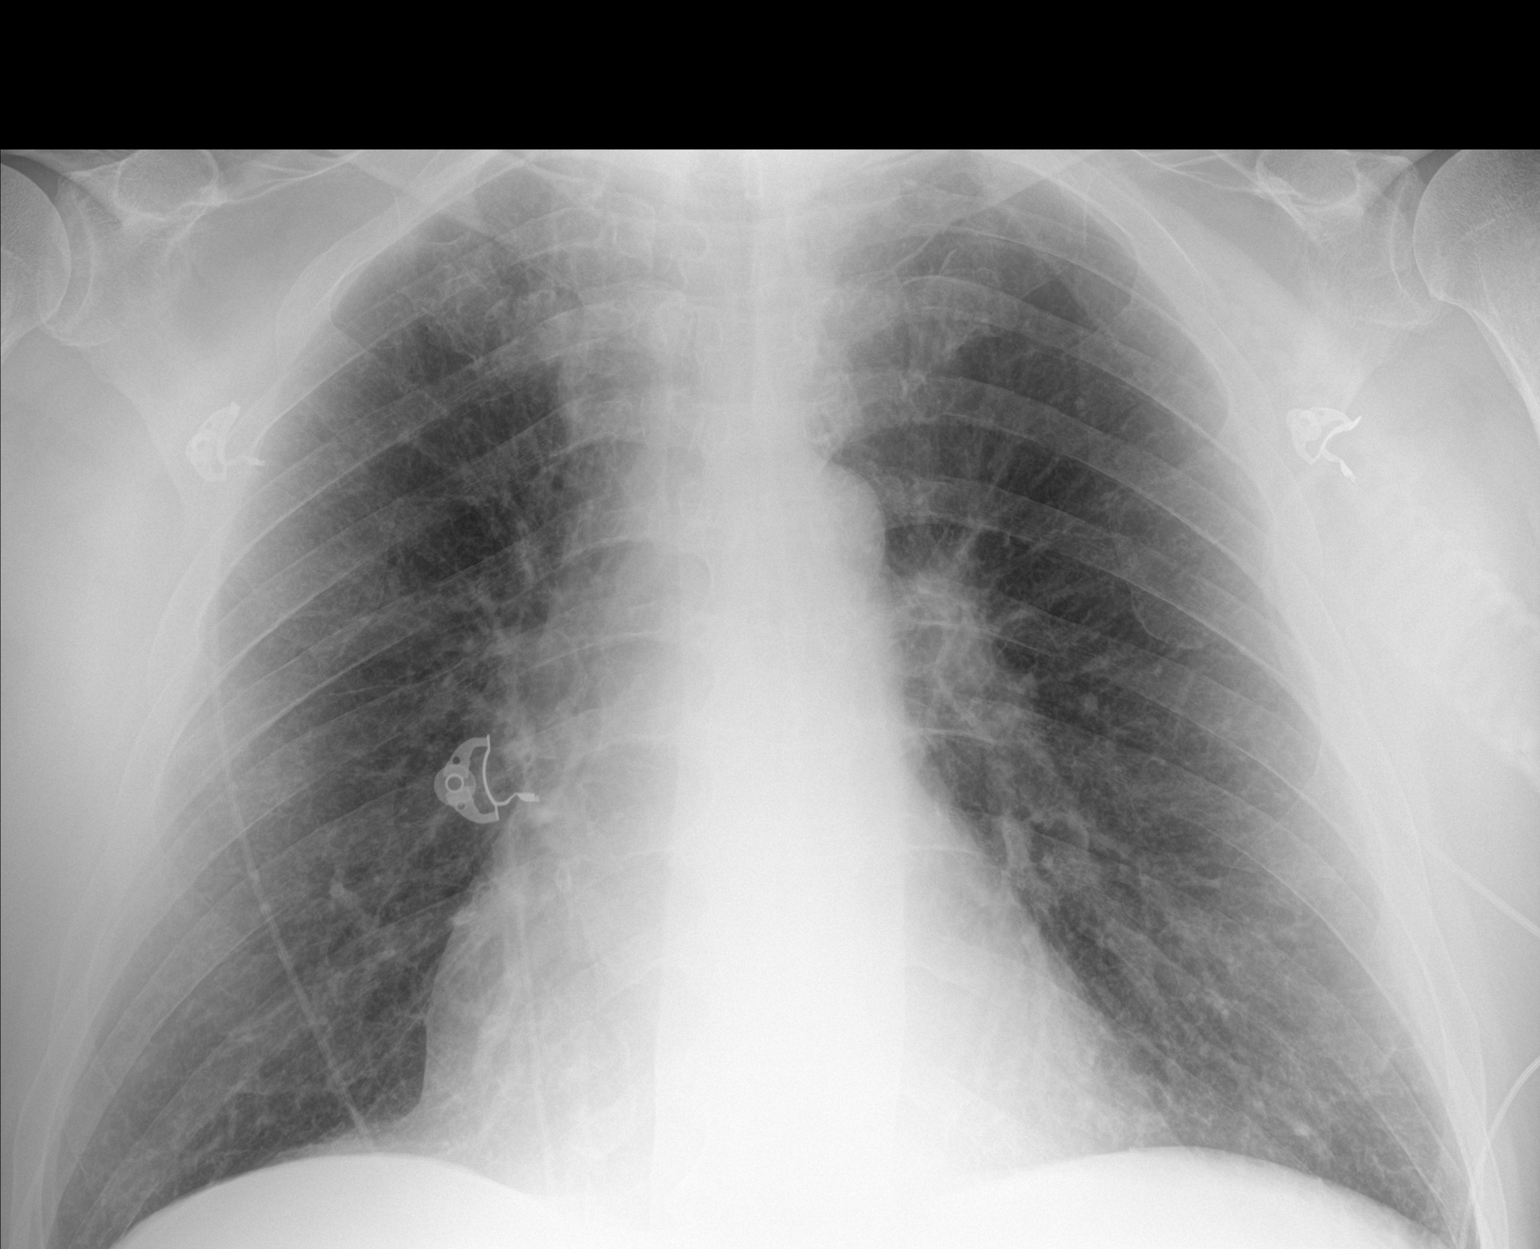

[chest ap (2 of 2)]
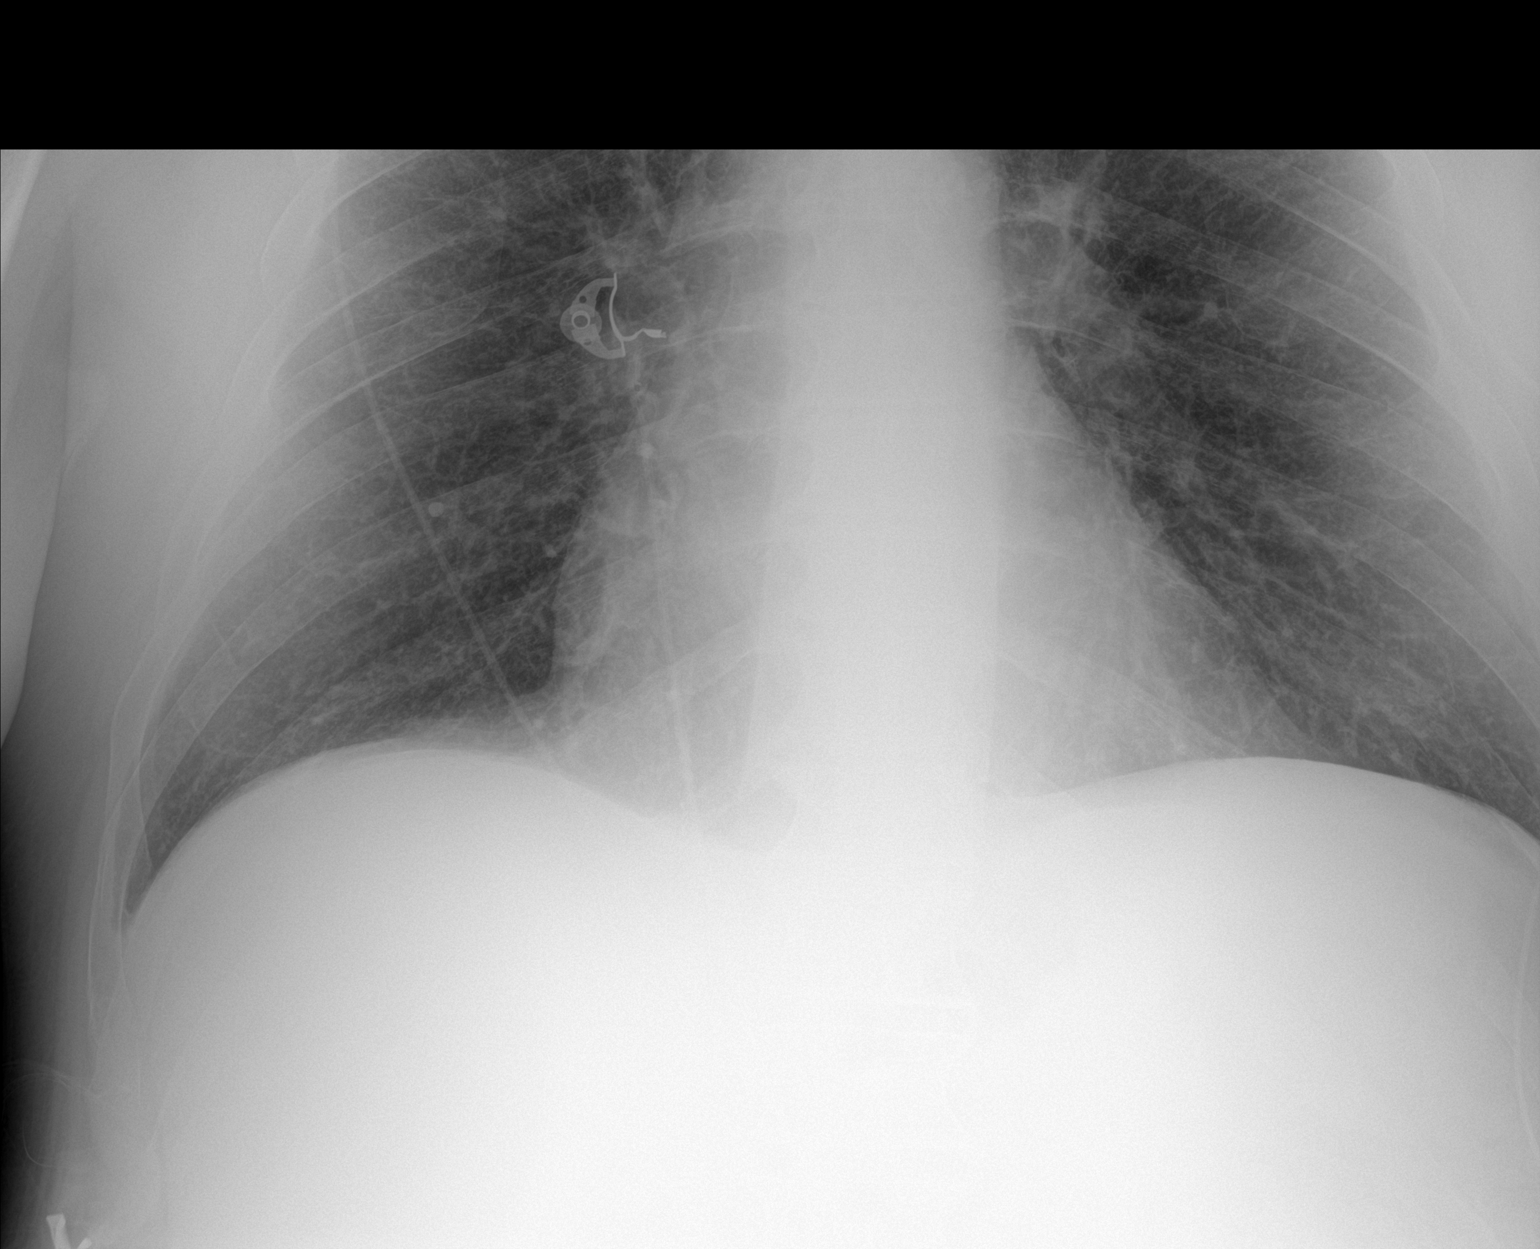

[2 of 2 positions shown; findings below may reference images not displayed]

FINDINGS: Lung volumes are normal. Mild diffuse peribronchial cuffing. No
consolidative airspace disease. No pleural effusions. No
pneumothorax. No pulmonary nodule or mass noted. Pulmonary
vasculature and the cardiomediastinal silhouette are within normal
limits.
IMPRESSION: 1. Mild diffuse peribronchial cuffing which may suggest an acute
bronchitis.

## 2020-07-17 ENCOUNTER — Other Ambulatory Visit (HOSPITAL_COMMUNITY)
Admission: RE | Admit: 2020-07-17 | Discharge: 2020-07-17 | Disposition: A | Discharge status: Home / Self Care | Payer: Medicare Other | Source: Ambulatory Visit | Attending: Gastroenterology | Admitting: Gastroenterology

## 2020-07-17 DIAGNOSIS — Z01812 Encounter for preprocedural laboratory examination: Secondary | ICD-10-CM | POA: Diagnosis not present

## 2020-07-17 DIAGNOSIS — Z20822 Contact with and (suspected) exposure to covid-19: Secondary | ICD-10-CM | POA: Insufficient documentation

## 2020-07-17 LAB — SARS CORONAVIRUS 2 (TAT 6-24 HRS): SARS Coronavirus 2: NEGATIVE

## 2020-07-21 ENCOUNTER — Ambulatory Visit (HOSPITAL_COMMUNITY)
Admission: RE | Admit: 2020-07-21 | Discharge: 2020-07-21 | Disposition: A | Discharge status: Home / Self Care | Payer: Medicare Other | Attending: Gastroenterology | Admitting: Gastroenterology

## 2020-07-21 ENCOUNTER — Ambulatory Visit (HOSPITAL_COMMUNITY): Payer: Medicare Other | Admitting: Vascular Surgery

## 2020-07-21 ENCOUNTER — Telehealth: Payer: Self-pay

## 2020-07-21 ENCOUNTER — Other Ambulatory Visit: Payer: Self-pay

## 2020-07-21 ENCOUNTER — Encounter (HOSPITAL_COMMUNITY): Payer: Self-pay | Admitting: Gastroenterology

## 2020-07-21 ENCOUNTER — Ambulatory Visit (HOSPITAL_COMMUNITY): Payer: Medicare Other

## 2020-07-21 ENCOUNTER — Encounter (HOSPITAL_COMMUNITY)
Admission: RE | Disposition: A | Discharge status: Home / Self Care | Payer: Self-pay | Source: Home / Self Care | Attending: Gastroenterology

## 2020-07-21 ENCOUNTER — Telehealth: Payer: Self-pay | Admitting: Gastroenterology

## 2020-07-21 DIAGNOSIS — Z4689 Encounter for fitting and adjustment of other specified devices: Secondary | ICD-10-CM

## 2020-07-21 DIAGNOSIS — Z888 Allergy status to other drugs, medicaments and biological substances status: Secondary | ICD-10-CM | POA: Insufficient documentation

## 2020-07-21 DIAGNOSIS — E785 Hyperlipidemia, unspecified: Secondary | ICD-10-CM | POA: Insufficient documentation

## 2020-07-21 DIAGNOSIS — R519 Headache, unspecified: Secondary | ICD-10-CM | POA: Diagnosis not present

## 2020-07-21 DIAGNOSIS — J449 Chronic obstructive pulmonary disease, unspecified: Secondary | ICD-10-CM | POA: Diagnosis not present

## 2020-07-21 DIAGNOSIS — Z9221 Personal history of antineoplastic chemotherapy: Secondary | ICD-10-CM | POA: Diagnosis not present

## 2020-07-21 DIAGNOSIS — Z806 Family history of leukemia: Secondary | ICD-10-CM | POA: Insufficient documentation

## 2020-07-21 DIAGNOSIS — G4733 Obstructive sleep apnea (adult) (pediatric): Secondary | ICD-10-CM | POA: Insufficient documentation

## 2020-07-21 DIAGNOSIS — F17210 Nicotine dependence, cigarettes, uncomplicated: Secondary | ICD-10-CM | POA: Insufficient documentation

## 2020-07-21 DIAGNOSIS — I509 Heart failure, unspecified: Secondary | ICD-10-CM | POA: Insufficient documentation

## 2020-07-21 DIAGNOSIS — Z8614 Personal history of Methicillin resistant Staphylococcus aureus infection: Secondary | ICD-10-CM | POA: Diagnosis not present

## 2020-07-21 DIAGNOSIS — K8020 Calculus of gallbladder without cholecystitis without obstruction: Secondary | ICD-10-CM

## 2020-07-21 DIAGNOSIS — G4730 Sleep apnea, unspecified: Secondary | ICD-10-CM | POA: Insufficient documentation

## 2020-07-21 DIAGNOSIS — K449 Diaphragmatic hernia without obstruction or gangrene: Secondary | ICD-10-CM | POA: Diagnosis not present

## 2020-07-21 DIAGNOSIS — K8050 Calculus of bile duct without cholangitis or cholecystitis without obstruction: Secondary | ICD-10-CM

## 2020-07-21 DIAGNOSIS — Z4659 Encounter for fitting and adjustment of other gastrointestinal appliance and device: Secondary | ICD-10-CM | POA: Diagnosis not present

## 2020-07-21 DIAGNOSIS — Z7982 Long term (current) use of aspirin: Secondary | ICD-10-CM | POA: Insufficient documentation

## 2020-07-21 DIAGNOSIS — Z800 Family history of malignant neoplasm of digestive organs: Secondary | ICD-10-CM | POA: Insufficient documentation

## 2020-07-21 DIAGNOSIS — T85590A Other mechanical complication of bile duct prosthesis, initial encounter: Secondary | ICD-10-CM | POA: Diagnosis not present

## 2020-07-21 DIAGNOSIS — K8031 Calculus of bile duct with cholangitis, unspecified, with obstruction: Secondary | ICD-10-CM | POA: Diagnosis not present

## 2020-07-21 DIAGNOSIS — M5136 Other intervertebral disc degeneration, lumbar region: Secondary | ICD-10-CM | POA: Diagnosis not present

## 2020-07-21 DIAGNOSIS — K3189 Other diseases of stomach and duodenum: Secondary | ICD-10-CM | POA: Diagnosis not present

## 2020-07-21 DIAGNOSIS — M879 Osteonecrosis, unspecified: Secondary | ICD-10-CM | POA: Insufficient documentation

## 2020-07-21 DIAGNOSIS — K838 Other specified diseases of biliary tract: Secondary | ICD-10-CM | POA: Diagnosis not present

## 2020-07-21 DIAGNOSIS — Z79899 Other long term (current) drug therapy: Secondary | ICD-10-CM | POA: Insufficient documentation

## 2020-07-21 DIAGNOSIS — Z8249 Family history of ischemic heart disease and other diseases of the circulatory system: Secondary | ICD-10-CM | POA: Insufficient documentation

## 2020-07-21 DIAGNOSIS — C8190 Hodgkin lymphoma, unspecified, unspecified site: Secondary | ICD-10-CM | POA: Insufficient documentation

## 2020-07-21 DIAGNOSIS — H42 Glaucoma in diseases classified elsewhere: Secondary | ICD-10-CM | POA: Diagnosis not present

## 2020-07-21 DIAGNOSIS — Z9981 Dependence on supplemental oxygen: Secondary | ICD-10-CM | POA: Insufficient documentation

## 2020-07-21 DIAGNOSIS — Z9841 Cataract extraction status, right eye: Secondary | ICD-10-CM | POA: Insufficient documentation

## 2020-07-21 DIAGNOSIS — K219 Gastro-esophageal reflux disease without esophagitis: Secondary | ICD-10-CM | POA: Insufficient documentation

## 2020-07-21 DIAGNOSIS — Z8572 Personal history of non-Hodgkin lymphomas: Secondary | ICD-10-CM | POA: Insufficient documentation

## 2020-07-21 DIAGNOSIS — D045 Carcinoma in situ of skin of trunk: Secondary | ICD-10-CM | POA: Diagnosis not present

## 2020-07-21 DIAGNOSIS — E1139 Type 2 diabetes mellitus with other diabetic ophthalmic complication: Secondary | ICD-10-CM | POA: Diagnosis not present

## 2020-07-21 DIAGNOSIS — K8030 Calculus of bile duct with cholangitis, unspecified, without obstruction: Secondary | ICD-10-CM | POA: Diagnosis not present

## 2020-07-21 DIAGNOSIS — I951 Orthostatic hypotension: Secondary | ICD-10-CM | POA: Insufficient documentation

## 2020-07-21 DIAGNOSIS — Z961 Presence of intraocular lens: Secondary | ICD-10-CM | POA: Insufficient documentation

## 2020-07-21 DIAGNOSIS — E669 Obesity, unspecified: Secondary | ICD-10-CM | POA: Insufficient documentation

## 2020-07-21 DIAGNOSIS — F17200 Nicotine dependence, unspecified, uncomplicated: Secondary | ICD-10-CM | POA: Insufficient documentation

## 2020-07-21 DIAGNOSIS — Z86010 Personal history of colonic polyps: Secondary | ICD-10-CM | POA: Insufficient documentation

## 2020-07-21 DIAGNOSIS — Z9889 Other specified postprocedural states: Secondary | ICD-10-CM

## 2020-07-21 DIAGNOSIS — Z885 Allergy status to narcotic agent status: Secondary | ICD-10-CM | POA: Insufficient documentation

## 2020-07-21 DIAGNOSIS — Z96642 Presence of left artificial hip joint: Secondary | ICD-10-CM | POA: Insufficient documentation

## 2020-07-21 DIAGNOSIS — Z6828 Body mass index (BMI) 28.0-28.9, adult: Secondary | ICD-10-CM | POA: Insufficient documentation

## 2020-07-21 DIAGNOSIS — I4821 Permanent atrial fibrillation: Secondary | ICD-10-CM | POA: Diagnosis not present

## 2020-07-21 DIAGNOSIS — I313 Pericardial effusion (noninflammatory): Secondary | ICD-10-CM | POA: Insufficient documentation

## 2020-07-21 DIAGNOSIS — Z96641 Presence of right artificial hip joint: Secondary | ICD-10-CM | POA: Insufficient documentation

## 2020-07-21 DIAGNOSIS — I351 Nonrheumatic aortic (valve) insufficiency: Secondary | ICD-10-CM | POA: Insufficient documentation

## 2020-07-21 HISTORY — PX: ENDOSCOPIC RETROGRADE CHOLANGIOPANCREATOGRAPHY (ERCP) WITH PROPOFOL: SHX5810

## 2020-07-21 HISTORY — PX: SPHINCTEROTOMY: SHX5544

## 2020-07-21 HISTORY — PX: STENT REMOVAL: SHX6421

## 2020-07-21 HISTORY — PX: REMOVAL OF STONES: SHX5545

## 2020-07-21 HISTORY — PX: BILIARY STENT PLACEMENT: SHX5538

## 2020-07-21 LAB — GLUCOSE, CAPILLARY: Glucose-Capillary: 95 mg/dL (ref 70–99)

## 2020-07-21 SURGERY — ENDOSCOPIC RETROGRADE CHOLANGIOPANCREATOGRAPHY (ERCP) WITH PROPOFOL
Anesthesia: General

## 2020-07-21 MED ORDER — SODIUM CHLORIDE 0.9 % IV SOLN: INTRAVENOUS | Status: DC

## 2020-07-21 MED ORDER — INDOMETHACIN 50 MG RE SUPP
RECTAL | Status: DC | PRN
  Administered 2020-07-21: 100 mg via RECTAL

## 2020-07-21 MED ORDER — ONDANSETRON HCL 4 MG/2ML IJ SOLN
INTRAMUSCULAR | Status: DC | PRN
  Administered 2020-07-21: 4 mg via INTRAVENOUS

## 2020-07-21 MED ORDER — ROCURONIUM BROMIDE 10 MG/ML (PF) SYRINGE
PREFILLED_SYRINGE | INTRAVENOUS | Status: DC | PRN
  Administered 2020-07-21: 50 mg via INTRAVENOUS

## 2020-07-21 MED ORDER — PROPOFOL 10 MG/ML IV BOLUS
INTRAVENOUS | Status: DC | PRN
  Administered 2020-07-21: 200 mg via INTRAVENOUS

## 2020-07-21 MED ORDER — GLUCAGON HCL RDNA (DIAGNOSTIC) 1 MG IJ SOLR
INTRAMUSCULAR | Status: AC
  Filled 2020-07-21: qty 1

## 2020-07-21 MED ORDER — FENTANYL CITRATE (PF) 100 MCG/2ML IJ SOLN
INTRAMUSCULAR | Status: AC
  Filled 2020-07-21: qty 2

## 2020-07-21 MED ORDER — FENTANYL CITRATE (PF) 100 MCG/2ML IJ SOLN
INTRAMUSCULAR | Status: DC | PRN
  Administered 2020-07-21 (×2): 50 ug via INTRAVENOUS

## 2020-07-21 MED ORDER — SUGAMMADEX SODIUM 200 MG/2ML IV SOLN
INTRAVENOUS | Status: DC | PRN
  Administered 2020-07-21: 200 mg via INTRAVENOUS

## 2020-07-21 MED ORDER — INDOMETHACIN 50 MG RE SUPP
RECTAL | Status: AC
  Filled 2020-07-21: qty 1

## 2020-07-21 MED ORDER — LACTATED RINGERS IV SOLN: INTRAVENOUS | Status: DC

## 2020-07-21 MED ORDER — CIPROFLOXACIN IN D5W 400 MG/200ML IV SOLN
INTRAVENOUS | Status: AC
  Filled 2020-07-21: qty 200

## 2020-07-21 MED ORDER — LIDOCAINE 2% (20 MG/ML) 5 ML SYRINGE
INTRAMUSCULAR | Status: DC | PRN
  Administered 2020-07-21: 60 mg via INTRAVENOUS

## 2020-07-21 MED ORDER — DEXAMETHASONE SODIUM PHOSPHATE 10 MG/ML IJ SOLN
INTRAMUSCULAR | Status: DC | PRN
  Administered 2020-07-21: 10 mg via INTRAVENOUS

## 2020-07-21 MED ORDER — GLUCAGON HCL RDNA (DIAGNOSTIC) 1 MG IJ SOLR
INTRAMUSCULAR | Status: DC | PRN
  Administered 2020-07-21 (×2): .25 mg via INTRAVENOUS

## 2020-07-21 NOTE — Transfer of Care (Signed)
Immediate Anesthesia Transfer of Care Note  Patient: TORE CARREKER  Procedure(s) Performed: Procedure(s): ENDOSCOPIC RETROGRADE CHOLANGIOPANCREATOGRAPHY (ERCP) WITH PROPOFOL (N/A) STENT REMOVAL SPHINCTERPLASTY REMOVAL OF STONES BILIARY STENT PLACEMENT (N/A)  Patient Location: PACU and Endoscopy Unit  Anesthesia Type:mac  Level of Consciousness: awake, alert  and oriented  Airway & Oxygen Therapy: Patient Spontanous Breathing and Patient connected to nasal cannula oxygen  Post-op Assessment: Report given to RN and Post -op Vital signs reviewed and stable  Post vital signs: Reviewed and stable  Last Vitals:  Vitals:   07/21/20 0722  BP: (!) 144/67  Resp: 22  Temp: 36.7 C  SpO2: 493%    Complications: No apparent anesthesia complications

## 2020-07-21 NOTE — Telephone Encounter (Signed)
Referral to [facility] has been made and a reminder to set up pt in 3 months for stent exchange.

## 2020-07-21 NOTE — Op Note (Signed)
[facility] Patient Name: [name] Procedure Date: 07/21/2020 MRN: [id number] Attending MD: Justice Britain , MD Date of Birth: 1946/11/26 CSN: [id number] Age: 74 Admit Type: Inpatient Procedure:                ERCP Indications:              Bile duct stone(s), Follow-up of ascending                            cholangitis, Stent change Providers:                Justice Britain, MD, Vista Lawman, RN, Lazaro Arms, Technician, Eliberto Ivory, CRNA Referring MD:             Johnathan Hausen Medicines:                General Anesthesia, Indomethacin 100 mg PR, Cipro                            400 mg IV, Glucagon 0.5 mg IV Complications:            No immediate complications. Estimated Blood Loss:     Estimated blood loss was minimal. Procedure:                Pre-Anesthesia Assessment:                           - Prior to the procedure, a History and Physical                            was performed, and patient medications and                            allergies were reviewed. The patient's tolerance of                            previous anesthesia was also reviewed. The risks                            and benefits of the procedure and the sedation                            options and risks were discussed with the patient.                            All questions were answered, and informed consent                            was obtained. Prior Anticoagulants: The patient has                            taken no previous anticoagulant or antiplatelet  agents except for aspirin. ASA Grade Assessment:                            III - A patient with severe systemic disease. After                            reviewing the risks and benefits, the patient was                            deemed in satisfactory condition to undergo the                            procedure.                           After obtaining  informed consent, the scope was                            passed under direct vision. Throughout the                            procedure, the patient's blood pressure, pulse, and                            oxygen saturations were monitored continuously. The                            [id number]-Q180V ([id number] Olympus Doudenoscope was                            introduced through the mouth, and used to inject                            contrast into and used to inject contrast into the                            bile duct. The ERCP was accomplished without                            difficulty. The patient tolerated the procedure. Scope In: Scope Out: Findings:      A biliary stent was visible on the scout film.      The esophagus was successfully intubated under direct vision without       detailed examination of the pharynx, larynx, and associated structures.       The upper GI tract was traversed under direct vision without detailed       examination. A J-shaped deformity was found in the entire examined       stomach (this required the patient to be placed in a left lateral       position to allow for duodenoscope passage into the duodenum). A biliary       sphincterotomy had been performed. The sphincterotomy appeared open. One       plastic biliary stent originating in the biliary tree was emerging from       the major  papilla. The stent was partially occluded. This stent was       removed from the biliary tree using a snare.      A short 0.035 inch Soft Jagwire was passed into the biliary tree. The       Autotome sphincterotome was passed over the guidewire and the bile duct       was then deeply cannulated. Contrast was injected. I personally       interpreted the bile duct images. Ductal flow of contrast was adequate.       Image quality was adequate. Contrast extended to the entire biliary       tree. Opacification of the entire biliary tree was successful. The main       bile duct  contained multiple filling defects thought to be stones and       sludge. The lower third of the main bile duct and upper third of the       main bile duct were moderately dilated. The largest diameter was 16 mm       in the upper third. The middle third of the main bile duct contained a       narrowing at the region of the cystic duct insertion with a diameter of       approxmately 7 mm compared to the other regions of the bile duct as       noted above. This narrowing was 8 mm in length. Dilation of the common       bile duct at the ampullar region with an 08-02-09 mm balloon (to a maximum       balloon size of 10 mm) dilator was successful as a sphincteroplasty for       4-minutes total. Dilation of the middle bile duct at the area of       narrowing noted above with an 08-02-09 mm balloon (to a maximum balloon       size of 8 mm) dilator was successful for 1 minute. The biliary tree was       swept with a retrieval balloon starting at the bifurcation, left main       hepatic duct and right main hepatic duct. Sludge was swept from the       duct. Multiple (>6) stones were removed. No stones remained. An       occlusion cholangiogram was performed that showed no further significant       biliary pathology other than what was described above. I have concern as       to the timing of this patient's follow up and eventual cholecystectomy,       so I decided to replace a biliary stent due to the significant stone       burden that had been present on today's evaluation/exam. One 10 Fr by 9       cm plastic biliary stent with a single external flap and a single       internal flap was placed into the common bile duct. Bile flowed through       the stent. The stent was in good position.      A pancreatogram was not performed.      The duodenoscope was withdrawn from the patient. Impression:               - J-shaped deformity in the entire stomach  requiring LL positioning  for advancement of                            duodenoscope.                           - Prior biliary sphincterotomy appeared open.                           - One partially occluded stent from the biliary                            tree was seen in the major papilla. This was                            removed.                           - Multiple filling defects consistent with stones                            and sludge were seen on the cholangiogram.                           - The upper third of the main bile duct and lower                            third of the main bile duct were moderately dilated.                           - A narrowing at the level of the cystic duct                            insertion was noted, though it was still at least 7                            mm in size. This area was dilated.                           - Sphincteroplasty was performed.                           - Choledocholithiasis was found. Complete removal                            was accomplished by sweeping after sphincteroplasty.                           - One plastic biliary stent was placed into the                            common bile duct in effort of decreasing recurrence  of choledocholithiasis while he awaits evaluation                            by surgery for cholecystectomy. Moderate Sedation:      Not Applicable - Patient had care per Anesthesia. Recommendation:           - The patient will be observed post-procedure,                            until all discharge criteria are met.                           - Discharge patient to home.                           - Patient has a contact number available for                            emergencies. The signs and symptoms of potential                            delayed complications were discussed with the                            patient. Return to normal activities tomorrow.                             Written discharge instructions were provided to the                            patient.                           - Low fat diet.                           - Watch for pancreatitis, bleeding, perforation,                            and cholangitis.                           - Observe patient's clinical course.                           - Refer back to [facility] for                            determination of candidacy for Cholecystectomy.                           - Return to this GI lab for stent removal at ERCP                            4-6 weeks after completion of cholecystectomy.                           -  The findings and recommendations were discussed                            with the patient.                           - The findings and recommendations were discussed                            with the patient's family. Procedure Code(s):        --- Professional ---                           [phone], Endoscopic retrograde                            cholangiopancreatography (ERCP); with removal and                            exchange of stent(s), biliary or pancreatic duct,                            including pre- and post-dilation and guide wire                            passage, when performed, including sphincterotomy,                            when performed, each stent exchanged                           [id number], Endoscopic retrograde                            cholangiopancreatography (ERCP); with removal of                            calculi/debris from biliary/pancreatic duct(s) Diagnosis Code(s):        --- Professional ---                           K31.89, Other diseases of stomach and duodenum                           K80.31, Calculus of bile duct with cholangitis,                            unspecified, with obstruction                           T85.590A, Other mechanical complication of bile                            duct prosthesis, initial  encounter                           Z46.59, Encounter for fitting and  adjustment of                            other gastrointestinal appliance and device                           K83.09, Other cholangitis                           K83.8, Other specified diseases of biliary tract                           R93.2, Abnormal findings on diagnostic imaging of                            liver and biliary tract CPT copyright 2019 American Medical Association. All rights reserved. The codes documented in this report are preliminary and upon coder review may  be revised to meet current compliance requirements. Justice Britain, MD 07/21/2020 10:47:07 AM Number of Addenda: 0

## 2020-07-21 NOTE — H&P (Signed)
GASTROENTEROLOGY PROCEDURE H&P NOTE   Primary Care Physician: Patient, No Pcp Per  HPI: [name] is a 74 y.o. male who presents for ERCP for bile duct stent exchange/pull with history of cholangitis and choledocholithiasis and still has gallbladder.  Past Medical History:  Diagnosis Date  . Atrial fibrillation ([place])   . Avascular necrosis of bones of both hips ([place])   . 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 Cancer ([place])   . Choledocholithiasis    HX  . Colonic polyp   . Complication of anesthesia    hard to wake up, "felt like died and revived me"  . COPD (chronic obstructive pulmonary disease) ([facility])    mild PFT abnormalities; normal ABG  . Degenerative disc disease, lumbar    HNP of the LS spine  . Diabetes mellitus    type 2  . Full dentures   . GERD (gastroesophageal reflux disease)   . Glaucoma   . H/O hiatal hernia   . Headache   . Hx MRSA infection 2010   sith splenic abcess  . Non Hodgkin's lymphoma ([place]) 06/2009   chemotherapy until 11/2010  . Obesity   . Obstructive sleep apnea    treated with CPAP and oxygen  . On supplemental oxygen therapy    wears at HS and PRN  . Orthostatic hypotension    lightheadness; no definate loss of consciousness  . Pedal edema   . Permanent atrial fibrillation ([place]) 01/10/2011   Low thromboembolic risk   . Pneumonia   . PONV (postoperative nausea and vomiting)   . Splenic abscess 07/2009  . Tobacco abuse    45 pack years   Past Surgical History:  Procedure Laterality Date  . ABSCESS DRAINAGE  2010   Splenic  . ANAL FISSURE 56  74years old  . BILIARY DILATION  09/02/2019   Procedure: BILIARY DILATION;  Surgeon: Rush Landmark Telford Nab., MD;  Location: [facility];  Service: Gastroenterology;;  . BILIARY STENT PLACEMENT N/A 09/02/2019   Procedure: BILIARY STENT PLACEMENT;  Surgeon: Irving Copas., MD;  Location: [facility];  Service: Gastroenterology;  Laterality: N/A;  . BIOPSY  09/02/2019   Procedure:  BIOPSY;  Surgeon: Rush Landmark Telford Nab., MD;  Location: [facility];  Service: Gastroenterology;;  . CATARACT EXTRACTION     Right with lens implant  . COLONOSCOPY W/ POLYPECTOMY  2008   with snare polypectomy  . ERCP N/A 09/02/2019   Procedure: ENDOSCOPIC RETROGRADE CHOLANGIOPANCREATOGRAPHY (ERCP);  Surgeon: Irving Copas., MD;  Location: [facility];  Service: Gastroenterology;  Laterality: N/A;  . EYE SURGERY     lens implant  . INGUINAL HERNIA REPAIR Left 03/09/2017   Procedure: OPEN REPAIR LEFT INGUINAL HERNIA;  Surgeon: Arta Bruce Kinsinger, MD;  Location: [facility];  Service: General;  Laterality: Left;  . INSERTION OF MESH Left 03/09/2017   Procedure: INSERTION OF MESH;  Surgeon: Arta Bruce Kinsinger, MD;  Location: [facility];  Service: General;  Laterality: Left;  . KNEE ARTHROSCOPY     right  . LYMPH NODE BIOPSY  2010   done x 2 for NHL diagnosis  . MULTIPLE TOOTH EXTRACTIONS    . REMOVAL OF STONES  09/02/2019   Procedure: REMOVAL OF STONES;  Surgeon: Rush Landmark Telford Nab., MD;  Location: [facility];  Service: Gastroenterology;;  . [name]  09/02/2019   Procedure: [name];  Surgeon: Irving Copas., MD;  Location: [facility];  Service: Gastroenterology;;  . TONSILLECTOMY    . TOTAL  HIP ARTHROPLASTY  12/13/2012   Procedure: TOTAL HIP ARTHROPLASTY ANTERIOR APPROACH;  Surgeon: Mcarthur Rossetti, MD;  Location: [facility];  Service: Orthopedics;  Laterality: Left;  Left Total Hip Arthroplasty, Anterior Approach (C-Arm)   Current Facility-Administered Medications  Medication Dose Route Frequency Provider Last Rate Last Admin  . 0.9 %  sodium chloride infusion   Intravenous Continuous Mansouraty, Telford Nab., MD      . lactated ringers infusion   Intravenous Continuous Mansouraty, Telford Nab., MD 20 mL/hr at 07/21/20 0758 New Bag at 07/21/20 0758   Facility-Administered Medications Ordered in Other Encounters  Medication Dose Route Frequency Provider  Last Rate Last Admin  . heparin lock flush 100 unit/mL  500 Units Intravenous Once Sinda Du, MD      . sodium chloride 0.9 % injection 10 mL  10 mL Intravenous PRN Sinda Du, MD       Allergies  Allergen Reactions  . Crestor [Rosuvastatin] Other (See Comments)    Severe headache  . Pred Forte [Prednisolone Acetate] Other (See Comments)    Severe burning  . Atorvastatin Other (See Comments)    Shaky, dizziness, headaches  . Codeine Other (See Comments)    Shakes    Family History  Problem Relation Age of Onset  . Heart failure Mother 44       results of chf  . Leukemia Father   . Colon cancer Sister   . Heart disease Brother   . Heart disease Brother   . Heart disease Brother   . Heart disease Brother   . Esophageal cancer Neg Hx   . Inflammatory bowel disease Neg Hx   . Liver disease Neg Hx   . Pancreatic cancer Neg Hx   . Stomach cancer Neg Hx    Social History   Socioeconomic History  . Marital status: Divorced    Spouse name: Not on file  . Number of children: 2  . Years of education: Not on file  . Highest education level: Not on file  Occupational History  . Occupation: retired/disability  Tobacco Use  . Smoking status: Current Every Day Smoker    Packs/day: 1.00    Years: 55.00    Pack years: 55.00    Types: Cigarettes  . Smokeless tobacco: Never Used  Vaping Use  . Vaping Use: Never used  Substance and Sexual Activity  . Alcohol use: No  . Drug use: No  . Sexual activity: Not on file  Other Topics Concern  . Not on file  Social History Narrative  . Not on file   Social Determinants of Health   Financial Resource Strain:   . Difficulty of Paying Living Expenses:   Food Insecurity:   . Worried About Charity fundraiser in the Last Year:   . Arboriculturist in the Last Year:   Transportation Needs:   . Film/video editor (Medical):   Marland Kitchen Lack of Transportation (Non-Medical):   Physical Activity:   . Days of Exercise per Week:     . Minutes of Exercise per Session:   Stress:   . Feeling of Stress :   Social Connections:   . Frequency of Communication with Friends and Family:   . Frequency of Social Gatherings with Friends and Family:   . Attends Religious Services:   . Active Member of Clubs or Organizations:   . Attends Archivist Meetings:   Marland Kitchen Marital Status:   Intimate Partner Violence:   . Fear  of Current or Ex-Partner:   . Emotionally Abused:   Marland Kitchen Physically Abused:   . Sexually Abused:     Physical Exam: Vital signs in last 24 hours: Temp:  [98.1 F (36.7 C)] 98.1 F (36.7 C) (07/28 0722) Resp:  [22] 22 (07/28 0722) BP: (144)/(67) 144/67 (07/28 0722) SpO2:  [100 %] 100 % (07/28 0722) Weight:  [106.1 kg] 106.1 kg (07/28 0722)   GEN: NAD EYE: Sclerae anicteric ENT: MMM CV: Non-tachycardic GI: Soft, NT/ND NEURO:  Alert & Oriented x 3  Lab Results: No results for input(s): WBC, HGB, HCT, PLT in the last 72 hours. BMET No results for input(s): NA, K, CL, CO2, GLUCOSE, BUN, CREATININE, CALCIUM in the last 72 hours. LFT No results for input(s): PROT, ALBUMIN, AST, ALT, ALKPHOS, BILITOT, BILIDIR, IBILI in the last 72 hours. PT/INR No results for input(s): LABPROT, INR in the last 72 hours.   Impression / Plan: This is a 74 y.o.male who presents for ERCP for bile duct stent exchange/pull with history of cholangitis and choledocholithiasis and still has gallbladder.  The risks of an ERCP were discussed at length, including but not limited to the risk of perforation, bleeding, abdominal pain, post-ERCP pancreatitis (while usually mild can be severe and even life threatening).  The risks and benefits of endoscopic evaluation were discussed with the patient; these include but are not limited to the risk of perforation, infection, bleeding, missed lesions, lack of diagnosis, severe illness requiring hospitalization, as well as anesthesia and sedation related illnesses.  The patient is  agreeable to proceed.    Justice Britain, MD [facility] Office # [phone]

## 2020-07-21 NOTE — Telephone Encounter (Signed)
-----   Message from Irving Copas., MD sent at 07/21/2020 10:47 AM EDT ----- Regarding: Referral to Surgery Rejina Odle, Please replace another surgery referral to [facility] for Cholecystectomy in setting of recurrent choledocholithiasis and biliary stenting. He had seen Dr. Hassell Done in the hospital back in 2020, but at this point can be seen by anyone. Semi-Urgent, so we can get this done. Also, please place a recall for CBD stent exchange in 67-months. Thanks. GM

## 2020-07-21 NOTE — Telephone Encounter (Signed)
I called this patient today to see if he was wanting to schedule his lung cancer screening CT that is due 06/2020.  He was telling me that he needs to have his gallbladder removed but they needed to remove his stent.  They removed the stent today but replaced it with a new stent. He states he is getting the run around. He states they have told him 3 different times that he was going to have the stent removed so he can have the gallbladder removed. He is very upset because he isn't getting any results. He states he has witnesses of what was told him about this issue

## 2020-07-21 NOTE — Telephone Encounter (Signed)
[name], Thank you for this message. I spoke in great depth to patient and patient's daughter after procedure and I had spoken with him before and after procedure. There is no contraindication for a patient to have a cholecystectomy with a bile duct stent being in place. We have replaced a new referral to Surgery to have him get cholecystectomy as soon as possible. I will remove biliary stent once he has his cholecystectomy . Stone burden noted on today's ERCP was too high for me to feel comfortable leaving him without a stent. Thank you. GM

## 2020-07-21 NOTE — Anesthesia Procedure Notes (Signed)
Procedure Name: Intubation Date/Time: 07/21/2020 9:20 AM Performed by: Gerald Leitz, CRNA Pre-anesthesia Checklist: Patient identified, Patient being monitored, Timeout performed, Emergency Drugs available and Suction available Patient Re-evaluated:Patient Re-evaluated prior to induction Oxygen Delivery Method: Circle system utilized Preoxygenation: Pre-oxygenation with 100% oxygen Induction Type: IV induction Ventilation: Mask ventilation without difficulty Laryngoscope Size: Mac and 3 Grade View: Grade I Tube type: Oral Tube size: 7.5 mm Number of attempts: 1 Placement Confirmation: ETT inserted through vocal cords under direct vision,  positive ETCO2 and breath sounds checked- equal and bilateral Secured at: 23 cm Tube secured with: Tape Dental Injury: Teeth and Oropharynx as per pre-operative assessment

## 2020-07-21 NOTE — Anesthesia Postprocedure Evaluation (Signed)
Anesthesia Post Note  Patient: SHANKAR SILBER  Procedure(s) Performed: ENDOSCOPIC RETROGRADE CHOLANGIOPANCREATOGRAPHY (ERCP) WITH PROPOFOL (N/A ) STENT REMOVAL SPHINCTERPLASTY REMOVAL OF STONES BILIARY STENT PLACEMENT (N/A )     Patient location during evaluation: Endoscopy Anesthesia Type: General Level of consciousness: awake Pain management: pain level controlled Vital Signs Assessment: post-procedure vital signs reviewed and stable Respiratory status: spontaneous breathing, nonlabored ventilation, respiratory function stable and patient connected to nasal cannula oxygen Cardiovascular status: blood pressure returned to baseline and stable Postop Assessment: no apparent nausea or vomiting Anesthetic complications: no   No complications documented.  Last Vitals:  Vitals:   07/21/20 1040 07/21/20 1050  BP: (!) 143/78 (!) 153/100  Pulse: 72 84  Resp: 14 16  Temp: 36.6 C   SpO2: 100% 99%    Last Pain:  Vitals:   07/21/20 1050  TempSrc:   PainSc: 0-No pain                 Kimia Finan P Iran Rowe

## 2020-07-21 NOTE — Discharge Instructions (Signed)
Endoscopic Retrograde Cholangiopancreatogram, Care After This sheet gives you information about how to care for yourself after your procedure. Your health care provider may also give you more specific instructions. If you have problems or questions, contact your health care provider. What can I expect after the procedure? After the procedure, it is common to have:  Soreness in your throat.  Nausea.  Bloating.  Dizziness.  Tiredness (fatigue). Follow these instructions at home:   Take over-the-counter and prescription medicines only as told by your health care provider.  Do not drive for 24 hours if you were given a medicine to help you relax (sedative) during your procedure. Have someone stay with you for 24 hours after the procedure.  Return to your normal activities as told by your health care provider. Ask your health care provider what activities are safe for you.  Return to eating what you normally do as soon as you feel well enough or as told by your health care provider.  Keep all follow-up visits as told by your health care provider. This is important. Contact a health care provider if:  You have pain in your abdomen that does not get better with medicine.  You develop signs of infection, such as: ? Chills. ? Feeling unwell. Get help right away if:  You have difficulty swallowing.  You have worsening pain in your throat, chest, or abdomen.  You vomit bright red blood or a substance that looks like coffee grounds.  You have bloody or very black stools.  You have a fever.  You have a sudden increase in swelling (bloating) in your abdomen. Summary  After the procedure, it is common to feel tired and to have some discomfort in your throat.  Contact your health care provider if you have signs of infection--such as chills or feeling unwell--or if you have pain that does not improve with medicine.  Get help right away if you have trouble swallowing, worsening  pain, bloody or black vomit, bloody or black stools, a fever, or increased swelling in your abdomen.  Keep all follow-up visits as told by your health care provider. This is important. This information is not intended to replace advice given to you by your health care provider. Make sure you discuss any questions you have with your health care provider. Document Revised: 11/23/2017 Document Reviewed: 10/30/2016 Elsevier Patient Education  2020 Elsevier Inc.  

## 2020-07-23 ENCOUNTER — Encounter (HOSPITAL_COMMUNITY): Payer: Self-pay | Admitting: Gastroenterology

## 2020-08-10 DIAGNOSIS — Z720 Tobacco use: Secondary | ICD-10-CM | POA: Diagnosis not present

## 2020-08-10 DIAGNOSIS — E7800 Pure hypercholesterolemia, unspecified: Secondary | ICD-10-CM | POA: Diagnosis not present

## 2020-08-10 DIAGNOSIS — I10 Essential (primary) hypertension: Secondary | ICD-10-CM | POA: Diagnosis not present

## 2020-08-10 DIAGNOSIS — K829 Disease of gallbladder, unspecified: Secondary | ICD-10-CM | POA: Diagnosis not present

## 2020-08-10 DIAGNOSIS — Z79899 Other long term (current) drug therapy: Secondary | ICD-10-CM | POA: Diagnosis not present

## 2020-09-02 ENCOUNTER — Ambulatory Visit: Payer: Medicare Other | Admitting: Cardiology

## 2020-09-08 ENCOUNTER — Ambulatory Visit: Payer: Self-pay | Admitting: General Surgery

## 2020-09-12 ENCOUNTER — Encounter: Payer: Self-pay | Admitting: Cardiology

## 2020-09-30 ENCOUNTER — Encounter: Payer: Self-pay | Admitting: Cardiology

## 2020-10-21 ENCOUNTER — Telehealth: Payer: Self-pay

## 2020-10-21 NOTE — Telephone Encounter (Signed)
Patty, I am OK with that date but the stent can stay in longer if necessary. Can you check with [facility] and see if we can see their last note from them and when they are planning his surgery for his gallbladder resection? I want to try and keep the stent until then because he had a large stone burden last time I did his procedure and would like to feel comfortable that we can just remove the stent because his gallbladder is out. Thank you.  GM

## 2020-10-21 NOTE — Telephone Encounter (Signed)
[facility] note given to Dr.Mansouraty

## 2020-10-21 NOTE — Telephone Encounter (Signed)
Dr Rush Landmark your first available is 12/27 is this ok?

## 2020-10-21 NOTE — Telephone Encounter (Signed)
I agree with this assessment and would proceed with surgery. Patient has clearly stated to me that he does not want any further intervention from cardiac standpoint and understands risks.  JG

## 2020-10-21 NOTE — Telephone Encounter (Signed)
[facility] is faxing the note over from the last office visit for you to review.  Ro is you see it can you put it on Dr Donneta Romberg desk?  Thank you

## 2020-10-21 NOTE — Telephone Encounter (Signed)
I have reviewed the notes from [place]. The overall assessment and recommendations were for the patient to undergo cholecystectomy with possible IOC.  However, Dr. Kieth Brightly needed to get final input from cardiology in regards to upcoming surgery; although he did and has done well with his ERCPs most recently. My goal, would be if possible, to keep him in with the stent until the cholecystectomy is performed so that once the cholecystectomy has been performed we can definitively make 1 final cleanout of the bile duct and remove the stent.  This would then hopefully prevent the patient from having recurrent choledocholithiasis before a cholecystectomy. Thus, if the patient understands what we are trying to do by decreasing his risk for recurrent choledocholithiasis then that is what I want to have done and we can wait on the ERCP for stent pull for another few months. Please update me if the patient has further questions. We will get these notes scanned into the chart. Thanks. GM

## 2020-10-21 NOTE — Telephone Encounter (Signed)
-----   Message from Timothy Lasso, RN sent at 07/21/2020 11:13 AM EDT ----- recall for CBD stent exchange in 87-months

## 2020-10-26 NOTE — Telephone Encounter (Signed)
Thank you for the update. We will get him scheduled quickly

## 2020-10-27 NOTE — Progress Notes (Signed)
Pt. Needs orders for upcomming surgery. PST and labs on 10/28/20.Thanks.

## 2020-10-27 NOTE — Patient Instructions (Addendum)
DUE TO COVID-19 ONLY ONE VISITOR IS ALLOWED TO COME WITH YOU AND STAY IN THE WAITING ROOM ONLY DURING PRE OP AND PROCEDURE DAY OF SURGERY. THE 1 VISITOR  MAY VISIT WITH YOU AFTER SURGERY IN YOUR PRIVATE ROOM DURING VISITING HOURS ONLY!  YOU NEED TO HAVE A COVID 19 TEST ON: 10/28/20, THIS TEST MUST BE DONE BEFORE SURGERY,  COVID TESTING SITE [address] [place] [address], IT IS ON THE RIGHT GOING OUT [address] APPROXIMATELY  2 MINUTES PAST [employer] ON THE RIGHT. ONCE YOUR COVID TEST IS COMPLETED,  PLEASE BEGIN THE QUARANTINE INSTRUCTIONS AS OUTLINED IN YOUR HANDOUT.                [name]  10/27/2020   Your procedure is scheduled on: 11/01/20   Report to [facility] Main  Entrance   Report to admitting at: 9:45 AM     Call this number if you have problems the morning of surgery [phone]    Remember: Do not eat solid food :After Midnight. Clear liquids from midnight until: 8:45 am.   CLEAR LIQUID DIET   Foods Allowed                                                                     Foods Excluded  Coffee and tea, regular and decaf                             liquids that you cannot  Plain Jell-O any favor except red or purple                                           see through such as: Fruit ices (not with fruit pulp)                                     milk, soups, orange juice  Iced Popsicles                                    All solid food Carbonated beverages, regular and diet                                    Cranberry, grape and apple juices Sports drinks like Gatorade Lightly seasoned clear broth or consume(fat free) Sugar, honey syrup  Sample Menu Breakfast                                Lunch                                     Supper Cranberry juice  Beef broth                            Chicken broth Jell-O                                     Grape juice                           Apple juice Coffee  or tea                        Jell-O                                      Popsicle                                                Coffee or tea                        Coffee or tea  _____________________________________________________________________  BRUSH YOUR TEETH MORNING OF SURGERY AND RINSE YOUR MOUTH OUT, NO CHEWING GUM CANDY OR MINTS.     Take these medicines the morning of surgery with A SIP OF WATER: Diltiazem.                               You may not have any metal on your body including hair pins and              piercings  Do not wear jewelry, lotions, powders or perfumes, deodorant             Men may shave face and neck.   Do not bring valuables to the hospital. [employer].  Contacts, dentures or bridgework may not be worn into surgery.  Leave suitcase in the car. After surgery it may be brought to your room.     Patients discharged the day of surgery will not be allowed to drive home. IF YOU ARE HAVING SURGERY AND GOING HOME THE SAME DAY, YOU MUST HAVE AN ADULT TO DRIVE YOU HOME AND BE WITH YOU FOR 24 HOURS. YOU MAY GO HOME BY TAXI OR UBER OR ORTHERWISE, BUT AN ADULT MUST ACCOMPANY YOU HOME AND STAY WITH YOU FOR 24 HOURS.  Name and phone number of your driver:  Special Instructions: N/A              Please read over the following fact sheets you were given: _____________________________________________________________________        [facility] - Preparing for Surgery Before surgery, you can play an important role.  Because skin is not sterile, your skin needs to be as free of germs as possible.  You can reduce the number of germs on your skin by washing with CHG (chlorahexidine gluconate) soap before surgery.  CHG is an antiseptic cleaner which kills germs and bonds with the skin to continue killing germs even after  washing. Please DO NOT use if you have an allergy to CHG or antibacterial soaps.  If your skin becomes  reddened/irritated stop using the CHG and inform your nurse when you arrive at Short Stay. Do not shave (including legs and underarms) for at least 48 hours prior to the first CHG shower.  You may shave your face/neck. Please follow these instructions carefully:  1.  Shower with CHG Soap the night before surgery and the  morning of Surgery.  2.  If you choose to wash your hair, wash your hair first as usual with your  normal  shampoo.  3.  After you shampoo, rinse your hair and body thoroughly to remove the  shampoo.                           4.  Use CHG as you would any other liquid soap.  You can apply chg directly  to the skin and wash                       Gently with a scrungie or clean washcloth.  5.  Apply the CHG Soap to your body ONLY FROM THE NECK DOWN.   Do not use on face/ open                           Wound or open sores. Avoid contact with eyes, ears mouth and genitals (private parts).                       Wash face,  Genitals (private parts) with your normal soap.             6.  Wash thoroughly, paying special attention to the area where your surgery  will be performed.  7.  Thoroughly rinse your body with warm water from the neck down.  8.  DO NOT shower/wash with your normal soap after using and rinsing off  the CHG Soap.                9.  Pat yourself dry with a clean towel.            10.  Wear clean pajamas.            11.  Place clean sheets on your bed the night of your first shower and do not  sleep with pets. Day of Surgery : Do not apply any lotions/deodorants the morning of surgery.  Please wear clean clothes to the hospital/surgery center.  FAILURE TO FOLLOW THESE INSTRUCTIONS MAY RESULT IN THE CANCELLATION OF YOUR SURGERY PATIENT SIGNATURE_________________________________  NURSE SIGNATURE__________________________________  ________________________________________________________________________

## 2020-10-28 ENCOUNTER — Other Ambulatory Visit: Payer: Self-pay

## 2020-10-28 ENCOUNTER — Ambulatory Visit: Payer: Self-pay | Admitting: General Surgery

## 2020-10-28 ENCOUNTER — Encounter (HOSPITAL_COMMUNITY): Payer: Self-pay

## 2020-10-28 ENCOUNTER — Encounter (HOSPITAL_COMMUNITY)
Admission: RE | Admit: 2020-10-28 | Discharge: 2020-10-28 | Disposition: A | Discharge status: Home / Self Care | Payer: Medicare Other | Source: Ambulatory Visit | Attending: General Surgery | Admitting: General Surgery

## 2020-10-28 ENCOUNTER — Other Ambulatory Visit (HOSPITAL_COMMUNITY)
Admission: RE | Admit: 2020-10-28 | Discharge: 2020-10-28 | Disposition: A | Discharge status: Home / Self Care | Payer: Medicare Other | Source: Ambulatory Visit | Attending: General Surgery | Admitting: General Surgery

## 2020-10-28 DIAGNOSIS — Z01812 Encounter for preprocedural laboratory examination: Secondary | ICD-10-CM | POA: Insufficient documentation

## 2020-10-28 DIAGNOSIS — Z20822 Contact with and (suspected) exposure to covid-19: Secondary | ICD-10-CM | POA: Diagnosis not present

## 2020-10-28 HISTORY — DX: Cardiac arrhythmia, unspecified: I49.9

## 2020-10-28 LAB — CBC
HCT: 46.6 % (ref 39.0–52.0)
Hemoglobin: 15.3 g/dL (ref 13.0–17.0)
MCH: 31.3 pg (ref 26.0–34.0)
MCHC: 32.8 g/dL (ref 30.0–36.0)
MCV: 95.3 fL (ref 80.0–100.0)
Platelets: 205 10*3/uL (ref 150–400)
RBC: 4.89 MIL/uL (ref 4.22–5.81)
RDW: 14.2 % (ref 11.5–15.5)
WBC: 9.6 10*3/uL (ref 4.0–10.5)
nRBC: 0 % (ref 0.0–0.2)

## 2020-10-28 LAB — BASIC METABOLIC PANEL
Anion gap: 10 (ref 5–15)
BUN: 17 mg/dL (ref 8–23)
CO2: 28 mmol/L (ref 22–32)
Calcium: 9.3 mg/dL (ref 8.9–10.3)
Chloride: 99 mmol/L (ref 98–111)
Creatinine, Ser: 1.1 mg/dL (ref 0.61–1.24)
GFR, Estimated: >60 mL/min (ref >60)
Glucose, Bld: 98 mg/dL (ref 70–99)
Potassium: 4.3 mmol/L (ref 3.5–5.1)
Sodium: 137 mmol/L (ref 135–145)

## 2020-10-28 LAB — HEMOGLOBIN A1C
Hgb A1c MFr Bld: 5.5 % (ref 4.8–5.6)
Mean Plasma Glucose: 111.15 mg/dL

## 2020-10-28 LAB — GLUCOSE, CAPILLARY: Glucose-Capillary: 98 mg/dL (ref 70–99)

## 2020-10-28 LAB — SARS CORONAVIRUS 2 (TAT 6-24 HRS): SARS Coronavirus 2: NEGATIVE

## 2020-10-28 NOTE — Progress Notes (Addendum)
COVID Vaccine Completed: Yes Date COVID Vaccine completed: 04/25/20 COVID vaccine manufacturer: Pfizer     PCP -Dr. Lauretta Grill K[place]ala  Cardiologist - Dr. Adrian Prows. LOV: 05/28/20  Chest x-ray -  EKG - 05/28/20 EPIC Stress Test -  ECHO - 09/01/19 EPIC Cardiac Cath -  Pacemaker/ICD device last checked:  Sleep Study - Yes CPAP - Yes  Fasting Blood Sugar - pt. Do not check CBG's at home Checks Blood Sugar ___0__ times a day  Blood Thinner Instructions: Aspirin Instructions: Last Dose:  Anesthesia review: Hx: Afib,COPD,OCA(CPAP,O2 )  Patient denies shortness of breath, fever, cough and chest pain at PAT appointment   Patient verbalized understanding of instructions that were given to them at the PAT appointment. Patient was also instructed that they will need to review over the PAT instructions again at home before surgery.

## 2020-10-29 NOTE — Progress Notes (Signed)
Anesthesia Chart Review   Case: [id number] Date/Time: 11/01/20 1130   Procedure: LAPAROSCOPIC CHOLECYSTECTOMY (N/A )   Anesthesia type: General   Pre-op diagnosis: CHOLEDOCHOLITHIASIS   [facility] ROOM 05 / [facility]   Surgeons: Kinsinger, Arta Bruce, MD      DISCUSSION:74 y.o. current every day smoker (55 pack years) with h/o PONV, COPD (2L O2 prn), GERD, OSA on CPAP (nocturnal O2 use), A-fib (no anticoagulation per pt request, per Dr. Einar Gip, "He is aware of the risk of stroke, in view of underlying peripheral arterial disease and probably underlying coronary artery disease, I have recommended that he start 81 mg aspirin which will not be a contraindication for his surgery or procedures."), DM II, non Hodgkin's Lymphoma (diagnosed 08/20/2009 via splenic biopsy and pancreatic tall mass biopsy; s/p 7 cycles R-CHOP chemotherapy), choledocholithiasis scheduled for above procedure 11/01/2020 with Dr. Gurney Maxin.    Per anesthesia chart review 06/10/2020, "For anesthesia history, it was documented on 12/09/2012 that he reported "hard to wake up,'felt like died and revived me'". He has since undergone left THA 12/13/2012 and open left inguinal hernia repair 03/09/2017."  Normal myocardial perfusion 05/31/2020, EF 34% (possibly due to calculation error from afib; 55-60% 09/01/2019 by echo), clinically low risk per Dr. Einar Gip.  Review PAT note 06/10/2020 by Myra Gianotti, PA-C for more history.     Per cardiology 10/21/2020, "I agree with this assessment and would proceed with surgery. Patient has clearly stated to me that he does not want any further intervention from cardiac standpoint and understands risks."  Anticipate pt can proceed with planned procedure barring acute status change and after evaluation DOS.   VS: BP (!) 155/77   Pulse 91   Temp 36.8 C (Oral)   Resp 18   Ht 6\' 4"  (1.93 m)   Wt 108.9 kg   SpO2 100%   BMI 29.21 kg/m   PROVIDERS: Lujean Amel, MD  Adrian Prows, MD is  Cardiologist  LABS: Labs reviewed: Acceptable for surgery. (all labs ordered are listed, but only abnormal results are displayed)  Labs Reviewed  GLUCOSE, CAPILLARY  CBC  BASIC METABOLIC PANEL  HEMOGLOBIN A1C     IMAGES:   EKG: EKG 05/28/2020: Atrial fibrillation with controlled ventricular sponsor rate of 84 bpm, normal axis.  Poor R wave progression, probably normal variant.  No evidence of ischemia.  Low-voltage complexes.  Consider pulmonary disease pattern. ABNORMAL RHYTHM   CV: Lexiscan (Walking with mod Bruce)Tetrofosmin Stress Test  06/01/2020: Nondiagnostic ECG stress. Underlying A. Fibrillation and occasional PVC.  Myocardial perfusion is normal. Stress LV EF: 34% with global hypokinesis. The calculated LVEF may be an error due to difficulty in gating from A. Fibrillation. Compared to the study done on 09/04/2019, inferior ischemia not present and EF 50%. This is a low to intermediate risk study.    Echo 09/01/2019 IMPRESSIONS    1. The left ventricle has low normal systolic function, with an ejection  fraction of 55-60%. The cavity size was normal. There is moderately  increased left ventricular wall thickness. Left ventricular diastolic  function could not be evaluated secondary  to atrial fibrillation. Inadequat for regional wall motion assessment.  2. The right ventricle has normal systolic function. The cavity was  normal. There is no increase in right ventricular wall thickness.  3. Moderate thickening of the mitral valve leaflet. Mild calcification of  the mitral valve leaflet. There is moderate mitral annular calcification  present.  4. The aortic valve is tricuspid. Mild sclerosis  of the aortic valve.  Aortic valve regurgitation was not assessed by color flow Doppler.  5. The aorta is normal unless otherwise noted.  6. The interatrial septum appears to be lipomatous.  7. Trivial pericardial effusion is present.  8. The pericardial effusion is  circumferential. Past Medical History:  Diagnosis Date  . Atrial fibrillation ([place])   . Avascular necrosis of bones of both hips ([place])   . 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 Cancer ([place])   . Choledocholithiasis    HX  . Colonic polyp   . Complication of anesthesia    hard to wake up, "felt like died and revived me"  . COPD (chronic obstructive pulmonary disease) ([facility])    mild PFT abnormalities; normal ABG  . Degenerative disc disease, lumbar    HNP of the LS spine  . Diabetes mellitus    type 2  . Dysrhythmia    A-fib  . Full dentures   . GERD (gastroesophageal reflux disease)   . Glaucoma   . H/O hiatal hernia   . Headache   . Hx MRSA infection 2010   sith splenic abcess  . Non Hodgkin's lymphoma ([place]) 06/2009   chemotherapy until 11/2010  . Obesity   . Obstructive sleep apnea    treated with CPAP and oxygen  . On supplemental oxygen therapy    wears at HS and PRN  . Orthostatic hypotension    lightheadness; no definate loss of consciousness  . Pedal edema   . Permanent atrial fibrillation ([place]) 01/10/2011   Low thromboembolic risk   . Pneumonia   . PONV (postoperative nausea and vomiting)   . Splenic abscess 07/2009  . Tobacco abuse    45 pack years    Past Surgical History:  Procedure Laterality Date  . ABSCESS DRAINAGE  2010   Splenic  . ANAL FISSURE 2  74years old  . BILIARY DILATION  09/02/2019   Procedure: BILIARY DILATION;  Surgeon: Rush Landmark Telford Nab., MD;  Location: [facility];  Service: Gastroenterology;;  . BILIARY STENT PLACEMENT N/A 09/02/2019   Procedure: BILIARY STENT PLACEMENT;  Surgeon: Irving Copas., MD;  Location: [facility];  Service: Gastroenterology;  Laterality: N/A;  . BILIARY STENT PLACEMENT N/A 07/21/2020   Procedure: BILIARY STENT PLACEMENT;  Surgeon: Rush Landmark Telford Nab., MD;  [facility];  Service: Gastroenterology;  Laterality: N/A;  . BIOPSY  09/02/2019   Procedure: BIOPSY;  Surgeon: Rush Landmark  Telford Nab., MD;  [facility];  Service: Gastroenterology;;  . CATARACT EXTRACTION     Right with lens implant  . COLONOSCOPY W/ POLYPECTOMY  2008   with snare polypectomy  . ENDOSCOPIC RETROGRADE CHOLANGIOPANCREATOGRAPHY (ERCP) WITH PROPOFOL N/A 07/21/2020   Procedure: ENDOSCOPIC RETROGRADE CHOLANGIOPANCREATOGRAPHY (ERCP) WITH PROPOFOL;  Surgeon: Rush Landmark Telford Nab., MD;  [facility];  Service: Gastroenterology;  Laterality: N/A;  . ERCP N/A 09/02/2019   Procedure: ENDOSCOPIC RETROGRADE CHOLANGIOPANCREATOGRAPHY (ERCP);  Surgeon: Irving Copas., MD;  Location: [facility];  Service: Gastroenterology;  Laterality: N/A;  . EYE SURGERY     lens implant  . INGUINAL HERNIA REPAIR Left 03/09/2017   Procedure: OPEN REPAIR LEFT INGUINAL HERNIA;  Surgeon: Arta Bruce Kinsinger, MD;  [facility];  Service: General;  Laterality: Left;  . INSERTION OF MESH Left 03/09/2017   Procedure: INSERTION OF MESH;  Surgeon: Arta Bruce Kinsinger, MD;  [facility];  Service: General;  Laterality: Left;  . KNEE ARTHROSCOPY     right  .  LYMPH NODE BIOPSY  2010   done x 2 for NHL diagnosis  . MULTIPLE TOOTH EXTRACTIONS    . REMOVAL OF STONES  09/02/2019   Procedure: REMOVAL OF STONES;  Surgeon: Rush Landmark Telford Nab., MD;  Location: [facility];  Service: Gastroenterology;;  . REMOVAL OF STONES  07/21/2020   Procedure: REMOVAL OF STONES;  Surgeon: Irving Copas., MD;  Location: [facility];  Service: Gastroenterology;;  . [name]  09/02/2019   Procedure: [name];  Surgeon: Irving Copas., MD;  Location: [facility];  Service: Gastroenterology;;  . [name]  07/21/2020   Procedure: Maricela Bo;  Surgeon: Irving Copas., MD;  Location: [facility];  Service: Gastroenterology;;  . Lavell Islam REMOVAL  07/21/2020   Procedure: STENT REMOVAL;  Surgeon: Irving Copas., MD;  [facility];  Service: Gastroenterology;;  .  TONSILLECTOMY    . TOTAL HIP ARTHROPLASTY  12/13/2012   Procedure: TOTAL HIP ARTHROPLASTY ANTERIOR APPROACH;  Surgeon: Mcarthur Rossetti, MD;  [facility];  Service: Orthopedics;  Laterality: Left;  Left Total Hip Arthroplasty, Anterior Approach (C-Arm)    MEDICATIONS: . acetaminophen (TYLENOL) 500 MG tablet  . aspirin EC 81 MG tablet  . diltiazem (CARDIZEM CD) 120 MG 24 hr capsule  . ipratropium-albuterol (DUONEB) 0.5-2.5 (3) MG/3ML SOLN  . latanoprost (XALATAN) 0.005 % ophthalmic solution  . OXYGEN  . simvastatin (ZOCOR) 10 MG tablet  . timolol (TIMOPTIC) 0.5 % ophthalmic solution   No current facility-administered medications for this encounter.   . heparin lock flush 100 unit/mL  . sodium chloride 0.9 % injection 10 mL    Konrad Felix, PA-C [facility] [phone]

## 2020-11-01 ENCOUNTER — Ambulatory Visit (HOSPITAL_COMMUNITY): Payer: Medicare Other | Admitting: Physician Assistant

## 2020-11-01 ENCOUNTER — Ambulatory Visit (HOSPITAL_COMMUNITY): Payer: Medicare Other | Admitting: Certified Registered"

## 2020-11-01 ENCOUNTER — Encounter (HOSPITAL_COMMUNITY)
Admission: RE | Disposition: A | Discharge status: Home / Self Care | Payer: Self-pay | Source: Ambulatory Visit | Attending: General Surgery

## 2020-11-01 ENCOUNTER — Ambulatory Visit (HOSPITAL_COMMUNITY)
Admission: RE | Admit: 2020-11-01 | Discharge: 2020-11-01 | Disposition: A | Discharge status: Home / Self Care | Payer: Medicare Other | Source: Ambulatory Visit | Attending: General Surgery | Admitting: General Surgery

## 2020-11-01 ENCOUNTER — Ambulatory Visit (HOSPITAL_COMMUNITY): Payer: Medicare Other

## 2020-11-01 ENCOUNTER — Encounter (HOSPITAL_COMMUNITY): Payer: Self-pay | Admitting: General Surgery

## 2020-11-01 DIAGNOSIS — J449 Chronic obstructive pulmonary disease, unspecified: Secondary | ICD-10-CM | POA: Insufficient documentation

## 2020-11-01 DIAGNOSIS — Z885 Allergy status to narcotic agent status: Secondary | ICD-10-CM | POA: Diagnosis not present

## 2020-11-01 DIAGNOSIS — K8060 Calculus of gallbladder and bile duct with cholecystitis, unspecified, without obstruction: Secondary | ICD-10-CM | POA: Diagnosis present

## 2020-11-01 DIAGNOSIS — Z888 Allergy status to other drugs, medicaments and biological substances status: Secondary | ICD-10-CM | POA: Insufficient documentation

## 2020-11-01 DIAGNOSIS — I4821 Permanent atrial fibrillation: Secondary | ICD-10-CM | POA: Insufficient documentation

## 2020-11-01 DIAGNOSIS — K8010 Calculus of gallbladder with chronic cholecystitis without obstruction: Secondary | ICD-10-CM | POA: Insufficient documentation

## 2020-11-01 DIAGNOSIS — Z8572 Personal history of non-Hodgkin lymphomas: Secondary | ICD-10-CM | POA: Insufficient documentation

## 2020-11-01 DIAGNOSIS — Z9981 Dependence on supplemental oxygen: Secondary | ICD-10-CM | POA: Diagnosis not present

## 2020-11-01 DIAGNOSIS — Z79899 Other long term (current) drug therapy: Secondary | ICD-10-CM | POA: Diagnosis not present

## 2020-11-01 DIAGNOSIS — G4733 Obstructive sleep apnea (adult) (pediatric): Secondary | ICD-10-CM | POA: Diagnosis not present

## 2020-11-01 DIAGNOSIS — Z6829 Body mass index (BMI) 29.0-29.9, adult: Secondary | ICD-10-CM | POA: Insufficient documentation

## 2020-11-01 DIAGNOSIS — Z800 Family history of malignant neoplasm of digestive organs: Secondary | ICD-10-CM | POA: Diagnosis not present

## 2020-11-01 DIAGNOSIS — K828 Other specified diseases of gallbladder: Secondary | ICD-10-CM | POA: Insufficient documentation

## 2020-11-01 DIAGNOSIS — Z7982 Long term (current) use of aspirin: Secondary | ICD-10-CM | POA: Insufficient documentation

## 2020-11-01 DIAGNOSIS — E119 Type 2 diabetes mellitus without complications: Secondary | ICD-10-CM | POA: Insufficient documentation

## 2020-11-01 DIAGNOSIS — Z419 Encounter for procedure for purposes other than remedying health state, unspecified: Secondary | ICD-10-CM

## 2020-11-01 DIAGNOSIS — F17210 Nicotine dependence, cigarettes, uncomplicated: Secondary | ICD-10-CM | POA: Diagnosis not present

## 2020-11-01 DIAGNOSIS — K219 Gastro-esophageal reflux disease without esophagitis: Secondary | ICD-10-CM | POA: Diagnosis not present

## 2020-11-01 DIAGNOSIS — Z8249 Family history of ischemic heart disease and other diseases of the circulatory system: Secondary | ICD-10-CM | POA: Diagnosis not present

## 2020-11-01 DIAGNOSIS — E669 Obesity, unspecified: Secondary | ICD-10-CM | POA: Diagnosis not present

## 2020-11-01 DIAGNOSIS — Z86010 Personal history of colonic polyps: Secondary | ICD-10-CM | POA: Insufficient documentation

## 2020-11-01 DIAGNOSIS — Z806 Family history of leukemia: Secondary | ICD-10-CM | POA: Diagnosis not present

## 2020-11-01 HISTORY — PX: CHOLECYSTECTOMY: SHX55

## 2020-11-01 LAB — COMPREHENSIVE METABOLIC PANEL
ALT: 16 U/L (ref 0–44)
AST: 18 U/L (ref 15–41)
Albumin: 4 g/dL (ref 3.5–5.0)
Alkaline Phosphatase: 61 U/L (ref 38–126)
Anion gap: 9 (ref 5–15)
BUN: 18 mg/dL (ref 8–23)
CO2: 23 mmol/L (ref 22–32)
Calcium: 9.2 mg/dL (ref 8.9–10.3)
Chloride: 106 mmol/L (ref 98–111)
Creatinine, Ser: 1.07 mg/dL (ref 0.61–1.24)
GFR, Estimated: >60 mL/min (ref >60)
Glucose, Bld: 100 mg/dL — ABNORMAL HIGH (ref 70–99)
Potassium: 4 mmol/L (ref 3.5–5.1)
Sodium: 138 mmol/L (ref 135–145)
Total Bilirubin: 0.9 mg/dL (ref 0.3–1.2)
Total Protein: 6.9 g/dL (ref 6.5–8.1)

## 2020-11-01 LAB — GLUCOSE, CAPILLARY
Glucose-Capillary: 101 mg/dL — ABNORMAL HIGH (ref 70–99)
Glucose-Capillary: 121 mg/dL — ABNORMAL HIGH (ref 70–99)

## 2020-11-01 SURGERY — LAPAROSCOPIC CHOLECYSTECTOMY
Anesthesia: General | Site: Abdomen

## 2020-11-01 MED ORDER — IOHEXOL 300 MG/ML  SOLN
INTRAMUSCULAR | Status: DC | PRN
  Administered 2020-11-01: 7 mL

## 2020-11-01 MED ORDER — DEXAMETHASONE SODIUM PHOSPHATE 10 MG/ML IJ SOLN
INTRAMUSCULAR | Status: DC | PRN
  Administered 2020-11-01: 4 mg via INTRAVENOUS

## 2020-11-01 MED ORDER — LACTATED RINGERS IR SOLN
Status: DC | PRN
  Administered 2020-11-01: 1000 mL

## 2020-11-01 MED ORDER — KETOROLAC TROMETHAMINE 15 MG/ML IJ SOLN
INTRAMUSCULAR | Status: AC
  Administered 2020-11-01: 15 mg via INTRAVENOUS
  Filled 2020-11-01: qty 1

## 2020-11-01 MED ORDER — LIDOCAINE 2% (20 MG/ML) 5 ML SYRINGE
INTRAMUSCULAR | Status: AC
  Filled 2020-11-01: qty 5

## 2020-11-01 MED ORDER — ACETAMINOPHEN 500 MG PO TABS
ORAL_TABLET | ORAL | Status: AC
  Administered 2020-11-01: 1000 mg via ORAL
  Filled 2020-11-01: qty 2

## 2020-11-01 MED ORDER — FENTANYL CITRATE (PF) 100 MCG/2ML IJ SOLN
INTRAMUSCULAR | Status: AC
  Filled 2020-11-01: qty 2

## 2020-11-01 MED ORDER — BUPIVACAINE LIPOSOME 1.3 % IJ SUSP
20.0000 mL | Freq: Once | INTRAMUSCULAR | Status: DC
  Filled 2020-11-01: qty 20

## 2020-11-01 MED ORDER — CEFAZOLIN SODIUM-DEXTROSE 2-4 GM/100ML-% IV SOLN
INTRAVENOUS | Status: AC
  Filled 2020-11-01: qty 100

## 2020-11-01 MED ORDER — FENTANYL CITRATE (PF) 100 MCG/2ML IJ SOLN
INTRAMUSCULAR | Status: DC | PRN
  Administered 2020-11-01 (×2): 50 ug via INTRAVENOUS

## 2020-11-01 MED ORDER — PROPOFOL 10 MG/ML IV BOLUS
INTRAVENOUS | Status: AC
  Filled 2020-11-01: qty 40

## 2020-11-01 MED ORDER — HYDROCODONE-ACETAMINOPHEN 5-325 MG PO TABS: 1.0000 | ORAL_TABLET | Freq: Four times a day (QID) | ORAL | 0 refills | Status: DC | PRN

## 2020-11-01 MED ORDER — CHLORHEXIDINE GLUCONATE 0.12 % MT SOLN
15.0000 mL | Freq: Once | OROMUCOSAL | Status: AC
  Administered 2020-11-01: 15 mL via OROMUCOSAL

## 2020-11-01 MED ORDER — ESMOLOL HCL 100 MG/10ML IV SOLN
INTRAVENOUS | Status: AC
  Filled 2020-11-01: qty 10

## 2020-11-01 MED ORDER — CHLORHEXIDINE GLUCONATE CLOTH 2 % EX PADS: 6.0000 | MEDICATED_PAD | Freq: Once | CUTANEOUS | Status: DC

## 2020-11-01 MED ORDER — BUPIVACAINE-EPINEPHRINE (PF) 0.25% -1:200000 IJ SOLN
INTRAMUSCULAR | Status: AC
  Filled 2020-11-01: qty 30

## 2020-11-01 MED ORDER — CEFAZOLIN SODIUM-DEXTROSE 2-4 GM/100ML-% IV SOLN
2.0000 g | INTRAVENOUS | Status: AC
  Administered 2020-11-01: 2 g via INTRAVENOUS

## 2020-11-01 MED ORDER — ACETAMINOPHEN 500 MG PO TABS: 1000.0000 mg | ORAL_TABLET | ORAL | Status: AC

## 2020-11-01 MED ORDER — ONDANSETRON HCL 4 MG/2ML IJ SOLN
INTRAMUSCULAR | Status: DC | PRN
  Administered 2020-11-01: 4 mg via INTRAVENOUS

## 2020-11-01 MED ORDER — SUGAMMADEX SODIUM 200 MG/2ML IV SOLN
INTRAVENOUS | Status: DC | PRN
  Administered 2020-11-01: 200 mg via INTRAVENOUS

## 2020-11-01 MED ORDER — LACTATED RINGERS IV SOLN: INTRAVENOUS | Status: DC

## 2020-11-01 MED ORDER — PHENYLEPHRINE 40 MCG/ML (10ML) SYRINGE FOR IV PUSH (FOR BLOOD PRESSURE SUPPORT)
PREFILLED_SYRINGE | INTRAVENOUS | Status: DC | PRN
  Administered 2020-11-01: 40 ug via INTRAVENOUS
  Administered 2020-11-01: 80 ug via INTRAVENOUS

## 2020-11-01 MED ORDER — ORAL CARE MOUTH RINSE: 15.0000 mL | Freq: Once | OROMUCOSAL | Status: AC

## 2020-11-01 MED ORDER — ROCURONIUM BROMIDE 10 MG/ML (PF) SYRINGE
PREFILLED_SYRINGE | INTRAVENOUS | Status: DC | PRN
  Administered 2020-11-01: 70 mg via INTRAVENOUS

## 2020-11-01 MED ORDER — LIDOCAINE 2% (20 MG/ML) 5 ML SYRINGE
INTRAMUSCULAR | Status: DC | PRN
  Administered 2020-11-01: 80 mg via INTRAVENOUS

## 2020-11-01 MED ORDER — ENSURE PRE-SURGERY PO LIQD
296.0000 mL | Freq: Once | ORAL | Status: DC
  Filled 2020-11-01: qty 296

## 2020-11-01 MED ORDER — BUPIVACAINE-EPINEPHRINE 0.25% -1:200000 IJ SOLN
INTRAMUSCULAR | Status: DC | PRN
  Administered 2020-11-01: 30 mL

## 2020-11-01 MED ORDER — 0.9 % SODIUM CHLORIDE (POUR BTL) OPTIME
TOPICAL | Status: DC | PRN
  Administered 2020-11-01: 1000 mL

## 2020-11-01 MED ORDER — ROCURONIUM BROMIDE 10 MG/ML (PF) SYRINGE
PREFILLED_SYRINGE | INTRAVENOUS | Status: AC
  Filled 2020-11-01: qty 10

## 2020-11-01 MED ORDER — ONDANSETRON HCL 4 MG/2ML IJ SOLN
INTRAMUSCULAR | Status: AC
  Filled 2020-11-01: qty 2

## 2020-11-01 MED ORDER — KETOROLAC TROMETHAMINE 15 MG/ML IJ SOLN: 15.0000 mg | INTRAMUSCULAR | Status: AC

## 2020-11-01 MED ORDER — PROPOFOL 10 MG/ML IV BOLUS
INTRAVENOUS | Status: DC | PRN
  Administered 2020-11-01: 20 mg via INTRAVENOUS
  Administered 2020-11-01: 180 mg via INTRAVENOUS

## 2020-11-01 MED ORDER — DEXAMETHASONE SODIUM PHOSPHATE 10 MG/ML IJ SOLN
INTRAMUSCULAR | Status: AC
  Filled 2020-11-01: qty 1

## 2020-11-01 SURGICAL SUPPLY — 51 items
ADH SKN CLS APL DERMABOND .7 (GAUZE/BANDAGES/DRESSINGS) ×1
APL PRP STRL LF DISP 70% ISPRP (MISCELLANEOUS) ×1
APL SKNCLS STERI-STRIP NONHPOA (GAUZE/BANDAGES/DRESSINGS) ×1
APPLIER CLIP 5 13 M/L LIGAMAX5 (MISCELLANEOUS)
APPLIER CLIP ROT 10 11.4 M/L (STAPLE) ×2
APR CLP MED LRG 11.4X10 (STAPLE) ×1
APR CLP MED LRG 5 ANG JAW (MISCELLANEOUS)
BAG SPEC RTRVL 10 TROC 200 (ENDOMECHANICALS) ×1
BENZOIN TINCTURE PRP APPL 2/3 (GAUZE/BANDAGES/DRESSINGS) ×2 IMPLANT
BNDG ADH 1X3 SHEER STRL LF (GAUZE/BANDAGES/DRESSINGS) ×8 IMPLANT
BNDG ADH THN 3X1 STRL LF (GAUZE/BANDAGES/DRESSINGS) ×4
CABLE HIGH FREQUENCY MONO STRZ (ELECTRODE) ×2 IMPLANT
CHLORAPREP W/TINT 26 (MISCELLANEOUS) ×2 IMPLANT
CLIP APPLIE 5 13 M/L LIGAMAX5 (MISCELLANEOUS) IMPLANT
CLIP APPLIE ROT 10 11.4 M/L (STAPLE) ×1 IMPLANT
CLIP VESOLOCK LG 6/CT PURPLE (CLIP) IMPLANT
CLIP VESOLOCK MED LG 6/CT (CLIP) IMPLANT
COVER MAYO STAND STRL (DRAPES) ×2 IMPLANT
COVER SURGICAL LIGHT HANDLE (MISCELLANEOUS) ×2 IMPLANT
COVER WAND RF STERILE (DRAPES) IMPLANT
DECANTER SPIKE VIAL GLASS SM (MISCELLANEOUS) ×2 IMPLANT
DERMABOND ADVANCED (GAUZE/BANDAGES/DRESSINGS) ×1
DERMABOND ADVANCED .7 DNX12 (GAUZE/BANDAGES/DRESSINGS) ×1 IMPLANT
DRAIN CHANNEL 19F RND (DRAIN) IMPLANT
DRAPE C-ARM 42X120 X-RAY (DRAPES) ×2 IMPLANT
EVACUATOR SILICONE 100CC (DRAIN) IMPLANT
GLOVE BIOGEL PI IND STRL 7.0 (GLOVE) ×1 IMPLANT
GLOVE BIOGEL PI INDICATOR 7.0 (GLOVE) ×1
GLOVE SURG SS PI 7.0 STRL IVOR (GLOVE) ×2 IMPLANT
GOWN STRL REUS W/TWL LRG LVL3 (GOWN DISPOSABLE) ×2 IMPLANT
GOWN STRL REUS W/TWL XL LVL3 (GOWN DISPOSABLE) ×4 IMPLANT
GRASPER SUT TROCAR 14GX15 (MISCELLANEOUS) IMPLANT
KIT BASIN OR (CUSTOM PROCEDURE TRAY) ×2 IMPLANT
KIT TURNOVER KIT A (KITS) ×1 IMPLANT
POUCH RETRIEVAL ECOSAC 10 (ENDOMECHANICALS) ×1 IMPLANT
POUCH RETRIEVAL ECOSAC 10MM (ENDOMECHANICALS) ×2
SCISSORS LAP 5X35 DISP (ENDOMECHANICALS) ×2 IMPLANT
SET CHOLANGIOGRAPH MIX (MISCELLANEOUS) ×1 IMPLANT
SET IRRIG TUBING LAPAROSCOPIC (IRRIGATION / IRRIGATOR) ×2 IMPLANT
SET TUBE SMOKE EVAC HIGH FLOW (TUBING) ×2 IMPLANT
SLEEVE XCEL OPT CAN 5 100 (ENDOMECHANICALS) ×4 IMPLANT
STOPCOCK 4 WAY LG BORE MALE ST (IV SETS) IMPLANT
STRIP CLOSURE SKIN 1/2X4 (GAUZE/BANDAGES/DRESSINGS) ×2 IMPLANT
SUT ETHILON 2 0 PS N (SUTURE) IMPLANT
SUT MNCRL AB 4-0 PS2 18 (SUTURE) ×2 IMPLANT
SUT VICRYL 0 ENDOLOOP (SUTURE) IMPLANT
TOWEL OR 17X26 10 PK STRL BLUE (TOWEL DISPOSABLE) ×2 IMPLANT
TOWEL OR NON WOVEN STRL DISP B (DISPOSABLE) IMPLANT
TRAY LAPAROSCOPIC (CUSTOM PROCEDURE TRAY) ×2 IMPLANT
TROCAR BLADELESS OPT 5 100 (ENDOMECHANICALS) ×2 IMPLANT
TROCAR XCEL NON-BLD 11X100MML (ENDOMECHANICALS) ×2 IMPLANT

## 2020-11-01 NOTE — Op Note (Signed)
PATIENT:  [name]  74 y.o. male  PRE-OPERATIVE DIAGNOSIS:  CHOLEDOCHOLITHIASIS  POST-OPERATIVE DIAGNOSIS:  CHOLEDOCHOLITHIASIS  PROCEDURE:  Procedure(s): LAPAROSCOPIC CHOLECYSTECTOMY WITH INTRAOPERATIVE CHOLANGIOGRAM   SURGEON:  Surgeon(s): Sairah Knobloch, Arta Bruce, MD Stechschulte, Nickola Major, MD  ASSISTANT: Louanna Raw  ANESTHESIA:   local and general  Indications for procedure: [name] is a 74 y.o. male with symptoms of Abdominal pain and Nausea and vomiting consistent with gallbladder disease, Confirmed by Ultrasound.  Description of procedure: The patient was brought into the operative suite, placed supine. Anesthesia was administered with endotracheal tube. Patient was strapped in place and foot board was secured. All pressure points were offloaded by foam padding. The patient was prepped and draped in the usual sterile fashion.  A small incision was made to the right of the umbilicus. A 28mm trocar was inserted into the peritoneal cavity with optical entry. Pneumoperitoneum was applied with high flow low pressure. 2 70mm trocars were placed in the RUQ. A 39mm trocar was placed in the subxiphoid space. Marcaine was infused to the subxiphoid space and lateral upper right abdomen in the transversus abdominis plane. Next the patient was placed in reverse trendelenberg. There were multiple adhesions of omentum to the body of the gallbladder. These were taken down with cautery.  The gallbladder was retracted cephalad and lateral. The peritoneum was reflected off the infundibulum working lateral to medial. The cystic duct and cystic artery were identified and further dissection revealed a critical view, due to concern for choledocholithiasis a cholangiogram was performed with ductotomy and cook catheter passed through a separate subcostal stab incision. Stent was seen and normal emptying of the bile duct was seen. The cystic duct and cystic artery were doubly clipped and ligated.    The gallbladder was removed off the liver bed with cautery. The Gallbladder was placed in a specimen bag. The gallbladder fossa was irrigated and hemostasis was applied with cautery. The gallbladder was removed via the 39mm trocar. No dilation was required for removal, therefore no fascial closure was performed. Pneumoperitoneum was removed, all trocar were removed. All incisions were closed with 4-0 monocryl subcuticular stitch. The patient woke from anesthesia and was brought to PACU in stable condition. All counts were correct  Findings: chronically inflamed gallbladder  Specimen: gallbladder  Blood loss: 20 ml  Local anesthesia: 30 ml marcaine  Complications: none  PLAN OF CARE: Discharge to home after PACU  PATIENT DISPOSITION:  PACU - hemodynamically stable.  Images:     Gurney Maxin, M.D. General, Bariatric, & Minimally Invasive Surgery [facility]

## 2020-11-01 NOTE — Anesthesia Preprocedure Evaluation (Signed)
Anesthesia Evaluation  Patient identified by MRN, date of birth, ID band Patient awake    Reviewed: Allergy & Precautions, NPO status , Patient's Chart, lab work & pertinent test results  History of Anesthesia Complications Negative for: history of anesthetic complications  Airway Mallampati: I  TM Distance: >3 FB Neck ROM: Full    Dental  (+) Edentulous Upper, Edentulous Lower   Pulmonary sleep apnea and Continuous Positive Airway Pressure Ventilation , COPD,  oxygen dependent, Current Smoker,    Pulmonary exam normal        Cardiovascular Normal cardiovascular exam+ dysrhythmias Atrial Fibrillation      Neuro/Psych negative neurological ROS  negative psych ROS   GI/Hepatic Neg liver ROS, hiatal hernia, GERD  ,  Endo/Other  negative endocrine ROSdiabetes  Renal/GU negative Renal ROS  negative genitourinary   Musculoskeletal  (+) Arthritis ,   Abdominal   Peds  Hematology H/o non-Hodgkin's lymphoma s/p chemo   Anesthesia Other Findings  Myoview 05/31/20: Nondiagnostic ECG stress. Underlying A. Fibrillation and occasional PVC.  Myocardial perfusion is normal. Stress LV EF: 34% with global hypokinesis. The calculated LVEF may be an  error due to difficulty in gating from A. Fibrillation. Compared to the study done on 09/04/2019, inferior ischemia not present  and EF 50%. This is a low to intermediate risk study.   Reproductive/Obstetrics                           Anesthesia Physical Anesthesia Plan  ASA: III  Anesthesia Plan: General   Post-op Pain Management:    Induction: Intravenous  PONV Risk Score and Plan: 2 and Ondansetron, Dexamethasone, Treatment may vary due to age or medical condition and Midazolam  Airway Management Planned: Oral ETT  Additional Equipment: None  Intra-op Plan:   Post-operative Plan: Extubation in OR  Informed Consent: I have reviewed the patients  History and Physical, chart, labs and discussed the procedure including the risks, benefits and alternatives for the proposed anesthesia with the patient or authorized representative who has indicated his/her understanding and acceptance.     Dental advisory given  Plan Discussed with:   Anesthesia Plan Comments:        Anesthesia Quick Evaluation

## 2020-11-01 NOTE — Telephone Encounter (Signed)
Glad he did well.

## 2020-11-01 NOTE — Anesthesia Procedure Notes (Signed)
Procedure Name: Intubation Date/Time: 11/01/2020 11:52 AM Performed by: Tobey Lippard, Clinical cytogeneticist D, CRNA Pre-anesthesia Checklist: Patient identified, Emergency Drugs available, Suction available and Patient being monitored Patient Re-evaluated:Patient Re-evaluated prior to induction Oxygen Delivery Method: Circle system utilized Preoxygenation: Pre-oxygenation with 100% oxygen Induction Type: IV induction Ventilation: Mask ventilation without difficulty Laryngoscope Size: Mac and 4 Grade View: Grade I Tube type: Oral Number of attempts: 1 Airway Equipment and Method: Stylet and Oral airway Placement Confirmation: ETT inserted through vocal cords under direct vision,  positive ETCO2 and breath sounds checked- equal and bilateral Tube secured with: Tape Dental Injury: Teeth and Oropharynx as per pre-operative assessment  Comments: Placed by Hilda Blades

## 2020-11-01 NOTE — Telephone Encounter (Signed)
We got the gallbladder out today. Shot an IOC and looked like the Stent is working well

## 2020-11-01 NOTE — H&P (Signed)
[name] is an 74 y.o. male.   Chief Complaint: gallbladder disease HPI: 74 yo male with long history of gallbladder disease who had a choledocholithiasis and has a biliary stent in place. He has heart disease which is stable and presents for gallbladder removal.  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   Cancer [facility])    Choledocholithiasis    HX   Colonic polyp    Complication of anesthesia    hard to wake up, "felt like died and revived me"   COPD (chronic obstructive pulmonary disease) (HCC)    mild PFT abnormalities; normal ABG   Degenerative disc disease, lumbar    HNP of the LS spine   Diabetes mellitus    type 2   Dysrhythmia    A-fib   Full dentures    GERD (gastroesophageal reflux disease)    Glaucoma    H/O hiatal hernia    Headache    Hx MRSA infection 2010   sith splenic abcess   Non Hodgkin's lymphoma ([place]) 06/2009   chemotherapy until 11/2010   Obesity    Obstructive sleep apnea    treated with CPAP and oxygen   On supplemental oxygen therapy    wears at HS and PRN   Orthostatic hypotension    lightheadness; no definate loss of consciousness   Pedal edema    Permanent atrial fibrillation ([place]) 01/10/2011   Low thromboembolic risk    Pneumonia    PONV (postoperative nausea and vomiting)    Splenic abscess 07/2009   Tobacco abuse    45 pack years    Past Surgical History:  Procedure Laterality Date   ABSCESS DRAINAGE  2010   Splenic   ANAL FISSURE REPAIR  74years old   BILIARY DILATION  09/02/2019   Procedure: BILIARY DILATION;  Surgeon: Irving Copas., MD;  Location: [facility];  Service: Gastroenterology;;   BILIARY STENT PLACEMENT N/A 09/02/2019   Procedure: BILIARY STENT PLACEMENT;  Surgeon: Irving Copas., MD;  Location: [facility];  Service: Gastroenterology;  Laterality: N/A;    BILIARY STENT PLACEMENT N/A 07/21/2020   Procedure: BILIARY STENT PLACEMENT;  Surgeon: Rush Landmark Telford Nab., MD;  Location: [facility];  Service: Gastroenterology;  Laterality: N/A;   BIOPSY  09/02/2019   Procedure: BIOPSY;  Surgeon: Irving Copas., MD;  Location: [facility];  Service: Gastroenterology;;   CATARACT EXTRACTION     Right with lens implant   COLONOSCOPY W/ POLYPECTOMY  2008   with snare polypectomy   ENDOSCOPIC RETROGRADE CHOLANGIOPANCREATOGRAPHY (ERCP) WITH PROPOFOL N/A 07/21/2020   Procedure: ENDOSCOPIC RETROGRADE CHOLANGIOPANCREATOGRAPHY (ERCP) WITH PROPOFOL;  Surgeon: Irving Copas., MD;  Location: [facility];  Service: Gastroenterology;  Laterality: N/A;   ERCP N/A 09/02/2019   Procedure: ENDOSCOPIC RETROGRADE CHOLANGIOPANCREATOGRAPHY (ERCP);  Surgeon: Irving Copas., MD;  Location: [facility];  Service: Gastroenterology;  Laterality: N/A;   EYE SURGERY     lens implant   INGUINAL HERNIA REPAIR Left 03/09/2017   Procedure: OPEN REPAIR LEFT INGUINAL HERNIA;  Surgeon: Arta Bruce Neziah Braley, MD;  Location: [facility];  Service: General;  Laterality: Left;   INSERTION OF MESH Left 03/09/2017   Procedure: INSERTION OF MESH;  Surgeon: Arta Bruce Jaylyne Breese, MD;  Location: [facility];  Service: General;  Laterality: Left;   KNEE ARTHROSCOPY     right  LYMPH NODE BIOPSY  2010   done x 2 for NHL diagnosis   MULTIPLE TOOTH EXTRACTIONS     REMOVAL OF STONES  09/02/2019   Procedure: REMOVAL OF STONES;  Surgeon: Rush Landmark Telford Nab., MD;  Location: [facility];  Service: Gastroenterology;;   REMOVAL OF STONES  07/21/2020   Procedure: REMOVAL OF STONES;  Surgeon: Irving Copas., MD;  Location: [facility];  Service: Gastroenterology;;   [name]  09/02/2019   Procedure: [name];  Surgeon: Irving Copas., MD;  Location: [facility];  Service: Gastroenterology;;   [name]  07/21/2020   Procedure:  Maricela Bo;  Surgeon: Irving Copas., MD;  Location: [facility];  Service: Gastroenterology;;   Lavell Islam REMOVAL  07/21/2020   Procedure: STENT REMOVAL;  Surgeon: Irving Copas., MD;  Location: [facility];  Service: Gastroenterology;;   TONSILLECTOMY     TOTAL HIP ARTHROPLASTY  12/13/2012   Procedure: TOTAL HIP ARTHROPLASTY ANTERIOR APPROACH;  Surgeon: Mcarthur Rossetti, MD;  Location: [facility];  Service: Orthopedics;  Laterality: Left;  Left Total Hip Arthroplasty, Anterior Approach (C-Arm)    Family History  Problem Relation Age of Onset   Heart failure Mother 24       results of chf   Leukemia Father    Colon cancer Sister    Heart disease Brother    Heart disease Brother    Heart disease Brother    Heart disease Brother    Esophageal cancer Neg Hx    Inflammatory bowel disease Neg Hx    Liver disease Neg Hx    Pancreatic cancer Neg Hx    Stomach cancer Neg Hx    Social History:  reports that he has been smoking cigarettes. He has a 55.00 pack-year smoking history. He has never used smokeless tobacco. He reports current alcohol use. He reports that he does not use drugs.  Allergies:  Allergies  Allergen Reactions   Crestor [Rosuvastatin] Other (See Comments)    Severe headache   Pred Forte [Prednisolone Acetate] Other (See Comments)    Severe burning   Atorvastatin Other (See Comments)    Shaky, dizziness, headaches   Codeine Other (See Comments)    Shakes     Medications Prior to Admission  Medication Sig Dispense Refill   acetaminophen (TYLENOL) 500 MG tablet Take 500-1,000 mg by mouth every 6 (six) hours as needed for moderate pain.     diltiazem (CARDIZEM CD) 120 MG 24 hr capsule Take 1 capsule (120 mg total) by mouth daily with breakfast. 60 capsule 1   ipratropium-albuterol (DUONEB) 0.5-2.5 (3) MG/3ML SOLN Take 3 mLs by nebulization every 6 (six) hours as needed (for wheezing/shortness of breath).     latanoprost  (XALATAN) 0.005 % ophthalmic solution Place 1 drop into both eyes at bedtime.      OXYGEN Inhale 2 L into the lungs at bedtime as needed (while sleeping).      timolol (TIMOPTIC) 0.5 % ophthalmic solution Place 1 drop into both eyes daily.     aspirin EC 81 MG tablet Take 1 tablet (81 mg total) by mouth daily. (Patient not taking: Reported on 07/09/2020) 90 tablet 3   simvastatin (ZOCOR) 10 MG tablet Take 1 tablet (10 mg total) by mouth at bedtime. (Patient not taking: Reported on 07/09/2020) 30 tablet 2    Results for orders placed or performed during the hospital encounter of 11/01/20 (from the past 48 hour(s))  Glucose, capillary     Status: Abnormal   Collection Time: 11/01/20  9:51 AM  Result Value Ref Range   Glucose-Capillary 101 (H) 70 - 99 mg/dL    Comment: Glucose reference range applies only to samples taken after fasting for at least 8 hours.   Comment 1 Notify RN    No results found.  Review of Systems  Constitutional: Negative for chills and fever.  HENT: Negative for hearing loss.   Respiratory: Negative for cough.   Cardiovascular: Negative for chest pain and palpitations.  Gastrointestinal: Negative for abdominal pain, nausea and vomiting.  Genitourinary: Negative for dysuria and urgency.  Musculoskeletal: Negative for myalgias and neck pain.  Skin: Negative for rash.  Neurological: Negative for dizziness and headaches.  Hematological: Does not bruise/bleed easily.  Psychiatric/Behavioral: Negative for suicidal ideas.    Blood pressure (!) 141/80, pulse 98, temperature 98.5 F (36.9 C), temperature source Oral, resp. rate 18, height 6\' 4"  (1.93 m), weight 108.9 kg, SpO2 100 %. Physical Exam Vitals reviewed.  Constitutional:      Appearance: He is well-developed.  HENT:     Head: Normocephalic and atraumatic.  Eyes:     Conjunctiva/sclera: Conjunctivae normal.     Pupils: Pupils are equal, round, and reactive to light.  Cardiovascular:     Rate and Rhythm:  Normal rate and regular rhythm.  Pulmonary:     Effort: Pulmonary effort is normal.     Breath sounds: Normal breath sounds.  Abdominal:     General: Bowel sounds are normal. There is no distension.     Palpations: Abdomen is soft.     Tenderness: There is no abdominal tenderness.  Musculoskeletal:        General: Normal range of motion.     Cervical back: Normal range of motion and neck supple.  Skin:    General: Skin is warm and dry.  Neurological:     Mental Status: He is alert and oriented to person, place, and time.  Psychiatric:        Behavior: Behavior normal.      Assessment/Plan 74 yo male with gallbladder disease -lap chole w IOC -ERAS procedure  Mickeal Skinner, MD 11/01/2020, 10:31 AM

## 2020-11-01 NOTE — Anesthesia Postprocedure Evaluation (Signed)
Anesthesia Post Note  Patient: [name]  Procedure(s) Performed: LAPAROSCOPIC CHOLECYSTECTOMY WITH INTRAOPERATIVE CHOLANGIOGRAM (N/A Abdomen)     Patient location during evaluation: PACU Anesthesia Type: General Level of consciousness: awake and alert Pain management: pain level controlled Vital Signs Assessment: post-procedure vital signs reviewed and stable Respiratory status: spontaneous breathing, nonlabored ventilation and respiratory function stable Cardiovascular status: blood pressure returned to baseline and stable Postop Assessment: no apparent nausea or vomiting Anesthetic complications: no   No complications documented.  Last Vitals:  Vitals:   11/01/20 1300 11/01/20 1315  BP: 127/80 120/69  Pulse: 81 71  Resp: 13 17  Temp:    SpO2: 100% 100%    Last Pain:  Vitals:   11/01/20 1315  TempSrc:   PainSc: 0-No pain                 Lidia Collum

## 2020-11-01 NOTE — Discharge Instructions (Signed)
[facility] ______[facility] °LAPAROSCOPIC SURGERY: POST OP INSTRUCTIONS °Always review your discharge instruction sheet given to you by the facility where your surgery was performed. °IF YOU HAVE DISABILITY OR FAMILY LEAVE FORMS, YOU MUST BRING THEM TO THE OFFICE FOR PROCESSING.   °DO NOT GIVE THEM TO YOUR DOCTOR. ° °1. A prescription for pain medication may be given to you upon discharge.  Take your pain medication as prescribed, if needed.  If narcotic pain medicine is not needed, then you may take acetaminophen (Tylenol) or ibuprofen (Advil) as needed. °2. Take your usually prescribed medications unless otherwise directed. °3. If you need a refill on your pain medication, please contact your pharmacy.  They will contact our office to request authorization. Prescriptions will not be filled after 5pm or on week-ends. °4. You should follow a light diet the first few days after arrival home, such as soup and crackers, etc.  Be sure to include lots of fluids daily. °5. Most patients will experience some swelling and bruising in the area of the incisions.  Ice packs will help.  Swelling and bruising can take several days to resolve.  °6. It is common to experience some constipation if taking pain medication after surgery.  Increasing fluid intake and taking a stool softener (such as Colace) will usually help or prevent this problem from occurring.  A mild laxative (Milk of Magnesia or Miralax) should be taken according to package instructions if there are no bowel movements after 48 hours. °7. Unless discharge instructions indicate otherwise, you may remove your bandages 24-48 hours after surgery, and you may shower at that time.  You may have steri-strips (small skin tapes) in place directly over the incision.  These strips should be left on the skin for 7-10 days.  If your surgeon used skin glue on the incision, you may shower in 24 hours.  The glue will flake off over the next 2-3 weeks.  Any sutures or  staples will be removed at the office during your follow-up visit. °8. ACTIVITIES:  You may resume regular (light) daily activities beginning the next day--such as daily self-care, walking, climbing stairs--gradually increasing activities as tolerated.  You may have sexual intercourse when it is comfortable.  Refrain from any heavy lifting or straining until approved by your doctor. °a. You may drive when you are no longer taking prescription pain medication, you can comfortably wear a seatbelt, and you can safely maneuver your car and apply brakes. °b. RETURN TO WORK:  __________________________________________________________ °9. You should see your doctor in the office for a follow-up appointment approximately 2-3 weeks after your surgery.  Make sure that you call for this appointment within a day or two after you arrive home to insure a convenient appointment time. °10. OTHER INSTRUCTIONS: __________________________________________________________________________________________________________________________ __________________________________________________________________________________________________________________________ °WHEN TO CALL YOUR DOCTOR: °1. Fever over 101.0 °2. Inability to urinate °3. Continued bleeding from incision. °4. Increased pain, redness, or drainage from the incision. °5. Increasing abdominal pain ° °The clinic staff is available to answer your questions during regular business hours.  Please don’t hesitate to call and ask to speak to one of the nurses for clinical concerns.  If you have a medical emergency, go to the nearest emergency room or call 911.  A surgeon from [facility] is always on call at the hospital. °[address], [address], [place], [place]  [address] ? [address] [address], [place], [place]   [address] °[phone] ? [phone] ? FAX [phone] °Web site:   [email or web address] °

## 2020-11-01 NOTE — Transfer of Care (Signed)
Immediate Anesthesia Transfer of Care Note  Patient: [name]  Procedure(s) Performed: LAPAROSCOPIC CHOLECYSTECTOMY WITH INTRAOPERATIVE CHOLANGIOGRAM (N/A Abdomen)  Patient Location: PACU  Anesthesia Type:General  Level of Consciousness: awake, alert  and oriented  Airway & Oxygen Therapy: Patient Spontanous Breathing and Patient connected to face mask oxygen  Post-op Assessment: Report given to RN and Post -op Vital signs reviewed and stable  Post vital signs: Reviewed and stable  Last Vitals:  Vitals Value Taken Time  BP    Temp    Pulse 89 11/01/20 1254  Resp 11 11/01/20 1254  SpO2 100 % 11/01/20 1254  Vitals shown include unvalidated device data.  Last Pain:  Vitals:   11/01/20 0956  TempSrc:   PainSc: 0-No pain         Complications: No complications documented.

## 2020-11-02 ENCOUNTER — Encounter (HOSPITAL_COMMUNITY): Payer: Self-pay | Admitting: General Surgery

## 2020-11-02 LAB — SURGICAL PATHOLOGY

## 2020-11-24 ENCOUNTER — Other Ambulatory Visit: Payer: Self-pay | Admitting: Family Medicine

## 2020-11-24 DIAGNOSIS — I739 Peripheral vascular disease, unspecified: Secondary | ICD-10-CM

## 2021-01-11 ENCOUNTER — Other Ambulatory Visit: Payer: Medicare Other

## 2021-01-20 ENCOUNTER — Other Ambulatory Visit: Payer: Medicare Other

## 2021-01-20 DIAGNOSIS — R509 Fever, unspecified: Secondary | ICD-10-CM | POA: Diagnosis not present

## 2021-01-20 DIAGNOSIS — M79604 Pain in right leg: Secondary | ICD-10-CM | POA: Diagnosis not present

## 2021-02-03 ENCOUNTER — Other Ambulatory Visit: Payer: Self-pay | Admitting: Family Medicine

## 2021-02-03 ENCOUNTER — Other Ambulatory Visit: Payer: Self-pay

## 2021-02-03 ENCOUNTER — Ambulatory Visit
Admission: RE | Admit: 2021-02-03 | Discharge: 2021-02-03 | Disposition: A | Discharge status: Home / Self Care | Payer: Medicare Other | Source: Ambulatory Visit | Attending: Family Medicine | Admitting: Family Medicine

## 2021-02-03 DIAGNOSIS — R030 Elevated blood-pressure reading, without diagnosis of hypertension: Secondary | ICD-10-CM | POA: Diagnosis not present

## 2021-02-03 DIAGNOSIS — I739 Peripheral vascular disease, unspecified: Secondary | ICD-10-CM

## 2021-02-03 DIAGNOSIS — G629 Polyneuropathy, unspecified: Secondary | ICD-10-CM | POA: Diagnosis not present

## 2021-02-03 DIAGNOSIS — M5442 Lumbago with sciatica, left side: Secondary | ICD-10-CM | POA: Diagnosis not present

## 2021-02-03 DIAGNOSIS — G8929 Other chronic pain: Secondary | ICD-10-CM | POA: Diagnosis not present

## 2021-02-03 DIAGNOSIS — M5441 Lumbago with sciatica, right side: Secondary | ICD-10-CM | POA: Diagnosis not present

## 2021-04-07 DIAGNOSIS — I739 Peripheral vascular disease, unspecified: Secondary | ICD-10-CM | POA: Diagnosis not present

## 2021-04-07 DIAGNOSIS — E7800 Pure hypercholesterolemia, unspecified: Secondary | ICD-10-CM | POA: Diagnosis not present

## 2021-04-07 DIAGNOSIS — I10 Essential (primary) hypertension: Secondary | ICD-10-CM | POA: Diagnosis not present

## 2021-04-07 DIAGNOSIS — H6123 Impacted cerumen, bilateral: Secondary | ICD-10-CM | POA: Diagnosis not present

## 2021-04-07 DIAGNOSIS — Z0001 Encounter for general adult medical examination with abnormal findings: Secondary | ICD-10-CM | POA: Diagnosis not present

## 2021-04-07 DIAGNOSIS — R7303 Prediabetes: Secondary | ICD-10-CM | POA: Diagnosis not present

## 2021-04-07 DIAGNOSIS — L84 Corns and callosities: Secondary | ICD-10-CM | POA: Diagnosis not present

## 2021-04-07 DIAGNOSIS — Z720 Tobacco use: Secondary | ICD-10-CM | POA: Diagnosis not present

## 2021-04-07 DIAGNOSIS — Z79899 Other long term (current) drug therapy: Secondary | ICD-10-CM | POA: Diagnosis not present

## 2021-04-12 ENCOUNTER — Other Ambulatory Visit: Payer: Self-pay | Admitting: Family Medicine

## 2021-04-12 DIAGNOSIS — Z136 Encounter for screening for cardiovascular disorders: Secondary | ICD-10-CM

## 2021-04-12 DIAGNOSIS — Z720 Tobacco use: Secondary | ICD-10-CM

## 2021-04-18 ENCOUNTER — Other Ambulatory Visit: Payer: Self-pay

## 2021-04-18 ENCOUNTER — Ambulatory Visit: Payer: Medicare Other | Admitting: Podiatry

## 2021-04-18 DIAGNOSIS — B351 Tinea unguium: Secondary | ICD-10-CM | POA: Diagnosis not present

## 2021-04-18 DIAGNOSIS — E1142 Type 2 diabetes mellitus with diabetic polyneuropathy: Secondary | ICD-10-CM

## 2021-04-18 DIAGNOSIS — M79674 Pain in right toe(s): Secondary | ICD-10-CM | POA: Diagnosis not present

## 2021-04-18 DIAGNOSIS — M79675 Pain in left toe(s): Secondary | ICD-10-CM

## 2021-04-18 DIAGNOSIS — L84 Corns and callosities: Secondary | ICD-10-CM

## 2021-04-18 NOTE — Progress Notes (Signed)
  Subjective:  Patient ID: [name], male    DOB: November 28, 1946,  MRN: [id number]  Chief Complaint  Patient presents with  . Callouses    Painful callus lesion,   (np) callus and pain, foot has PAD    75 y.o. male presents with the above complaint. History confirmed with patient. Has well-controlled diabetes mellitus with an A1c of 5.5%.  He has painful neuropathy.  He was discharged on gabapentin recently  Objective:  Physical Exam: warm, good capillary refill, no trophic changes or ulcerative lesions and normal DP and PT pulses.  Thickened elongated yellow toenails with dystrophy and subungual debris.  Hyperkeratosis preulcerative calluses about 1 and 5 bilaterally. Assessment:  No diagnosis found.   Plan:  Patient was evaluated and treated and all questions answered.  Patient educated on diabetes. Discussed proper diabetic foot care and discussed risks and complications of disease. Educated patient in depth on reasons to return to the office immediately should he/she discover anything concerning or new on the feet. All questions answered. Discussed proper shoes as well.   Discussed the etiology and treatment options for the condition in detail with the patient. Educated patient on the topical and oral treatment options for mycotic nails. Recommended debridement of the nails today. Sharp and mechanical debridement performed of all painful and mycotic nails today. Nails debrided in length and thickness using a nail nipper to level of comfort. Discussed treatment options including appropriate shoe gear. Follow up as needed for painful nails.  All symptomatic hyperkeratoses were safely debrided with a sterile #15 blade to patient's level of comfort without incident. We discussed preventative and palliative care of these lesions including supportive and accommodative shoegear, padding, prefabricated and custom molded accommodative orthoses, use of a pumice stone and lotions/creams  daily.    Return in about 3 months (around 07/18/2021), or if symptoms worsen or fail to improve, for at risk diabetic foot care.

## 2021-04-20 ENCOUNTER — Telehealth: Payer: Self-pay

## 2021-04-20 ENCOUNTER — Other Ambulatory Visit: Payer: Self-pay

## 2021-04-20 DIAGNOSIS — Z961 Presence of intraocular lens: Secondary | ICD-10-CM | POA: Diagnosis not present

## 2021-04-20 DIAGNOSIS — H04123 Dry eye syndrome of bilateral lacrimal glands: Secondary | ICD-10-CM | POA: Diagnosis not present

## 2021-04-20 DIAGNOSIS — E119 Type 2 diabetes mellitus without complications: Secondary | ICD-10-CM | POA: Diagnosis not present

## 2021-04-20 DIAGNOSIS — H401111 Primary open-angle glaucoma, right eye, mild stage: Secondary | ICD-10-CM | POA: Diagnosis not present

## 2021-04-20 DIAGNOSIS — H0102A Squamous blepharitis right eye, upper and lower eyelids: Secondary | ICD-10-CM | POA: Diagnosis not present

## 2021-04-20 DIAGNOSIS — Z9889 Other specified postprocedural states: Secondary | ICD-10-CM

## 2021-04-20 DIAGNOSIS — H0102B Squamous blepharitis left eye, upper and lower eyelids: Secondary | ICD-10-CM | POA: Diagnosis not present

## 2021-04-20 DIAGNOSIS — H401123 Primary open-angle glaucoma, left eye, severe stage: Secondary | ICD-10-CM | POA: Diagnosis not present

## 2021-04-20 NOTE — Telephone Encounter (Signed)
ERCP scheduled for 05/26/21 at [facility] at 830 am with Dr Rush Landmark.  COVID test on 05/24/21 at 1040 am  ERCP scheduled, pt instructed and medications reviewed.  Patient instructions mailed to home.  Patient to call with any questions or concerns.

## 2021-04-20 NOTE — Telephone Encounter (Signed)
-----   Message from Irving Copas., MD sent at 04/19/2021  5:02 PM EDT ----- Regarding: Follow-up procedure Meriem Lemieux, Please call and get this patient scheduled for ERCP as it has been a 9 months since his procedure and his bile duct stent needs to be removed. Let me know when he is scheduled for. Thanks. GM

## 2021-05-05 ENCOUNTER — Other Ambulatory Visit: Payer: Self-pay

## 2021-05-05 ENCOUNTER — Encounter (INDEPENDENT_AMBULATORY_CARE_PROVIDER_SITE_OTHER): Payer: Self-pay | Admitting: Otolaryngology

## 2021-05-05 ENCOUNTER — Ambulatory Visit (INDEPENDENT_AMBULATORY_CARE_PROVIDER_SITE_OTHER): Payer: Medicare Other | Admitting: Otolaryngology

## 2021-05-05 VITALS — Temp 97.2°F

## 2021-05-05 DIAGNOSIS — H903 Sensorineural hearing loss, bilateral: Secondary | ICD-10-CM | POA: Diagnosis not present

## 2021-05-05 DIAGNOSIS — H6123 Impacted cerumen, bilateral: Secondary | ICD-10-CM | POA: Diagnosis not present

## 2021-05-05 NOTE — Progress Notes (Signed)
HPI: [name] is a 75 y.o. male who presents is referred by his PCP for evaluation of bilateral cerumen impactions that were unable to be washed out by his PCP.  He also has chronic problems with imbalance and dizziness.  Does not really describe true vertigo or spinning.  He does have decreased hearing in both ears..  Past Medical History:  Diagnosis Date  . Atrial fibrillation ([place])   . Avascular necrosis of bones of both hips ([place])   . 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 Cancer ([place])   . Choledocholithiasis    HX  . Colonic polyp   . Complication of anesthesia    hard to wake up, "felt like died and revived me"  . COPD (chronic obstructive pulmonary disease) (H[facility])    mild PFT abnormalities; normal ABG  . Degenerative disc disease, lumbar    HNP of the LS spine  . Diabetes mellitus    type 2  . Dysrhythmia    A-fib  . Full dentures   . GERD (gastroesophageal reflux disease)   . Glaucoma   . H/O hiatal hernia   . Headache   . Hx MRSA infection 2010   sith splenic abcess  . Non Hodgkin's lymphoma ([place]) 06/2009   chemotherapy until 11/2010  . Obesity   . Obstructive sleep apnea    treated with CPAP and oxygen  . On supplemental oxygen therapy    wears at HS and PRN  . Orthostatic hypotension    lightheadness; no definate loss of consciousness  . Pedal edema   . Permanent atrial fibrillation ([place]) 01/10/2011   Low thromboembolic risk   . Pneumonia   . PONV (postoperative nausea and vomiting)   . Splenic abscess 07/2009  . Tobacco abuse    45 pack years   Past Surgical History:  Procedure Laterality Date  . ABSCESS DRAINAGE  2010   Splenic  . ANAL FISSURE 39  75years old  . BILIARY DILATION  09/02/2019   Procedure: BILIARY DILATION;  Surgeon: Rush Landmark Telford Nab., MD;  Location: [facility];  Service: Gastroenterology;;  . BILIARY STENT PLACEMENT N/A 09/02/2019   Procedure: BILIARY STENT PLACEMENT;  Surgeon: Irving Copas., MD;  Location: [facility];  Service: Gastroenterology;  Laterality: N/A;  . BILIARY STENT PLACEMENT N/A 07/21/2020   Procedure: BILIARY STENT PLACEMENT;  Surgeon: Rush Landmark Telford Nab., MD;  [facility];  Service: Gastroenterology;  Laterality: N/A;  . BIOPSY  09/02/2019   Procedure: BIOPSY;  Surgeon: Rush Landmark Telford Nab., MD;  [facility];  Service: Gastroenterology;;  . CATARACT EXTRACTION     Right with lens implant  . CHOLECYSTECTOMY N/A 11/01/2020   Procedure: LAPAROSCOPIC CHOLECYSTECTOMY WITH INTRAOPERATIVE CHOLANGIOGRAM;  Surgeon: Kieth Brightly Arta Bruce, MD;  [facility]: [facility];  Service: General;  Laterality: N/A;  . COLONOSCOPY W/ POLYPECTOMY  2008   with snare polypectomy  . ENDOSCOPIC RETROGRADE CHOLANGIOPANCREATOGRAPHY (ERCP) WITH PROPOFOL N/A 07/21/2020   Procedure: ENDOSCOPIC RETROGRADE CHOLANGIOPANCREATOGRAPHY (ERCP) WITH PROPOFOL;  Surgeon: Rush Landmark Telford Nab., MD;  [facility];  Service: Gastroenterology;  Laterality: N/A;  . ERCP N/A 09/02/2019   Procedure: ENDOSCOPIC RETROGRADE CHOLANGIOPANCREATOGRAPHY (ERCP);  Surgeon: Irving Copas., MD;  Location: [facility];  Service: Gastroenterology;  Laterality: N/A;  . EYE SURGERY     lens implant  . INGUINAL HERNIA REPAIR Left 03/09/2017   Procedure: OPEN REPAIR LEFT INGUINAL HERNIA;  Surgeon: Arta Bruce Kinsinger, MD;  [facility]: [facility];  Service: General;  Laterality:  Left;  . INSERTION OF MESH Left 03/09/2017   Procedure: INSERTION OF MESH;  Surgeon: Arta Bruce Kinsinger, MD;  [facility];  Service: General;  Laterality: Left;  . KNEE ARTHROSCOPY     right  . LYMPH NODE BIOPSY  2010   done x 2 for NHL diagnosis  . MULTIPLE TOOTH EXTRACTIONS    . REMOVAL OF STONES  09/02/2019   Procedure: REMOVAL OF STONES;  Surgeon: Rush Landmark Telford Nab., MD;  Location: [facility];  Service: Gastroenterology;;  . REMOVAL OF STONES  07/21/2020   Procedure: REMOVAL OF STONES;  Surgeon: Irving Copas.,  MD;  Location: [facility];  Service: Gastroenterology;;  . [name]  09/02/2019   Procedure: [name];  Surgeon: Irving Copas., MD;  Location: [facility];  Service: Gastroenterology;;  . [name]  07/21/2020   Procedure: Maricela Bo;  Surgeon: Irving Copas., MD;  Location: [facility];  Service: Gastroenterology;;  . Lavell Islam REMOVAL  07/21/2020   Procedure: STENT REMOVAL;  Surgeon: Irving Copas., MD;  [facility];  Service: Gastroenterology;;  . TONSILLECTOMY    . TOTAL HIP ARTHROPLASTY  12/13/2012   Procedure: TOTAL HIP ARTHROPLASTY ANTERIOR APPROACH;  Surgeon: Mcarthur Rossetti, MD;  [facility];  Service: Orthopedics;  Laterality: Left;  Left Total Hip Arthroplasty, Anterior Approach (C-Arm)   Social History   Socioeconomic History  . Marital status: Divorced    Spouse name: Not on file  . Number of children: 2  . Years of education: Not on file  . Highest education level: Not on file  Occupational History  . Occupation: retired/disability  Tobacco Use  . Smoking status: Current Every Day Smoker    Packs/day: 1.00    Years: 56.00    Pack years: 56.00    Types: Cigarettes    Start date: 29  . Smokeless tobacco: Never Used  Vaping Use  . Vaping Use: Never used  Substance and Sexual Activity  . Alcohol use: Yes    Comment: social.  . Drug use: No  . Sexual activity: Not on file  Other Topics Concern  . Not on file  Social History Narrative  . Not on file   Social Determinants of Health   Financial Resource Strain: Not on file  Food Insecurity: Not on file  Transportation Needs: Not on file  Physical Activity: Not on file  Stress: Not on file  Social Connections: Not on file   Family History  Problem Relation Age of Onset  . Heart failure Mother 88       results of chf  . Leukemia Father   . Colon cancer Sister   . Heart disease Brother   . Heart disease Brother   . Heart disease Brother    . Heart disease Brother   . Esophageal cancer Neg Hx   . Inflammatory bowel disease Neg Hx   . Liver disease Neg Hx   . Pancreatic cancer Neg Hx   . Stomach cancer Neg Hx    Allergies  Allergen Reactions  . Crestor [Rosuvastatin] Other (See Comments)    Severe headache  . Pred Forte [Prednisolone Acetate] Other (See Comments)    Severe burning  . Atorvastatin Other (See Comments)    Shaky, dizziness, headaches  . Codeine Other (See Comments)    Shakes    Prior to Admission medications   Medication Sig Start Date End Date Taking? Authorizing Provider  acetaminophen (TYLENOL) 500 MG tablet Take 500-1,000 mg by mouth every 6 (six)  hours as needed for moderate pain.    [provider]  diltiazem (CARDIZEM CD) 120 MG 24 hr capsule Take 1 capsule (120 mg total) by mouth daily with breakfast. 09/07/19   Georgette Shell, MD  HYDROcodone-acetaminophen (NORCO/VICODIN) 5-325 MG tablet Take 1 tablet by mouth every 6 (six) hours as needed for moderate pain. 11/01/20   Kinsinger, Arta Bruce, MD  ipratropium-albuterol (DUONEB) 0.5-2.5 (3) MG/3ML SOLN Take 3 mLs by nebulization every 6 (six) hours as needed (for wheezing/shortness of breath).    [provider]  latanoprost (XALATAN) 0.005 % ophthalmic solution Place 1 drop into both eyes at bedtime.  12/04/16   [provider]  OXYGEN Inhale 2 L into the lungs at bedtime as needed (while sleeping).     [provider]  timolol (TIMOPTIC) 0.5 % ophthalmic solution Place 1 drop into both eyes daily. 01/12/17   [provider]     Positive ROS: Otherwise negative  All other systems have been reviewed and were otherwise negative with the exception of those mentioned in the HPI and as above.  Physical Exam: Constitutional: Alert, well-appearing, no acute distress Ears: External ears without lesions or tenderness. Ear canals are completely obstructed with hard wax in both ear canals that was cleaned  with curette and forceps.  After cleaning the ear canals both TMs were clear with good mobility on pneumatic otoscopy.  Hearing screening with the 512 1024 tuning fork revealed mild-moderate bilateral sensorineural hearing loss with AC > BC bilaterally.  On Dix-Hallpike testing he had no clinical evidence of BPPV. Nasal: External nose without lesions. Septum with minimal deformity. Clear nasal passages otherwise. Oral: Lips and gums without lesions. Tongue and palate mucosa without lesions. Posterior oropharynx clear. Neck: No palpable adenopathy or masses Respiratory: Breathing comfortably  Skin: No facial/neck lesions or rash noted.  Cerumen impaction removal  Date/Time: 05/05/2021 5:31 PM Performed by: Rozetta Nunnery, MD Authorized by: Rozetta Nunnery, MD   Consent:    Consent obtained:  Verbal   Consent given by:  Patient   Risks discussed:  Pain and bleeding Procedure details:    Location:  L ear and R ear   Procedure type: curette and forceps   Post-procedure details:    Inspection:  TM intact and canal normal   Hearing quality:  Improved   Patient tolerance of procedure:  Tolerated well, no immediate complications Comments:     TMs are thickened but clear bilaterally.    Assessment: Bilateral cerumen impactions that was cleaned in the office.  TMs are clear bilaterally.  Plan: He will follow-up as needed.   Radene Journey, MD   CC:

## 2021-05-09 ENCOUNTER — Encounter: Payer: Medicare Other | Admitting: Vascular Surgery

## 2021-05-24 ENCOUNTER — Other Ambulatory Visit (HOSPITAL_COMMUNITY)
Admission: RE | Admit: 2021-05-24 | Discharge: 2021-05-24 | Disposition: A | Discharge status: Home / Self Care | Payer: Medicare Other | Source: Ambulatory Visit | Attending: Gastroenterology | Admitting: Gastroenterology

## 2021-05-24 DIAGNOSIS — Z20822 Contact with and (suspected) exposure to covid-19: Secondary | ICD-10-CM | POA: Diagnosis not present

## 2021-05-24 DIAGNOSIS — Z01812 Encounter for preprocedural laboratory examination: Secondary | ICD-10-CM | POA: Diagnosis not present

## 2021-05-24 LAB — SARS CORONAVIRUS 2 (TAT 6-24 HRS): SARS Coronavirus 2: NEGATIVE

## 2021-05-25 ENCOUNTER — Other Ambulatory Visit: Payer: Self-pay

## 2021-05-25 ENCOUNTER — Encounter (HOSPITAL_COMMUNITY): Payer: Self-pay | Admitting: Gastroenterology

## 2021-05-25 NOTE — Progress Notes (Signed)
Anesthesia Chart Review:  Pt is a same day work up   Case: [id number] Date/Time: 05/26/21 0845   Procedure: ENDOSCOPIC RETROGRADE CHOLANGIOPANCREATOGRAPHY (ERCP) WITH PROPOFOL (N/A )   Anesthesia type: General   Pre-op diagnosis: bile duct stent removal   Location: [facility] ROOM [facility] / [facility]   Surgeons: Irving Copas., MD      DISCUSSION:  75 y.o. current every day smoker (55 pack years) with h/o PONV, COPD (2L O2 prn), GERD, OSA on CPAP (nocturnal O2 use), A-fib (no anticoagulation per pt request, per Dr. Einar Gip, "He is aware of the risk of stroke, in view of underlying peripheral arterial disease and probably underlying coronary artery disease, I have recommended that he start 81 mg aspirin which will not be a contraindication for his surgery or procedures."), DM II, non Hodgkin's Lymphoma (diagnosed 08/20/2009 via splenic biopsy and pancreatic tall mass biopsy; s/p 7 cycles R-CHOP chemotherapy), choledocholithiasis   Per anesthesia chart review 06/10/2020, "For anesthesia history, it was documented on 12/09/2012 that he reported "hard to wake up,'felt like died and revived me'". He has since undergone left THA 12/13/2012 and open left inguinal hernia repair 03/09/2017."  Normal myocardial perfusion 05/31/2020, EF 34% (possibly due to calculation error from afib; 55-60% 09/01/2019 by echo), clinically low risk per Dr. Einar Gip.  Review PAT note 06/10/2020 by Myra Gianotti, PA-C for more history.   Anticipate pt can proceed with planned procedure barring acute status change and after evaluation DOS.   VS: BP (!) 155/77   Pulse 91   Temp 36.8 C (Oral)   Resp 18   Ht 6\' 4"  (1.93 m)   Wt 108.9 kg   SpO2 100%   BMI 29.21 kg/m   PROVIDERS: PCP is Lujean Amel, MD Adrian Prows, MD is Cardiologist - last office visit 05/28/20   LABS: Will be obtained day of surgery if needed.   EKG 05/28/2020: Atrial fibrillation with controlled ventricular sponsor rate of 84 bpm, normal axis.   Poor R wave progression, probably normal variant.  No evidence of ischemia.  Low-voltage complexes.  Consider pulmonary disease pattern. ABNORMAL RHYTHM  CV: Lexiscan (Walking with mod Bruce)Tetrofosmin Stress Test 06/01/2020: Nondiagnostic ECG stress. Underlying A. Fibrillation and occasional PVC.  Myocardial perfusion is normal. Stress LV EF: 34% with global hypokinesis. The calculated LVEF may be an error due to difficulty in gating from A. Fibrillation. Compared to the study done on 09/04/2019, inferior ischemia not present and EF 50%. This is a low to intermediate risk study.    Echo 09/01/2019 IMPRESSIONS  1. The left ventricle has low normal systolic function, with an ejection  fraction of 55-60%. The cavity size was normal. There is moderately  increased left ventricular wall thickness. Left ventricular diastolic  function could not be evaluated secondary  to atrial fibrillation. Inadequat for regional wall motion assessment.  2. The right ventricle has normal systolic function. The cavity was  normal. There is no increase in right ventricular wall thickness.  3. Moderate thickening of the mitral valve leaflet. Mild calcification of  the mitral valve leaflet. There is moderate mitral annular calcification  present.  4. The aortic valve is tricuspid. Mild sclerosis of the aortic valve.  Aortic valve regurgitation was not assessed by color flow Doppler.  5. The aorta is normal unless otherwise noted.  6. The interatrial septum appears to be lipomatous.  7. Trivial pericardial effusion is present.  8. The pericardial effusion is circumferential.   Past Medical History:  Diagnosis  Date  . Atrial fibrillation ([place])   . Avascular necrosis of bones of both hips ([place])   . 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 Cancer ([place])   . Choledocholithiasis    HX  . Colonic polyp   . Complication of anesthesia    hard to wake up, "felt like died and revived me" - just one time  with knee surgery, no problems since  . COPD (chronic obstructive pulmonary disease) (H[facility])    mild PFT abnormalities; normal ABG, no inhalers   . Degenerative disc disease, lumbar    HNP of the LS spine  . Diabetes mellitus    type 2 - no meds   . Dysrhythmia    A-fib  . Full dentures   . GERD (gastroesophageal reflux disease)   . Glaucoma   . H/O hiatal hernia   . Headache   . Hx MRSA infection 2010   sith splenic abcess  . 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 Non Hodgkin's lymphoma ([place]) 06/2009   chemotherapy until 11/2010  . Obesity   . Obstructive sleep apnea    does not use cpap, occasional uses oxygen qhs  . On supplemental oxygen therapy    wears at HS and PRN - 2L  . Orthostatic hypotension    lightheadness; no definate loss of consciousness  . Parkinson's disease (tremor, stiffness, slow motion, unstable posture) ([place])   . Pedal edema   . Permanent atrial fibrillation ([place]) 01/10/2011   Low thromboembolic risk   . Pneumonia   . Splenic abscess 07/2009  . Tobacco abuse    45 pack years    Past Surgical History:  Procedure Laterality Date  . ABSCESS DRAINAGE  2010   Splenic  . ANAL FISSURE 48  75years old  . BILIARY DILATION  09/02/2019   Procedure: BILIARY DILATION;  Surgeon: Rush Landmark Telford Nab., MD;  Location: [facility];  Service: Gastroenterology;;  . BILIARY STENT PLACEMENT N/A 09/02/2019   Procedure: BILIARY STENT PLACEMENT;  Surgeon: Irving Copas., MD;  Location: [facility];  Service: Gastroenterology;  Laterality: N/A;  . BILIARY STENT PLACEMENT N/A 07/21/2020   Procedure: BILIARY STENT PLACEMENT;  Surgeon: Rush Landmark Telford Nab., MD;  [facility];  Service: Gastroenterology;  Laterality: N/A;  . BIOPSY  09/02/2019   Procedure: BIOPSY;  Surgeon: Rush Landmark Telford Nab., MD;  [facility];  Service: Gastroenterology;;  . CATARACT EXTRACTION     Right with lens implant  . CHOLECYSTECTOMY N/A  11/01/2020   Procedure: LAPAROSCOPIC CHOLECYSTECTOMY WITH INTRAOPERATIVE CHOLANGIOGRAM;  Surgeon: Kieth Brightly Arta Bruce, MD;  [facility];  Service: General;  Laterality: N/A;  . COLONOSCOPY W/ POLYPECTOMY  2008   with snare polypectomy  . ENDOSCOPIC RETROGRADE CHOLANGIOPANCREATOGRAPHY (ERCP) WITH PROPOFOL N/A 07/21/2020   Procedure: ENDOSCOPIC RETROGRADE CHOLANGIOPANCREATOGRAPHY (ERCP) WITH PROPOFOL;  Surgeon: Rush Landmark Telford Nab., MD;  [facility];  Service: Gastroenterology;  Laterality: N/A;  . ERCP N/A 09/02/2019   Procedure: ENDOSCOPIC RETROGRADE CHOLANGIOPANCREATOGRAPHY (ERCP);  Surgeon: Irving Copas., MD;  Location: [facility];  Service: Gastroenterology;  Laterality: N/A;  . EYE SURGERY     lens implant  . INGUINAL HERNIA REPAIR Left 03/09/2017   Procedure: OPEN REPAIR LEFT INGUINAL HERNIA;  Surgeon: Arta Bruce Kinsinger, MD;  [facility];  Service: General;  Laterality: Left;  . INSERTION OF MESH Left 03/09/2017   Procedure: INSERTION OF MESH;  Surgeon: Arta Bruce Kinsinger, MD;  [facility];  Service:  General;  Laterality: Left;  . KNEE ARTHROSCOPY     right  . LYMPH NODE BIOPSY  2010   done x 2 for NHL diagnosis  . MULTIPLE TOOTH EXTRACTIONS    . REMOVAL OF STONES  09/02/2019   Procedure: REMOVAL OF STONES;  Surgeon: Rush Landmark Telford Nab., MD;  Location: [facility];  Service: Gastroenterology;;  . REMOVAL OF STONES  07/21/2020   Procedure: REMOVAL OF STONES;  Surgeon: Irving Copas., MD;  Location: [facility];  Service: Gastroenterology;;  . [name]  09/02/2019   Procedure: [name];  Surgeon: Irving Copas., MD;  Location: [facility];  Service: Gastroenterology;;  . [name]  07/21/2020   Procedure: Maricela Bo;  Surgeon: Irving Copas., MD;  Location: [facility];  Service: Gastroenterology;;  . Lavell Islam REMOVAL  07/21/2020   Procedure: STENT REMOVAL;  Surgeon: Irving Copas., MD;   [facility];  Service: Gastroenterology;;  . TONSILLECTOMY    . TOTAL HIP ARTHROPLASTY  12/13/2012   Procedure: TOTAL HIP ARTHROPLASTY ANTERIOR APPROACH;  Surgeon: Mcarthur Rossetti, MD;  [facility];  Service: Orthopedics;  Laterality: Left;  Left Total Hip Arthroplasty, Anterior Approach (C-Arm)    MEDICATIONS: No current facility-administered medications for this encounter.   . diltiazem (CARDIZEM CD) 120 MG 24 hr capsule  . diphenhydramine-acetaminophen (TYLENOL PM) 25-500 MG TABS tablet  . gabapentin (NEURONTIN) 300 MG capsule  . ipratropium-albuterol (DUONEB) 0.5-2.5 (3) MG/3ML SOLN  . latanoprost (XALATAN) 0.005 % ophthalmic solution  . OXYGEN  . timolol (TIMOPTIC) 0.5 % ophthalmic solution   . heparin lock flush 100 unit/mL  . sodium chloride 0.9 % injection 10 mL    If no changes, I anticipate pt can proceed with surgery as scheduled.   Willeen Cass, PhD, FNP-BC [facility] Stay [facility]/Anesthesiology Phone: [phone] 05/25/2021 4:37 PM

## 2021-05-25 NOTE — Progress Notes (Signed)
DUE TO COVID-19 ONLY ONE VISITOR IS ALLOWED TO COME WITH YOU AND STAY IN THE WAITING ROOM ONLY DURING PRE OP AND PROCEDURE DAY OF SURGERY.   PCP - Dr Lauretta Grill [place] Cardiologist - Dr Adrian Prows  Chest x-ray - n/a EKG - 05/28/20 Stress Test - 06/01/20 ECHO - 09/04/19 Cardiac Cath - n/a  Sleep Study -  Yes CPAP - does not use cpap Uses oxygen 2 L qhs and prn  Fasting Blood Sugar - unknown Checks Blood Sugar 0 times a day DM, Type 2, no meds, diet controlled.    Anesthesia review: Yes  STOP now taking any Aspirin (unless otherwise instructed by your surgeon), Aleve, Naproxen, Ibuprofen, Motrin, Advil, Goody's, BC's, all herbal medications, fish oil, and all vitamins.   Coronavirus Screening Covid test on 05/24/21 was negative.  Patient verbalized understanding of instructions that were given via phone.

## 2021-05-25 NOTE — Anesthesia Preprocedure Evaluation (Addendum)
Anesthesia Evaluation  Patient identified by MRN, date of birth, ID band Patient awake    Reviewed: Allergy & Precautions, NPO status , Patient's Chart, lab work & pertinent test results  Airway Mallampati: I  TM Distance: >3 FB Neck ROM: Full    Dental  (+) Edentulous Lower, Edentulous Upper   Pulmonary sleep apnea , COPD,  COPD inhaler and oxygen dependent, Current SmokerPatient did not abstain from smoking.,    Pulmonary exam normal breath sounds clear to auscultation       Cardiovascular Normal cardiovascular exam+ dysrhythmias Atrial Fibrillation  Rhythm:Regular Rate:Normal  ECG: a-fib, rate 84  Myocardial perfusion: Lexiscan (Walking with mod Bruce)Tetrofosmin Stress Test 06/01/2020: Nondiagnostic ECG stress. Underlying A. Fibrillation and occasional PVC.  Myocardial perfusion is normal. Stress LV EF: 34% with global hypokinesis. The calculated LVEF may be an  error due to difficulty in gating from A. Fibrillation. Compared to the study done on 09/04/2019, inferior ischemia not present  and EF 50%. This is a low to intermediate risk study.     Neuro/Psych  Headaches, Parkinson's disease (tremor, stiffness, slow motion, unstable posture)   Neuromuscular disease negative psych ROS   GI/Hepatic Neg liver ROS, hiatal hernia, GERD  Controlled,  Endo/Other  diabetes  Renal/GU negative Renal ROS     Musculoskeletal negative musculoskeletal ROS (+)   Abdominal   Peds  Hematology negative hematology ROS (+)   Anesthesia Other Findings Bile duct stent removal  Reproductive/Obstetrics                           Anesthesia Physical Anesthesia Plan  ASA: IV  Anesthesia Plan: General   Post-op Pain Management:    Induction: Intravenous  PONV Risk Score and Plan: 1 and Ondansetron, Dexamethasone and Treatment may vary due to age or medical condition  Airway Management Planned: Oral  ETT  Additional Equipment:   Intra-op Plan:   Post-operative Plan: Extubation in OR  Informed Consent: I have reviewed the patients History and Physical, chart, labs and discussed the procedure including the risks, benefits and alternatives for the proposed anesthesia with the patient or authorized representative who has indicated his/her understanding and acceptance.     Dental advisory given  Plan Discussed with: CRNA  Anesthesia Plan Comments: (Reviewed APP note by Durel Salts, FNP )      Anesthesia Quick Evaluation

## 2021-05-26 ENCOUNTER — Encounter (HOSPITAL_COMMUNITY)
Admission: RE | Disposition: A | Discharge status: Home / Self Care | Payer: Self-pay | Source: Home / Self Care | Attending: Gastroenterology

## 2021-05-26 ENCOUNTER — Ambulatory Visit (HOSPITAL_COMMUNITY)
Admission: RE | Admit: 2021-05-26 | Discharge: 2021-05-26 | Disposition: A | Discharge status: Home / Self Care | Payer: Medicare Other | Attending: Gastroenterology | Admitting: Gastroenterology

## 2021-05-26 ENCOUNTER — Ambulatory Visit (HOSPITAL_COMMUNITY): Payer: Medicare Other | Admitting: Emergency Medicine

## 2021-05-26 ENCOUNTER — Other Ambulatory Visit: Payer: Self-pay

## 2021-05-26 ENCOUNTER — Encounter (HOSPITAL_COMMUNITY): Payer: Self-pay | Admitting: Gastroenterology

## 2021-05-26 ENCOUNTER — Ambulatory Visit (HOSPITAL_COMMUNITY): Payer: Medicare Other

## 2021-05-26 DIAGNOSIS — Z9889 Other specified postprocedural states: Secondary | ICD-10-CM | POA: Diagnosis not present

## 2021-05-26 DIAGNOSIS — K8050 Calculus of bile duct without cholangitis or cholecystitis without obstruction: Secondary | ICD-10-CM

## 2021-05-26 DIAGNOSIS — I313 Pericardial effusion (noninflammatory): Secondary | ICD-10-CM | POA: Insufficient documentation

## 2021-05-26 DIAGNOSIS — G4733 Obstructive sleep apnea (adult) (pediatric): Secondary | ICD-10-CM | POA: Diagnosis not present

## 2021-05-26 DIAGNOSIS — Z419 Encounter for procedure for purposes other than remedying health state, unspecified: Secondary | ICD-10-CM

## 2021-05-26 DIAGNOSIS — I4821 Permanent atrial fibrillation: Secondary | ICD-10-CM | POA: Diagnosis not present

## 2021-05-26 DIAGNOSIS — K3189 Other diseases of stomach and duodenum: Secondary | ICD-10-CM

## 2021-05-26 DIAGNOSIS — Z9221 Personal history of antineoplastic chemotherapy: Secondary | ICD-10-CM | POA: Insufficient documentation

## 2021-05-26 DIAGNOSIS — Z8572 Personal history of non-Hodgkin lymphomas: Secondary | ICD-10-CM | POA: Diagnosis not present

## 2021-05-26 DIAGNOSIS — I351 Nonrheumatic aortic (valve) insufficiency: Secondary | ICD-10-CM | POA: Insufficient documentation

## 2021-05-26 DIAGNOSIS — Z885 Allergy status to narcotic agent status: Secondary | ICD-10-CM | POA: Insufficient documentation

## 2021-05-26 DIAGNOSIS — Z4659 Encounter for fitting and adjustment of other gastrointestinal appliance and device: Secondary | ICD-10-CM

## 2021-05-26 DIAGNOSIS — E1151 Type 2 diabetes mellitus with diabetic peripheral angiopathy without gangrene: Secondary | ICD-10-CM | POA: Diagnosis not present

## 2021-05-26 DIAGNOSIS — F17210 Nicotine dependence, cigarettes, uncomplicated: Secondary | ICD-10-CM | POA: Insufficient documentation

## 2021-05-26 DIAGNOSIS — K219 Gastro-esophageal reflux disease without esophagitis: Secondary | ICD-10-CM | POA: Diagnosis not present

## 2021-05-26 DIAGNOSIS — J449 Chronic obstructive pulmonary disease, unspecified: Secondary | ICD-10-CM | POA: Insufficient documentation

## 2021-05-26 DIAGNOSIS — Z8719 Personal history of other diseases of the digestive system: Secondary | ICD-10-CM | POA: Diagnosis not present

## 2021-05-26 DIAGNOSIS — Z888 Allergy status to other drugs, medicaments and biological substances status: Secondary | ICD-10-CM | POA: Insufficient documentation

## 2021-05-26 DIAGNOSIS — Z9049 Acquired absence of other specified parts of digestive tract: Secondary | ICD-10-CM | POA: Insufficient documentation

## 2021-05-26 DIAGNOSIS — Z48815 Encounter for surgical aftercare following surgery on the digestive system: Secondary | ICD-10-CM | POA: Diagnosis not present

## 2021-05-26 DIAGNOSIS — K8071 Calculus of gallbladder and bile duct without cholecystitis with obstruction: Secondary | ICD-10-CM | POA: Diagnosis not present

## 2021-05-26 HISTORY — DX: Myoneural disorder, unspecified: G70.9

## 2021-05-26 HISTORY — DX: Parkinson's disease: G20

## 2021-05-26 HISTORY — DX: Parkinson's disease without dyskinesia, without mention of fluctuations: G20.A1

## 2021-05-26 HISTORY — PX: STENT REMOVAL: SHX6421

## 2021-05-26 HISTORY — PX: REMOVAL OF STONES: SHX5545

## 2021-05-26 HISTORY — PX: ENDOSCOPIC RETROGRADE CHOLANGIOPANCREATOGRAPHY (ERCP) WITH PROPOFOL: SHX5810

## 2021-05-26 LAB — GLUCOSE, CAPILLARY: Glucose-Capillary: 147 mg/dL — ABNORMAL HIGH (ref 70–99)

## 2021-05-26 SURGERY — ENDOSCOPIC RETROGRADE CHOLANGIOPANCREATOGRAPHY (ERCP) WITH PROPOFOL
Anesthesia: General

## 2021-05-26 MED ORDER — INDOMETHACIN 50 MG RE SUPP
RECTAL | Status: DC | PRN
  Administered 2021-05-26: 100 mg via RECTAL

## 2021-05-26 MED ORDER — ONDANSETRON HCL 4 MG/2ML IJ SOLN: 4.0000 mg | Freq: Once | INTRAMUSCULAR | Status: DC | PRN

## 2021-05-26 MED ORDER — SODIUM CHLORIDE 0.9 % IV SOLN: INTRAVENOUS | Status: DC

## 2021-05-26 MED ORDER — FENTANYL CITRATE (PF) 100 MCG/2ML IJ SOLN: 25.0000 ug | INTRAMUSCULAR | Status: DC | PRN

## 2021-05-26 MED ORDER — SUGAMMADEX SODIUM 200 MG/2ML IV SOLN
INTRAVENOUS | Status: DC | PRN
  Administered 2021-05-26: 300 mg via INTRAVENOUS

## 2021-05-26 MED ORDER — ONDANSETRON HCL 4 MG/2ML IJ SOLN
INTRAMUSCULAR | Status: DC | PRN
  Administered 2021-05-26: 4 mg via INTRAVENOUS

## 2021-05-26 MED ORDER — ACETAMINOPHEN 10 MG/ML IV SOLN: 1000.0000 mg | Freq: Once | INTRAVENOUS | Status: DC | PRN

## 2021-05-26 MED ORDER — LIDOCAINE 2% (20 MG/ML) 5 ML SYRINGE
INTRAMUSCULAR | Status: DC | PRN
  Administered 2021-05-26: 60 mg via INTRAVENOUS

## 2021-05-26 MED ORDER — PROPOFOL 10 MG/ML IV BOLUS
INTRAVENOUS | Status: DC | PRN
  Administered 2021-05-26: 150 mg via INTRAVENOUS

## 2021-05-26 MED ORDER — CIPROFLOXACIN IN D5W 400 MG/200ML IV SOLN
INTRAVENOUS | Status: AC
  Filled 2021-05-26: qty 200

## 2021-05-26 MED ORDER — ROCURONIUM BROMIDE 10 MG/ML (PF) SYRINGE
PREFILLED_SYRINGE | INTRAVENOUS | Status: DC | PRN
  Administered 2021-05-26: 60 mg via INTRAVENOUS

## 2021-05-26 MED ORDER — FENTANYL CITRATE (PF) 250 MCG/5ML IJ SOLN
INTRAMUSCULAR | Status: DC | PRN
  Administered 2021-05-26 (×2): 50 ug via INTRAVENOUS

## 2021-05-26 MED ORDER — GLUCAGON HCL RDNA (DIAGNOSTIC) 1 MG IJ SOLR
INTRAMUSCULAR | Status: AC
  Filled 2021-05-26: qty 1

## 2021-05-26 MED ORDER — INDOMETHACIN 50 MG RE SUPP
RECTAL | Status: AC
  Filled 2021-05-26: qty 2

## 2021-05-26 MED ORDER — GLUCAGON HCL RDNA (DIAGNOSTIC) 1 MG IJ SOLR
INTRAMUSCULAR | Status: DC | PRN
  Administered 2021-05-26: .25 mg via INTRAVENOUS

## 2021-05-26 MED ORDER — AMISULPRIDE (ANTIEMETIC) 5 MG/2ML IV SOLN: 10.0000 mg | Freq: Once | INTRAVENOUS | Status: DC | PRN

## 2021-05-26 MED ORDER — FENTANYL CITRATE (PF) 100 MCG/2ML IJ SOLN
INTRAMUSCULAR | Status: AC
  Filled 2021-05-26: qty 2

## 2021-05-26 MED ORDER — DEXAMETHASONE SODIUM PHOSPHATE 10 MG/ML IJ SOLN
INTRAMUSCULAR | Status: DC | PRN
  Administered 2021-05-26: 5 mg via INTRAVENOUS

## 2021-05-26 MED ORDER — CIPROFLOXACIN IN D5W 400 MG/200ML IV SOLN
INTRAVENOUS | Status: DC | PRN
  Administered 2021-05-26: 400 mg via INTRAVENOUS

## 2021-05-26 MED ORDER — SODIUM CHLORIDE 0.9 % IV SOLN
INTRAVENOUS | Status: DC | PRN
  Administered 2021-05-26: 15 mL

## 2021-05-26 MED ORDER — LACTATED RINGERS IV SOLN: INTRAVENOUS | Status: DC

## 2021-05-26 NOTE — Op Note (Addendum)
[facility] Patient Name: [name] Procedure Date : 05/26/2021 MRN: [id number] Attending MD: Justice Britain , MD Date of Birth: August 27, 1946 CSN: [id number] Age: 75 Admit Type: Outpatient Procedure:                ERCP Indications:              Bile duct stone(s), Biliary stent removal, Postop                            exam: Cholecystectomy, Prior Endoscopic Retrograde                            Cholangiopancreatography Providers:                Justice Britain, MD, Jeanella Cara, RN,                            Laverda Sorenson, Technician, Herminio Commons Referring MD:             Arta Bruce Kinsinger MD, MD, Dibas Koirala, MD Medicines:                General Anesthesia, Cipro 400 mg IV, Glucagon 0.25                            mg IV, Indomethacin 094 mg PR Complications:            No immediate complications. Estimated Blood Loss:     Estimated blood loss: none. Procedure:                Pre-Anesthesia Assessment:                           - Prior to the procedure, a History and Physical                            was performed, and patient medications and                            allergies were reviewed. The patient's tolerance of                            previous anesthesia was also reviewed. The risks                            and benefits of the procedure and the sedation                            options and risks were discussed with the patient.                            All questions were answered, and informed consent                            was obtained. Prior Anticoagulants: The patient has  taken no previous anticoagulant or antiplatelet                            agents. ASA Grade Assessment: III - A patient with                            severe systemic disease. After reviewing the risks                            and benefits, the patient was deemed in                            satisfactory condition to  undergo the procedure.                           After obtaining informed consent, the scope was                            passed under direct vision. Throughout the                            procedure, the patient's blood pressure, pulse, and                            oxygen saturations were monitored continuously. The                            [employer] single use                            duodenoscope was introduced through the mouth, and                            used to inject contrast into and used to inject                            contrast into the bile duct. The ERCP was                            accomplished without difficulty. The patient                            tolerated the procedure. Scope In: Scope Out: Findings:      A scout film of the abdomen was obtained. Surgical clips, consistent       with previous cholecystectomy, were seen in the area of the right upper       quadrant of the abdomen. One stent ending in the main bile duct was seen.      The upper GI tract was traversed under direct vision without detailed       examination. A J-shaped deformity was found in the stomach (this       required patient to be placed in left lateral position). No other gross       lesions were noted in the entire examined  stomach. No gross lesions were       noted in the duodenal bulb, in the first portion of the duodenum and in       the second portion of the duodenum. A biliary sphincterotomy had been       performed. The sphincterotomy appeared open. One plastic biliary stent       originating in the biliary tree was emerging from the major papilla. One       stent was removed from the biliary tree using a rat-toothed forceps.      A short 0.035 inch Soft Jagwire was passed into the biliary tree. The       Hydratome sphincterotome was passed over the guidewire and the bile duct       was then deeply cannulated. Contrast was injected. I personally        interpreted the bile duct images. Ductal flow of contrast was adequate.       Image quality was adequate. Contrast extended to the hepatic ducts.       Opacification of the entire biliary tree except for the cystic duct and       gallbladder was successful. The lower third of the main bile duct       contained a filling defect thought to be sludge. The upper third of the       main bile duct, left main hepatic duct and right main hepatic duct were       moderately dilated, acquired. The largest diameter was 15 mm. The lower       third of the main bile duct and middle third of the main bile duct were       more normal in caliber. To discover objects, the biliary tree was swept       with a retrieval balloon slowly moving towards the bifurcation (using a       9-12 mm balloon and then a 15-18 mm balloon). Sludge was swept from the       duct. Four stones were removed. No stones remained. An occlusion       cholangiogram was performed that showed no further significant biliary       pathology. The balloon passed easily throughout the biliary tree in the       15 mm positioning.      A pancreatogram was not performed.      The duodenoscope was withdrawn from the patient. Impression:               - J-shaped deformity of the stomach.                           - No gross lesions in the duodenal bulb, in the                            first portion of the duodenum and in the second                            portion of the duodenum.                           - Prior biliary sphincterotomy appeared open.                           -  One stent from the biliary tree was seen in the                            major papilla. This was removed.                           - The fluoroscopic examination was suspicious for                            sludge.                           - The upper third of the main bile duct, left main                            hepatic duct and right main hepatic duct were                             moderately dilated, acquired.                           - Choledocholithiasis and biliary sludge was found.                            Complete removal was accomplished by sweeping. Recommendation:           - The patient will be observed post-procedure,                            until all discharge criteria are met.                           - Discharge patient to home.                           - Patient has a contact number available for                            emergencies. The signs and symptoms of potential                            delayed complications were discussed with the                            patient. Return to normal activities tomorrow.                            Written discharge instructions were provided to the                            patient.                           - Low fat diet.                           -  Observe patient's clinical course.                           - Watch for pancreatitis, bleeding, perforation,                            and cholangitis.                           - Check liver enzymes (AST, ALT, alkaline                            phosphatase, bilirubin) in 2 weeks.                           - Consideration of Colonoscopy for Colon Cancer                            Screening at some point later this year.                           - The findings and recommendations were discussed                            with the patient.                           - The findings and recommendations were discussed                            with the patient's family. Procedure Code(s):        --- Professional ---                           [phone], Endoscopic retrograde                            cholangiopancreatography (ERCP); with removal of                            foreign body(s) or stent(s) from biliary/pancreatic                            duct(s)                           43264, Endoscopic retrograde                             cholangiopancreatography (ERCP); with removal of                            calculi/debris from biliary/pancreatic duct(s) Diagnosis Code(s):        --- Professional ---                           K31.89, Other diseases of stomach and duodenum  Z96.89, Presence of other specified functional                            implants                           K80.50, Calculus of bile duct without cholangitis                            or cholecystitis without obstruction                           Z46.59, Encounter for fitting and adjustment of                            other gastrointestinal appliance and device                           Z09, Encounter for follow-up examination after                            completed treatment for conditions other than                            malignant neoplasm                           Z90.49, Acquired absence of other specified parts                            of digestive tract                           Z98.890, Other specified postprocedural states                           K83.8, Other specified diseases of biliary tract CPT copyright 2019 American Medical Association. All rights reserved. The codes documented in this report are preliminary and upon coder review may  be revised to meet current compliance requirements. Justice Britain, MD 05/26/2021 10:55:43 AM Number of Addenda: 0

## 2021-05-26 NOTE — Anesthesia Procedure Notes (Signed)
Procedure Name: Intubation Date/Time: 05/26/2021 9:40 AM Performed by: Thelma Comp, CRNA Pre-anesthesia Checklist: Patient identified, Emergency Drugs available, Suction available and Patient being monitored Patient Re-evaluated:Patient Re-evaluated prior to induction Oxygen Delivery Method: Circle System Utilized Preoxygenation: Pre-oxygenation with 100% oxygen Induction Type: IV induction Ventilation: Mask ventilation without difficulty and Oral airway inserted - appropriate to patient size Laryngoscope Size: Mac and 4 Grade View: Grade I Tube type: Oral Tube size: 7.5 mm Number of attempts: 1 Airway Equipment and Method: Stylet and Oral airway Placement Confirmation: ETT inserted through vocal cords under direct vision,  positive ETCO2 and breath sounds checked- equal and bilateral Secured at: 23 cm Tube secured with: Tape Dental Injury: Teeth and Oropharynx as per pre-operative assessment

## 2021-05-26 NOTE — Transfer of Care (Signed)
Immediate Anesthesia Transfer of Care Note  Patient: [name]  Procedure(s) Performed: ENDOSCOPIC RETROGRADE CHOLANGIOPANCREATOGRAPHY (ERCP) WITH PROPOFOL (N/A ) STENT REMOVAL REMOVAL OF STONES  Patient Location: PACU  Anesthesia Type:General  Level of Consciousness: drowsy and patient cooperative  Airway & Oxygen Therapy: Patient Spontanous Breathing  Post-op Assessment: Report given to RN and Post -op Vital signs reviewed and stable  Post vital signs: Reviewed and stable  Last Vitals:  Vitals Value Taken Time  BP 121/75 05/26/21 1045  Temp    Pulse 82 05/26/21 1046  Resp 17 05/26/21 1046  SpO2 97 % 05/26/21 1046  Vitals shown include unvalidated device data.  Last Pain:  Vitals:   05/26/21 0802  TempSrc: Oral  PainSc: 0-No pain         Complications: No complications documented.

## 2021-05-26 NOTE — H&P (Signed)
GASTROENTEROLOGY PROCEDURE H&P NOTE   Primary Care Physician: Lujean Amel, MD  HPI: [name] is a 75 y.o. male who presents for ERCP for biliary stent pull and clean out now that cholecystectomy has been completed for recurrent choledocholithiasis and cholecystitis.  Past Medical History:  Diagnosis Date  . Atrial fibrillation ([place])   . Avascular necrosis of bones of both hips ([place])   . 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 Cancer ([place])   . Choledocholithiasis    HX  . Colonic polyp   . Complication of anesthesia    hard to wake up, "felt like died and revived me" - just one time with knee surgery, no problems since  . COPD (chronic obstructive pulmonary disease) (HCC)    mild PFT abnormalities; normal ABG, no inhalers   . Degenerative disc disease, lumbar    HNP of the LS spine  . Diabetes mellitus    type 2 - no meds   . Dysrhythmia    A-fib  . Full dentures   . GERD (gastroesophageal reflux disease)   . Glaucoma   . H/O hiatal hernia   . Headache   . Hx MRSA infection 2010   sith splenic abcess  . 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 Non Hodgkin's lymphoma ([place]) 06/2009   chemotherapy until 11/2010  . Obesity   . Obstructive sleep apnea    does not use cpap, occasional uses oxygen qhs  . On supplemental oxygen therapy    wears at HS and PRN - 2L  . Orthostatic hypotension    lightheadness; no definate loss of consciousness  . Parkinson's disease (tremor, stiffness, slow motion, unstable posture) ([place])   . Pedal edema   . Permanent atrial fibrillation ([place]) 01/10/2011   Low thromboembolic risk   . Pneumonia   . Splenic abscess 07/2009  . Tobacco abuse    45 pack years   Past Surgical History:  Procedure Laterality Date  . ABSCESS DRAINAGE  2010   Splenic  . ANAL FISSURE 45  75years old  . BILIARY DILATION  09/02/2019   Procedure: BILIARY DILATION;  Surgeon: Rush Landmark Telford Nab., MD;  Location: [facility];  Service:  Gastroenterology;;  . BILIARY STENT PLACEMENT N/A 09/02/2019   Procedure: BILIARY STENT PLACEMENT;  Surgeon: Irving Copas., MD;  Location: [facility];  Service: Gastroenterology;  Laterality: N/A;  . BILIARY STENT PLACEMENT N/A 07/21/2020   Procedure: BILIARY STENT PLACEMENT;  Surgeon: Rush Landmark Telford Nab., MD;  Location: [facility];  Service: Gastroenterology;  Laterality: N/A;  . BIOPSY  09/02/2019   Procedure: BIOPSY;  Surgeon: Rush Landmark Telford Nab., MD;  Location: [facility];  Service: Gastroenterology;;  . CATARACT EXTRACTION     Right with lens implant  . CHOLECYSTECTOMY N/A 11/01/2020   Procedure: LAPAROSCOPIC CHOLECYSTECTOMY WITH INTRAOPERATIVE CHOLANGIOGRAM;  Surgeon: Kieth Brightly Arta Bruce, MD;  Location: [facility];  Service: General;  Laterality: N/A;  . COLONOSCOPY W/ POLYPECTOMY  2008   with snare polypectomy  . ENDOSCOPIC RETROGRADE CHOLANGIOPANCREATOGRAPHY (ERCP) WITH PROPOFOL N/A 07/21/2020   Procedure: ENDOSCOPIC RETROGRADE CHOLANGIOPANCREATOGRAPHY (ERCP) WITH PROPOFOL;  Surgeon: Rush Landmark Telford Nab., MD;  Location: [facility];  Service: Gastroenterology;  Laterality: N/A;  . ERCP N/A 09/02/2019   Procedure: ENDOSCOPIC RETROGRADE CHOLANGIOPANCREATOGRAPHY (ERCP);  Surgeon: Irving Copas., MD;  Location: [facility];  Service: Gastroenterology;  Laterality: N/A;  . EYE SURGERY     lens implant  . INGUINAL HERNIA REPAIR Left 03/09/2017  Procedure: OPEN REPAIR LEFT INGUINAL HERNIA;  Surgeon: Arta Bruce Kinsinger, MD;  Location: [facility];  Service: General;  Laterality: Left;  . INSERTION OF MESH Left 03/09/2017   Procedure: INSERTION OF MESH;  Surgeon: Arta Bruce Kinsinger, MD;  Location: [facility];  Service: General;  Laterality: Left;  . KNEE ARTHROSCOPY     right  . LYMPH NODE BIOPSY  2010   done x 2 for NHL diagnosis  . MULTIPLE TOOTH EXTRACTIONS    . REMOVAL OF STONES  09/02/2019   Procedure: REMOVAL OF STONES;  Surgeon: Rush Landmark Telford Nab., MD;   Location: [facility];  Service: Gastroenterology;;  . REMOVAL OF STONES  07/21/2020   Procedure: REMOVAL OF STONES;  Surgeon: Irving Copas., MD;  Location: [facility];  Service: Gastroenterology;;  . [name]  09/02/2019   Procedure: [name];  Surgeon: Irving Copas., MD;  Location: [facility];  Service: Gastroenterology;;  . [name]  07/21/2020   Procedure: Maricela Bo;  Surgeon: Irving Copas., MD;  Location: [facility];  Service: Gastroenterology;;  . Lavell Islam REMOVAL  07/21/2020   Procedure: STENT REMOVAL;  Surgeon: Irving Copas., MD;  Location: [facility];  Service: Gastroenterology;;  . TONSILLECTOMY    . TOTAL HIP ARTHROPLASTY  12/13/2012   Procedure: TOTAL HIP ARTHROPLASTY ANTERIOR APPROACH;  Surgeon: Mcarthur Rossetti, MD;  Location: [facility];  Service: Orthopedics;  Laterality: Left;  Left Total Hip Arthroplasty, Anterior Approach (C-Arm)   Current Facility-Administered Medications  Medication Dose Route Frequency Provider Last Rate Last Admin  . 0.9 %  sodium chloride infusion   Intravenous Continuous Mansouraty, Telford Nab., MD       Facility-Administered Medications Ordered in Other Encounters  Medication Dose Route Frequency Provider Last Rate Last Admin  . heparin lock flush 100 unit/mL  500 Units Intravenous Once Sinda Du, MD      . sodium chloride 0.9 % injection 10 mL  10 mL Intravenous PRN Sinda Du, MD       Allergies  Allergen Reactions  . Crestor [Rosuvastatin] Other (See Comments)    Severe headache  . Pred Forte [Prednisolone Acetate] Other (See Comments)    Severe burning  . Atorvastatin Other (See Comments)    Shaky, dizziness, headaches  . Codeine Other (See Comments)    Shakes    Family History  Problem Relation Age of Onset  . Heart failure Mother 14       results of chf  . Leukemia Father   . Colon cancer Sister   . Heart disease Brother   . Heart disease Brother   .  Heart disease Brother   . Heart disease Brother   . Esophageal cancer Neg Hx   . Inflammatory bowel disease Neg Hx   . Liver disease Neg Hx   . Pancreatic cancer Neg Hx   . Stomach cancer Neg Hx    Social History   Socioeconomic History  . Marital status: Divorced    Spouse name: Not on file  . Number of children: 2  . Years of education: Not on file  . Highest education level: Not on file  Occupational History  . Occupation: retired/disability  Tobacco Use  . Smoking status: Current Every Day Smoker    Packs/day: 1.00    Years: 56.00    Pack years: 56.00    Types: Cigarettes    Start date: 49  . Smokeless tobacco: Never Used  Vaping Use  . Vaping Use: Never used  Substance and Sexual Activity  .  Alcohol use: Yes    Comment: social.  . Drug use: No  . Sexual activity: Not on file  Other Topics Concern  . Not on file  Social History Narrative  . Not on file   Social Determinants of Health   Financial Resource Strain: Not on file  Food Insecurity: Not on file  Transportation Needs: Not on file  Physical Activity: Not on file  Stress: Not on file  Social Connections: Not on file  Intimate Partner Violence: Not on file    Physical Exam: Vital signs in last 24 hours: Weight:  [108.9 kg] 108.9 kg (06/01 1616)   GEN: NAD EYE: Sclerae anicteric ENT: MMM CV: Non-tachycardic GI: Soft, NT/ND NEURO:  Alert & Oriented x 3  Lab Results: No results for input(s): WBC, HGB, HCT, PLT in the last 72 hours. BMET No results for input(s): NA, K, CL, CO2, GLUCOSE, BUN, CREATININE, CALCIUM in the last 72 hours. LFT No results for input(s): PROT, ALBUMIN, AST, ALT, ALKPHOS, BILITOT, BILIDIR, IBILI in the last 72 hours. PT/INR No results for input(s): LABPROT, INR in the last 72 hours.   Impression / Plan: This is a 75 y.o.male who presents for ERCP for biliary stent pull and clean out now that cholecystectomy has been completed for recurrent choledocholithiasis and  cholecystitis.  The risks of an ERCP were discussed at length, including but not limited to the risk of perforation, bleeding, abdominal pain, post-ERCP pancreatitis (while usually mild can be severe and even life threatening).  The risks and benefits of endoscopic evaluation were discussed with the patient; these include but are not limited to the risk of perforation, infection, bleeding, missed lesions, lack of diagnosis, severe illness requiring hospitalization, as well as anesthesia and sedation related illnesses.  The patient is agreeable to proceed.    Justice Britain, MD [facility] Office # [phone]

## 2021-05-27 NOTE — Anesthesia Postprocedure Evaluation (Signed)
Anesthesia Post Note  Patient: [name]  Procedure(s) Performed: ENDOSCOPIC RETROGRADE CHOLANGIOPANCREATOGRAPHY (ERCP) WITH PROPOFOL (N/A ) STENT REMOVAL REMOVAL OF STONES     Patient location during evaluation: PACU Anesthesia Type: General Level of consciousness: awake and alert Pain management: pain level controlled Vital Signs Assessment: post-procedure vital signs reviewed and stable Respiratory status: spontaneous breathing, nonlabored ventilation, respiratory function stable and patient connected to nasal cannula oxygen Cardiovascular status: blood pressure returned to baseline and stable Postop Assessment: no apparent nausea or vomiting Anesthetic complications: no   No complications documented.  Last Vitals:  Vitals:   05/26/21 1100 05/26/21 1115  BP: (!) 154/98 132/81  Pulse: 88 77  Resp: 13 14  Temp:  (!) 36.4 C  SpO2: 98% 99%    Last Pain:  Vitals:   05/27/21 1118  TempSrc:   PainSc: 0-No pain                 Tiajuana Amass

## 2021-05-30 ENCOUNTER — Encounter (HOSPITAL_COMMUNITY): Payer: Self-pay | Admitting: Gastroenterology

## 2021-07-13 ENCOUNTER — Encounter: Payer: Medicare Other | Admitting: Vascular Surgery

## 2021-07-19 ENCOUNTER — Ambulatory Visit: Payer: Medicare Other | Admitting: Podiatry

## 2021-08-08 ENCOUNTER — Other Ambulatory Visit: Payer: Self-pay

## 2021-08-08 ENCOUNTER — Ambulatory Visit (INDEPENDENT_AMBULATORY_CARE_PROVIDER_SITE_OTHER): Payer: Medicare Other | Admitting: Podiatry

## 2021-08-08 DIAGNOSIS — M2041 Other hammer toe(s) (acquired), right foot: Secondary | ICD-10-CM

## 2021-08-08 DIAGNOSIS — M79674 Pain in right toe(s): Secondary | ICD-10-CM

## 2021-08-08 DIAGNOSIS — B351 Tinea unguium: Secondary | ICD-10-CM

## 2021-08-08 DIAGNOSIS — M79675 Pain in left toe(s): Secondary | ICD-10-CM

## 2021-08-08 DIAGNOSIS — M7742 Metatarsalgia, left foot: Secondary | ICD-10-CM

## 2021-08-08 DIAGNOSIS — E1142 Type 2 diabetes mellitus with diabetic polyneuropathy: Secondary | ICD-10-CM | POA: Diagnosis not present

## 2021-08-08 DIAGNOSIS — M2042 Other hammer toe(s) (acquired), left foot: Secondary | ICD-10-CM

## 2021-08-08 DIAGNOSIS — L84 Corns and callosities: Secondary | ICD-10-CM

## 2021-08-08 DIAGNOSIS — M7741 Metatarsalgia, right foot: Secondary | ICD-10-CM

## 2021-08-08 NOTE — Progress Notes (Signed)
  Subjective:  Patient ID: [name], male    DOB: 23-Jan-1946,  MRN: [id number]  Chief Complaint  Patient presents with   Callouses   Nail Problem   Diabetes   PAD    75 y.o. male presents with the above complaint. History confirmed with patient. Has well-controlled diabetes mellitus with an A1c of 5.5%.  He has painful neuropathy.  Calluses  are quite painful esp on right foot   Objective:  Physical Exam: warm, good capillary refill, no trophic changes or ulcerative lesions and normal DP and PT pulses.  Thickened elongated yellow toenails with dystrophy and subungual debris.  Hyperkeratosis preulcerative calluses about 1 and 5 bilaterally. Assessment:   1. Pain due to onychomycosis of toenails of both feet   2. Type 2 diabetes mellitus with diabetic polyneuropathy, without long-term current use of insulin (HCC)   3. Callus of foot   4. Hammertoe of left foot   5. Hammertoe of right foot   6. Metatarsalgia of both feet      Plan:  Patient was evaluated and treated and all questions answered.  Patient educated on diabetes. Discussed proper diabetic foot care and discussed risks and complications of disease. Educated patient in depth on reasons to return to the office immediately should he/she discover anything concerning or new on the feet. All questions answered. Discussed proper shoes as well.   Discussed the etiology and treatment options for the condition in detail with the patient. Educated patient on the topical and oral treatment options for mycotic nails. Recommended debridement of the nails today. Sharp and mechanical debridement performed of all painful and mycotic nails today. Nails debrided in length and thickness using a nail nipper to level of comfort. Discussed treatment options including appropriate shoe gear. Follow up as needed for painful nails.  All symptomatic hyperkeratoses were safely debrided with a sterile #15 blade to patient's level of comfort without  incident. We discussed preventative and palliative care of these lesions including supportive and accommodative shoegear, padding, prefabricated and custom molded accommodative orthoses, use of a pumice stone and lotions/creams daily. Urea recommended  I think he would benefit from  custom diabetic insoles and ED shoes to offload the preulcerative lesions. He will be fitted for these     Return in about 3 months (around 11/08/2021) for at risk diabetic foot care.

## 2021-08-10 ENCOUNTER — Ambulatory Visit: Payer: Medicare Other | Admitting: Podiatry

## 2021-10-18 ENCOUNTER — Ambulatory Visit: Payer: Medicare Other | Admitting: Podiatry

## 2021-10-19 ENCOUNTER — Ambulatory Visit: Payer: Medicare Other | Admitting: Podiatry

## 2021-11-14 ENCOUNTER — Ambulatory Visit: Payer: Medicare Other | Admitting: Podiatry

## 2022-02-15 DIAGNOSIS — H0102A Squamous blepharitis right eye, upper and lower eyelids: Secondary | ICD-10-CM | POA: Diagnosis not present

## 2022-02-15 DIAGNOSIS — H401111 Primary open-angle glaucoma, right eye, mild stage: Secondary | ICD-10-CM | POA: Diagnosis not present

## 2022-02-15 DIAGNOSIS — H401123 Primary open-angle glaucoma, left eye, severe stage: Secondary | ICD-10-CM | POA: Diagnosis not present

## 2022-02-15 DIAGNOSIS — Z961 Presence of intraocular lens: Secondary | ICD-10-CM | POA: Diagnosis not present

## 2022-02-15 DIAGNOSIS — H0102B Squamous blepharitis left eye, upper and lower eyelids: Secondary | ICD-10-CM | POA: Diagnosis not present

## 2022-02-15 DIAGNOSIS — H04123 Dry eye syndrome of bilateral lacrimal glands: Secondary | ICD-10-CM | POA: Diagnosis not present

## 2022-02-15 DIAGNOSIS — E119 Type 2 diabetes mellitus without complications: Secondary | ICD-10-CM | POA: Diagnosis not present

## 2022-05-01 ENCOUNTER — Ambulatory Visit: Payer: Medicare Other

## 2022-05-01 ENCOUNTER — Ambulatory Visit (INDEPENDENT_AMBULATORY_CARE_PROVIDER_SITE_OTHER): Payer: Medicare Other | Admitting: Podiatry

## 2022-05-01 DIAGNOSIS — M2042 Other hammer toe(s) (acquired), left foot: Secondary | ICD-10-CM | POA: Diagnosis not present

## 2022-05-01 DIAGNOSIS — M79675 Pain in left toe(s): Secondary | ICD-10-CM

## 2022-05-01 DIAGNOSIS — B351 Tinea unguium: Secondary | ICD-10-CM | POA: Diagnosis not present

## 2022-05-01 DIAGNOSIS — L84 Corns and callosities: Secondary | ICD-10-CM

## 2022-05-01 DIAGNOSIS — M2041 Other hammer toe(s) (acquired), right foot: Secondary | ICD-10-CM

## 2022-05-01 DIAGNOSIS — E1142 Type 2 diabetes mellitus with diabetic polyneuropathy: Secondary | ICD-10-CM

## 2022-05-01 DIAGNOSIS — M79674 Pain in right toe(s): Secondary | ICD-10-CM

## 2022-05-01 DIAGNOSIS — M7741 Metatarsalgia, right foot: Secondary | ICD-10-CM | POA: Diagnosis not present

## 2022-05-01 DIAGNOSIS — M7742 Metatarsalgia, left foot: Secondary | ICD-10-CM

## 2022-05-01 DIAGNOSIS — E119 Type 2 diabetes mellitus without complications: Secondary | ICD-10-CM

## 2022-05-01 NOTE — Progress Notes (Signed)
SITUATION ?Reason for Consult: Evaluation for Prefabricated Diabetic Shoes and Custom Diabetic Inserts. ?Patient / Caregiver Report: Patient would like well fitting shoes ? ?OBJECTIVE DATA: ?Patient History / Diagnosis:  ?  ICD-10-CM   ?1. Type 2 diabetes mellitus with diabetic polyneuropathy, without long-term current use of insulin (HCC)  E11.42   ?  ?2. Hammertoe of left foot  M20.42   ?  ?3. Hammertoe of right foot  M20.41   ?  ?4. Callus of foot  L84   ?  ? ? ?Physician Treating Diabetes:  Dibas Koirala ? ?Current or Previous Devices:   None and no history ? ?In-Person Foot Examination: ?Ulcers & Callousing:   Historical ?Deformities:    Hammertoes ?Sensation:    Compromised  ?Shoe Size:     12XW ? ?ORTHOTIC RECOMMENDATION ?Recommended Devices: ?- 1x pair prefabricated PDAC approved diabetic shoes; Patient Selected Orthofeet 36 Grey Size 12XW ?- 3x pair custom-to-patient PDAC approved vacuum formed diabetic insoles. ? ?GOALS OF SHOES AND INSOLES ?- Reduce shear and pressure ?- Reduce / Prevent callus formation ?- Reduce / Prevent ulceration ?- Protect the fragile healing compromised diabetic foot. ? ?Patient would benefit from diabetic shoes and inserts as patient has diabetes mellitus and the patient has one or more of the following conditions: ?- History of partial or complete amputation of the foot ?- History of previous foot ulceration. ?- History of pre-ulcerative callus ?- Peripheral neuropathy with evidence of callus formation ?- Foot deformity ?- Poor circulation ? ?ACTIONS PERFORMED ?Potential out of pocket cost was communicated to patient. Patient understood and consented to measurement and casting. Patient was casted for insoles via crush box and measured for shoes via brannock device. Procedure was explained and patient tolerated procedure well. All questions were answered and concerns addressed. Casts were shipped to central fabrication for HOLD until Certificate of Medical Necessity or otherwise  necessary authorization from insurance is obtained. ? ?PLAN ?Shoes are to be ordered and casts released from hold once all appropriate paperwork is complete. Patient is to be contacted and scheduled for fitting once shoes and insoles have been fabricated and received. ? ?

## 2022-05-07 ENCOUNTER — Encounter: Payer: Self-pay | Admitting: Podiatry

## 2022-05-07 NOTE — Addendum Note (Signed)
Addended bySherryle Lis, Jamilee Lafosse R on: 05/07/2022 03:14 PM ? ? Modules accepted: Level of Service ? ?

## 2022-05-07 NOTE — Progress Notes (Addendum)
?  Subjective:  ?Patient ID: [name], male    DOB: [birth year],  MRN: [id number] ? ?Chief Complaint  ?Patient presents with  ? Nail Problem  ?  Thick painful toenails, overdue 3 month follow up   ? Callouses  ? Diabetes  ? Peripheral Neuropathy  ? ? ?76 y.o. male presents with the above complaint. History confirmed with patient. Has well-controlled diabetes mellitus with an A1c of 5.5%.  He has painful neuropathy.  Calluses  are quite painful again the nails are thickened elongated and causing discomfort as well. ? ?Objective:  ?Physical Exam: ?warm, good capillary refill, no trophic changes or ulcerative lesions and normal DP and PT pulses.  Thickened elongated yellow toenails with dystrophy and subungual debris.  Hyperkeratosis preulcerative calluses about 1 and 5 bilaterally. ?Assessment:  ? ?1. Encounter for diabetic foot exam ([place])   ?2. Type 2 diabetes mellitus with diabetic polyneuropathy, without long-term current use of insulin ([place])   ?3. Pain due to onychomycosis of toenails of both feet   ?4. Callus of foot   ?5. Metatarsalgia of both feet   ?6. Hammertoe of left foot   ?7. Hammertoe of right foot   ? ? ? ?Plan:  ?Patient was evaluated and treated and all questions answered. ? ?Patient educated on diabetes.  Annual diabetic foot risk assessment was performed today.  Discussed proper diabetic foot care and discussed risks and complications of disease. Educated patient in depth on reasons to return to the office immediately should he/she discover anything concerning or new on the feet. All questions answered. Discussed proper shoes as well.  ? ?Discussed the etiology and treatment options for the condition in detail with the patient. Educated patient on the topical and oral treatment options for mycotic nails. Recommended debridement of the nails today. Sharp and mechanical debridement performed of all painful and mycotic nails today. Nails debrided in length and thickness using a nail nipper to level of  comfort. Discussed treatment options including appropriate shoe gear. Follow up as needed for painful nails. ? ?All symptomatic hyperkeratoses were safely debrided with a sterile #15 blade to patient's level of comfort without incident. We discussed preventative and palliative care of these lesions including supportive and accommodative shoegear, padding, prefabricated and custom molded accommodative orthoses, use of a pumice stone and lotions/creams daily. Urea recommended ? ?He was seen today by our pedorthist to be fitted for extra-depth diabetic shoes and offloading insoles to offload the callused areas on the fifth metatarsal. ? ? ? ?Return in about 3 months (around 08/01/2022) for at risk diabetic foot care.  ? ?

## 2022-07-10 DIAGNOSIS — G620 Drug-induced polyneuropathy: Secondary | ICD-10-CM | POA: Diagnosis not present

## 2022-07-10 DIAGNOSIS — I739 Peripheral vascular disease, unspecified: Secondary | ICD-10-CM | POA: Diagnosis not present

## 2022-07-10 DIAGNOSIS — Z79899 Other long term (current) drug therapy: Secondary | ICD-10-CM | POA: Diagnosis not present

## 2022-07-10 DIAGNOSIS — I10 Essential (primary) hypertension: Secondary | ICD-10-CM | POA: Diagnosis not present

## 2022-07-10 DIAGNOSIS — R7303 Prediabetes: Secondary | ICD-10-CM | POA: Diagnosis not present

## 2022-07-10 DIAGNOSIS — E7800 Pure hypercholesterolemia, unspecified: Secondary | ICD-10-CM | POA: Diagnosis not present

## 2022-08-07 ENCOUNTER — Ambulatory Visit (INDEPENDENT_AMBULATORY_CARE_PROVIDER_SITE_OTHER): Payer: Medicare Other | Admitting: Podiatry

## 2022-08-07 DIAGNOSIS — B351 Tinea unguium: Secondary | ICD-10-CM | POA: Diagnosis not present

## 2022-08-07 DIAGNOSIS — L84 Corns and callosities: Secondary | ICD-10-CM | POA: Diagnosis not present

## 2022-08-07 DIAGNOSIS — M79675 Pain in left toe(s): Secondary | ICD-10-CM

## 2022-08-07 DIAGNOSIS — E1142 Type 2 diabetes mellitus with diabetic polyneuropathy: Secondary | ICD-10-CM

## 2022-08-07 DIAGNOSIS — M79674 Pain in right toe(s): Secondary | ICD-10-CM | POA: Diagnosis not present

## 2022-08-07 NOTE — Progress Notes (Signed)
  Subjective:  Patient ID: [name], male    DOB: Sep 10, 1946,  MRN: [id number]  Chief Complaint  Patient presents with   Diabetes    Patient reports neuropathy pain and a callus on the right foot     76 y.o. male presents with the above complaint. History confirmed with patient. Has well-controlled diabetes mellitus with an A1c of 5.5%.  He has painful neuropathy.  Calluses  are quite painful again the nails are thickened elongated and causing discomfort as well.  The right foot remains worse than the left foot  Objective:  Physical Exam: warm, good capillary refill, no trophic changes or ulcerative lesions and normal DP and PT pulses.  Thickened elongated yellow toenails with dystrophy and subungual debris.  Hyperkeratosis preulcerative calluses about 1 and 5 bilaterally. Assessment:   1. Type 2 diabetes mellitus with diabetic polyneuropathy, without long-term current use of insulin (HCC)   2. Callus of foot   3. Pain due to onychomycosis of toenails of both feet       Plan:  Patient was evaluated and treated and all questions answered.  Patient educated on diabetes.  Annual diabetic foot risk assessment was performed today.  Discussed proper diabetic foot care and discussed risks and complications of disease. Educated patient in depth on reasons to return to the office immediately should he/she discover anything concerning or new on the feet. All questions answered. Discussed proper shoes as well.   Discussed the etiology and treatment options for the condition in detail with the patient. Educated patient on the topical and oral treatment options for mycotic nails. Recommended debridement of the nails today. Sharp and mechanical debridement performed of all painful and mycotic nails today. Nails debrided in length and thickness using a nail nipper to level of comfort. Discussed treatment options including appropriate shoe gear. Follow up as needed for painful nails.  All symptomatic  hyperkeratoses were safely debrided with a sterile #15 blade to patient's level of comfort without incident. We discussed preventative and palliative care of these lesions including supportive and accommodative shoegear, padding, prefabricated and custom molded accommodative orthoses, use of a pumice stone and lotions/creams daily. Urea recommended  We are looking into why the paperwork for his diabetic shoes has not been completed in my office in contact with his PCP for this   No follow-ups on file.

## 2022-09-06 ENCOUNTER — Encounter: Payer: Medicare Other | Admitting: Vascular Surgery

## 2022-10-25 ENCOUNTER — Other Ambulatory Visit: Payer: Self-pay

## 2022-10-25 DIAGNOSIS — I739 Peripheral vascular disease, unspecified: Secondary | ICD-10-CM

## 2022-11-08 ENCOUNTER — Encounter: Payer: Medicare Other | Admitting: Vascular Surgery

## 2022-11-09 ENCOUNTER — Ambulatory Visit (INDEPENDENT_AMBULATORY_CARE_PROVIDER_SITE_OTHER): Payer: Medicare Other | Admitting: Podiatry

## 2022-11-09 DIAGNOSIS — L84 Corns and callosities: Secondary | ICD-10-CM | POA: Diagnosis not present

## 2022-11-09 DIAGNOSIS — M79675 Pain in left toe(s): Secondary | ICD-10-CM

## 2022-11-09 DIAGNOSIS — M79674 Pain in right toe(s): Secondary | ICD-10-CM

## 2022-11-09 DIAGNOSIS — E1142 Type 2 diabetes mellitus with diabetic polyneuropathy: Secondary | ICD-10-CM | POA: Diagnosis not present

## 2022-11-09 DIAGNOSIS — B351 Tinea unguium: Secondary | ICD-10-CM

## 2022-11-09 NOTE — Progress Notes (Signed)
  Subjective:  Patient ID: [name], male    DOB: [birth year],  MRN: [id number]  Chief Complaint  Patient presents with   Diabetes    Patient reports neuropathy pain and a callus on the right foot     76 y.o. male presents with the above complaint. History confirmed with patient. Has well-controlled diabetes mellitus with an A1c of 5.5%.  Neuropathy continues to be problematic for him.  Calluses  are quite painful again the nails are thickened elongated and causing discomfort as well.    Objective:  Physical Exam: warm, good capillary refill, no trophic changes or ulcerative lesions and normal DP and PT pulses.  Abnormal sensation with loss of protective sensation to monofilament.  Thickened elongated yellow toenails with dystrophy and subungual debris.  Hyperkeratosis preulcerative calluses submetatarsal 5 bilaterally.  Assessment:   1. Type 2 diabetes mellitus with diabetic polyneuropathy, without long-term current use of insulin (HCC)   2. Callus of foot   3. Pain due to onychomycosis of toenails of both feet       Plan:  Patient was evaluated and treated and all questions answered.  Patient educated on diabetes.  Annual diabetic foot risk assessment was performed today.  Discussed proper diabetic foot care and discussed risks and complications of disease. Educated patient in depth on reasons to return to the office immediately should he/she discover anything concerning or new on the feet. All questions answered. Discussed proper shoes as well.   Discussed the etiology and treatment options for the condition in detail with the patient. Educated patient on the topical and oral treatment options for mycotic nails. Recommended debridement of the nails today. Sharp and mechanical debridement performed of all painful and mycotic nails today. Nails debrided in length and thickness using a nail nipper to level of comfort. Discussed treatment options including appropriate shoe gear. Follow  up as needed for painful nails.  All symptomatic hyperkeratoses were safely debrided with a sterile #15 blade to patient's level of comfort without incident. We discussed preventative and palliative care of these lesions including supportive and accommodative shoegear, padding, prefabricated and custom molded accommodative orthoses, use of a pumice stone and lotions/creams daily. Urea recommended    No follow-ups on file.

## 2022-12-13 ENCOUNTER — Encounter: Payer: Medicare Other | Admitting: Vascular Surgery

## 2023-02-13 ENCOUNTER — Ambulatory Visit (INDEPENDENT_AMBULATORY_CARE_PROVIDER_SITE_OTHER): Payer: Medicare Other | Admitting: Podiatry

## 2023-02-13 DIAGNOSIS — M79675 Pain in left toe(s): Secondary | ICD-10-CM | POA: Diagnosis not present

## 2023-02-13 DIAGNOSIS — E1142 Type 2 diabetes mellitus with diabetic polyneuropathy: Secondary | ICD-10-CM

## 2023-02-13 DIAGNOSIS — L84 Corns and callosities: Secondary | ICD-10-CM

## 2023-02-13 DIAGNOSIS — M79674 Pain in right toe(s): Secondary | ICD-10-CM

## 2023-02-13 DIAGNOSIS — B351 Tinea unguium: Secondary | ICD-10-CM | POA: Diagnosis not present

## 2023-02-14 NOTE — Progress Notes (Signed)
  Subjective:  Patient ID: [name], male    DOB: 1946-05-17,  MRN: [id number]  Chief Complaint  Patient presents with   Routine Foot Care    No concerns from  the patient.     77 y.o. male presents with the above complaint. History confirmed with patient. Has well-controlled diabetes mellitus with an A1c of 5.5%.  Still having symptoms of neuropathy.  Calluses are quite painful again the nails are thickened elongated and causing discomfort as well.    Objective:  Physical Exam: warm, good capillary refill, no trophic changes or ulcerative lesions and normal DP and PT pulses.  Abnormal sensation with loss of protective sensation to monofilament.  Thickened elongated yellow toenails with dystrophy and subungual debris.  Hyperkeratosis preulcerative calluses submetatarsal 5 bilaterally.  Assessment:   1. Callus of foot   2. Pain due to onychomycosis of toenails of both feet   3. Type 2 diabetes mellitus with diabetic polyneuropathy, without long-term current use of insulin ([place])       Plan:  Patient was evaluated and treated and all questions answered.  Patient educated on diabetes.  Annual diabetic foot risk assessment was performed today.  Discussed proper diabetic foot care and discussed risks and complications of disease. Educated patient in depth on reasons to return to the office immediately should he/she discover anything concerning or new on the feet. All questions answered. Discussed proper shoes as well.   Discussed the etiology and treatment options for the condition in detail with the patient. Educated patient on the topical and oral treatment options for mycotic nails. Recommended debridement of the nails today. Sharp and mechanical debridement performed of all painful and mycotic nails today. Nails debrided in length and thickness using a nail nipper to level of comfort. Discussed treatment options including appropriate shoe gear. Follow up as needed for painful  nails.  All symptomatic hyperkeratoses were safely debrided with a sterile #15 blade to patient's level of comfort without incident. We discussed preventative and palliative care of these lesions including supportive and accommodative shoegear, padding, prefabricated and custom molded accommodative orthoses, use of a pumice stone and lotions/creams daily. Urea recommended    Return in about 3 months (around 05/14/2023) for at risk diabetic foot care.

## 2023-04-12 DIAGNOSIS — G620 Drug-induced polyneuropathy: Secondary | ICD-10-CM | POA: Diagnosis not present

## 2023-04-12 DIAGNOSIS — I480 Paroxysmal atrial fibrillation: Secondary | ICD-10-CM | POA: Diagnosis not present

## 2023-04-12 DIAGNOSIS — E1151 Type 2 diabetes mellitus with diabetic peripheral angiopathy without gangrene: Secondary | ICD-10-CM | POA: Diagnosis not present

## 2023-04-12 DIAGNOSIS — I10 Essential (primary) hypertension: Secondary | ICD-10-CM | POA: Diagnosis not present

## 2023-04-12 DIAGNOSIS — E1140 Type 2 diabetes mellitus with diabetic neuropathy, unspecified: Secondary | ICD-10-CM | POA: Diagnosis not present

## 2023-04-12 DIAGNOSIS — Z79899 Other long term (current) drug therapy: Secondary | ICD-10-CM | POA: Diagnosis not present

## 2023-04-12 DIAGNOSIS — E7800 Pure hypercholesterolemia, unspecified: Secondary | ICD-10-CM | POA: Diagnosis not present

## 2023-04-12 DIAGNOSIS — D6859 Other primary thrombophilia: Secondary | ICD-10-CM | POA: Diagnosis not present

## 2023-05-14 ENCOUNTER — Ambulatory Visit: Payer: Medicare Other | Admitting: Podiatry

## 2023-05-17 ENCOUNTER — Ambulatory Visit: Payer: Medicare Other | Admitting: Podiatry

## 2023-07-12 ENCOUNTER — Ambulatory Visit: Payer: Medicare Other | Admitting: Internal Medicine

## 2023-07-19 ENCOUNTER — Ambulatory Visit: Payer: Medicare Other | Admitting: Podiatry

## 2023-07-19 DIAGNOSIS — B351 Tinea unguium: Secondary | ICD-10-CM | POA: Diagnosis not present

## 2023-07-19 DIAGNOSIS — L84 Corns and callosities: Secondary | ICD-10-CM | POA: Diagnosis not present

## 2023-07-19 DIAGNOSIS — M79675 Pain in left toe(s): Secondary | ICD-10-CM | POA: Diagnosis not present

## 2023-07-19 DIAGNOSIS — E1142 Type 2 diabetes mellitus with diabetic polyneuropathy: Secondary | ICD-10-CM

## 2023-07-19 DIAGNOSIS — M79674 Pain in right toe(s): Secondary | ICD-10-CM | POA: Diagnosis not present

## 2023-07-22 NOTE — Progress Notes (Signed)
  Subjective:  Patient ID: [name], male    DOB: [birth year],  MRN: [id number]  Chief Complaint  Patient presents with   Nail Problem    [facility] and callus on bilateral bunion    77 y.o. male presents with the above complaint. History confirmed with patient. Has well-controlled diabetes mellitus.   Calluses are quite painful again the nails are thickened elongated and causing discomfort as well.    Objective:  Physical Exam: warm, good capillary refill, no trophic changes or ulcerative lesions and normal DP and PT pulses.  Abnormal sensation with loss of protective sensation to monofilament.  Thickened elongated yellow toenails with dystrophy and subungual debris.  Hyperkeratosis preulcerative calluses submetatarsal 5 bilaterally.  Assessment:   1. Pain due to onychomycosis of toenails of both feet   2. Type 2 diabetes mellitus with diabetic polyneuropathy, without long-term current use of insulin (HCC)   3. Callus of foot       Plan:  Patient was evaluated and treated and all questions answered.    Discussed the etiology and treatment options for the condition in detail with the patient. Educated patient on the topical and oral treatment options for mycotic nails. Recommended debridement of the nails today. Sharp and mechanical debridement performed of all painful and mycotic nails today. Nails debrided in length and thickness using a nail nipper to level of comfort. Discussed treatment options including appropriate shoe gear. Follow up as needed for painful nails.  All symptomatic hyperkeratoses were safely debrided with a sterile #15 blade to patient's level of comfort without incident. We discussed preventative and palliative care of these lesions including supportive and accommodative shoegear, padding, prefabricated and custom molded accommodative orthoses, use of a pumice stone and lotions/creams daily. Urea recommended    Return in about 3 months (around 10/19/2023) for at  risk diabetic foot care.

## 2023-08-16 DIAGNOSIS — E1140 Type 2 diabetes mellitus with diabetic neuropathy, unspecified: Secondary | ICD-10-CM | POA: Diagnosis not present

## 2023-08-16 DIAGNOSIS — I10 Essential (primary) hypertension: Secondary | ICD-10-CM | POA: Diagnosis not present

## 2023-08-16 DIAGNOSIS — G252 Other specified forms of tremor: Secondary | ICD-10-CM | POA: Diagnosis not present

## 2023-08-16 DIAGNOSIS — D6869 Other thrombophilia: Secondary | ICD-10-CM | POA: Diagnosis not present

## 2023-08-16 DIAGNOSIS — Z79899 Other long term (current) drug therapy: Secondary | ICD-10-CM | POA: Diagnosis not present

## 2023-08-16 DIAGNOSIS — E1142 Type 2 diabetes mellitus with diabetic polyneuropathy: Secondary | ICD-10-CM | POA: Diagnosis not present

## 2023-08-16 DIAGNOSIS — Z0001 Encounter for general adult medical examination with abnormal findings: Secondary | ICD-10-CM | POA: Diagnosis not present

## 2023-08-16 DIAGNOSIS — E7800 Pure hypercholesterolemia, unspecified: Secondary | ICD-10-CM | POA: Diagnosis not present

## 2023-08-16 DIAGNOSIS — D649 Anemia, unspecified: Secondary | ICD-10-CM | POA: Diagnosis not present

## 2023-08-16 DIAGNOSIS — T451X5S Adverse effect of antineoplastic and immunosuppressive drugs, sequela: Secondary | ICD-10-CM | POA: Diagnosis not present

## 2023-08-16 DIAGNOSIS — Z23 Encounter for immunization: Secondary | ICD-10-CM | POA: Diagnosis not present

## 2023-08-16 DIAGNOSIS — I480 Paroxysmal atrial fibrillation: Secondary | ICD-10-CM | POA: Diagnosis not present

## 2023-08-16 DIAGNOSIS — E1151 Type 2 diabetes mellitus with diabetic peripheral angiopathy without gangrene: Secondary | ICD-10-CM | POA: Diagnosis not present

## 2023-08-16 DIAGNOSIS — G620 Drug-induced polyneuropathy: Secondary | ICD-10-CM | POA: Diagnosis not present

## 2023-08-31 ENCOUNTER — Ambulatory Visit: Payer: Medicare Other | Admitting: Internal Medicine

## 2023-10-18 ENCOUNTER — Ambulatory Visit: Payer: Medicare Other | Admitting: Podiatry

## 2023-11-01 ENCOUNTER — Ambulatory Visit (INDEPENDENT_AMBULATORY_CARE_PROVIDER_SITE_OTHER): Payer: Medicare Other | Admitting: Podiatry

## 2023-11-01 ENCOUNTER — Encounter: Payer: Self-pay | Admitting: Podiatry

## 2023-11-01 DIAGNOSIS — M79675 Pain in left toe(s): Secondary | ICD-10-CM

## 2023-11-01 DIAGNOSIS — B351 Tinea unguium: Secondary | ICD-10-CM

## 2023-11-01 DIAGNOSIS — L84 Corns and callosities: Secondary | ICD-10-CM

## 2023-11-01 DIAGNOSIS — E1142 Type 2 diabetes mellitus with diabetic polyneuropathy: Secondary | ICD-10-CM

## 2023-11-01 DIAGNOSIS — M79674 Pain in right toe(s): Secondary | ICD-10-CM | POA: Diagnosis not present

## 2023-11-01 NOTE — Progress Notes (Signed)
  Subjective:  Patient ID: [name], male    DOB: [birth year],  MRN: [id number]  Chief Complaint  Patient presents with   Diabetes    PATIENT STATES HE IS HERE FOR A CHECK UP AND TOE NAILS TO GET CUT , EVERYTHING HAS BEEN BETTER THEN USUALLY .     77 y.o. male presents with the above complaint. History confirmed with patient. Has well-controlled diabetes mellitus.   Calluses are quite painful again the nails are thickened elongated and causing discomfort as well.  Prior debridement has been helpful.  The callus on the left side is not as bad as the right side now  Objective:  Physical Exam: warm, good capillary refill, no trophic changes or ulcerative lesions and normal DP and PT pulses.  Abnormal sensation with loss of protective sensation to monofilament.  Thickened elongated yellow toenails with dystrophy and subungual debris.  Hyperkeratosis preulcerative calluses submetatarsal 5 only on the right side.  Assessment:   1. Pain due to onychomycosis of toenails of both feet   2. Callus of foot   3. Type 2 diabetes mellitus with diabetic polyneuropathy, without long-term current use of insulin (HCC)       Plan:  Patient was evaluated and treated and all questions answered.    Discussed the etiology and treatment options for the condition in detail with the patient. Educated patient on the topical and oral treatment options for mycotic nails. Recommended debridement of the nails today. Sharp and mechanical debridement performed of all painful and mycotic nails today. Nails debrided in length and thickness using a nail nipper to level of comfort. Discussed treatment options including appropriate shoe gear. Follow up as needed for painful nails.  All symptomatic hyperkeratoses were safely debrided with a sterile #15 blade to patient's level of comfort without incident. We discussed preventative and palliative care of these lesions including supportive and accommodative shoegear, padding,  prefabricated and custom molded accommodative orthoses, use of a pumice stone and lotions/creams daily. Urea recommended    No follow-ups on file.

## 2024-01-02 ENCOUNTER — Other Ambulatory Visit (HOSPITAL_BASED_OUTPATIENT_CLINIC_OR_DEPARTMENT_OTHER): Payer: Self-pay | Admitting: Family Medicine

## 2024-01-02 DIAGNOSIS — Z85028 Personal history of other malignant neoplasm of stomach: Secondary | ICD-10-CM | POA: Diagnosis not present

## 2024-01-02 DIAGNOSIS — R1084 Generalized abdominal pain: Secondary | ICD-10-CM | POA: Diagnosis not present

## 2024-01-02 DIAGNOSIS — Z8572 Personal history of non-Hodgkin lymphomas: Secondary | ICD-10-CM | POA: Diagnosis not present

## 2024-01-02 DIAGNOSIS — R634 Abnormal weight loss: Secondary | ICD-10-CM

## 2024-01-02 DIAGNOSIS — I480 Paroxysmal atrial fibrillation: Secondary | ICD-10-CM | POA: Diagnosis not present

## 2024-01-02 DIAGNOSIS — G4733 Obstructive sleep apnea (adult) (pediatric): Secondary | ICD-10-CM | POA: Diagnosis not present

## 2024-01-02 DIAGNOSIS — R438 Other disturbances of smell and taste: Secondary | ICD-10-CM | POA: Diagnosis not present

## 2024-01-02 DIAGNOSIS — R3129 Other microscopic hematuria: Secondary | ICD-10-CM | POA: Diagnosis not present

## 2024-01-03 ENCOUNTER — Ambulatory Visit (HOSPITAL_COMMUNITY)
Admission: RE | Admit: 2024-01-03 | Discharge: 2024-01-03 | Disposition: A | Discharge status: Home / Self Care | Payer: Medicare Other | Source: Ambulatory Visit | Attending: Family Medicine | Admitting: Family Medicine

## 2024-01-03 DIAGNOSIS — C4490 Unspecified malignant neoplasm of skin, unspecified: Secondary | ICD-10-CM | POA: Diagnosis not present

## 2024-01-03 DIAGNOSIS — Z85028 Personal history of other malignant neoplasm of stomach: Secondary | ICD-10-CM | POA: Diagnosis not present

## 2024-01-03 DIAGNOSIS — J439 Emphysema, unspecified: Secondary | ICD-10-CM | POA: Diagnosis not present

## 2024-01-03 DIAGNOSIS — R634 Abnormal weight loss: Secondary | ICD-10-CM | POA: Insufficient documentation

## 2024-01-03 DIAGNOSIS — Z8572 Personal history of non-Hodgkin lymphomas: Secondary | ICD-10-CM | POA: Insufficient documentation

## 2024-01-03 DIAGNOSIS — C169 Malignant neoplasm of stomach, unspecified: Secondary | ICD-10-CM | POA: Diagnosis not present

## 2024-01-03 DIAGNOSIS — C8590 Non-Hodgkin lymphoma, unspecified, unspecified site: Secondary | ICD-10-CM | POA: Diagnosis not present

## 2024-01-03 MED ORDER — IOHEXOL 300 MG/ML  SOLN
100.0000 mL | Freq: Once | INTRAMUSCULAR | Status: AC | PRN
  Administered 2024-01-03: 100 mL via INTRAVENOUS

## 2024-01-03 MED ORDER — IOHEXOL 300 MG/ML  SOLN
30.0000 mL | Freq: Once | INTRAMUSCULAR | Status: AC | PRN
  Administered 2024-01-03: 30 mL via ORAL

## 2024-01-10 ENCOUNTER — Inpatient Hospital Stay: Payer: Medicare Other

## 2024-01-10 ENCOUNTER — Inpatient Hospital Stay: Payer: Medicare Other | Admitting: Hematology

## 2024-06-10 DIAGNOSIS — R54 Age-related physical debility: Secondary | ICD-10-CM | POA: Diagnosis not present

## 2024-06-10 DIAGNOSIS — R918 Other nonspecific abnormal finding of lung field: Secondary | ICD-10-CM | POA: Diagnosis not present

## 2024-06-10 DIAGNOSIS — G4733 Obstructive sleep apnea (adult) (pediatric): Secondary | ICD-10-CM | POA: Diagnosis not present

## 2024-06-10 DIAGNOSIS — I480 Paroxysmal atrial fibrillation: Secondary | ICD-10-CM | POA: Diagnosis not present

## 2024-06-10 DIAGNOSIS — C3411 Malignant neoplasm of upper lobe, right bronchus or lung: Secondary | ICD-10-CM | POA: Diagnosis not present

## 2024-06-10 DIAGNOSIS — J438 Other emphysema: Secondary | ICD-10-CM | POA: Diagnosis not present

## 2024-08-08 DIAGNOSIS — R17 Unspecified jaundice: Secondary | ICD-10-CM | POA: Diagnosis not present

## 2024-08-08 DIAGNOSIS — C676 Malignant neoplasm of ureteric orifice: Secondary | ICD-10-CM | POA: Diagnosis not present

## 2024-08-08 DIAGNOSIS — C3491 Malignant neoplasm of unspecified part of right bronchus or lung: Secondary | ICD-10-CM | POA: Diagnosis not present

## 2024-08-09 DIAGNOSIS — K219 Gastro-esophageal reflux disease without esophagitis: Secondary | ICD-10-CM | POA: Diagnosis not present

## 2024-08-09 DIAGNOSIS — C676 Malignant neoplasm of ureteric orifice: Secondary | ICD-10-CM | POA: Diagnosis not present

## 2024-08-09 DIAGNOSIS — E46 Unspecified protein-calorie malnutrition: Secondary | ICD-10-CM | POA: Diagnosis not present

## 2024-08-09 DIAGNOSIS — E119 Type 2 diabetes mellitus without complications: Secondary | ICD-10-CM | POA: Diagnosis not present

## 2024-08-09 DIAGNOSIS — C859A Non-Hodgkin lymphoma, unspecified, in remission: Secondary | ICD-10-CM | POA: Diagnosis not present

## 2024-08-09 DIAGNOSIS — I10 Essential (primary) hypertension: Secondary | ICD-10-CM | POA: Diagnosis not present

## 2024-08-09 DIAGNOSIS — C3491 Malignant neoplasm of unspecified part of right bronchus or lung: Secondary | ICD-10-CM | POA: Diagnosis not present

## 2024-08-09 DIAGNOSIS — G629 Polyneuropathy, unspecified: Secondary | ICD-10-CM | POA: Diagnosis not present

## 2024-08-09 DIAGNOSIS — G4730 Sleep apnea, unspecified: Secondary | ICD-10-CM | POA: Diagnosis not present

## 2024-08-09 DIAGNOSIS — J4489 Other specified chronic obstructive pulmonary disease: Secondary | ICD-10-CM | POA: Diagnosis not present

## 2024-08-09 DIAGNOSIS — R17 Unspecified jaundice: Secondary | ICD-10-CM | POA: Diagnosis not present

## 2024-08-09 DIAGNOSIS — I4891 Unspecified atrial fibrillation: Secondary | ICD-10-CM | POA: Diagnosis not present

## 2024-08-25 DEATH — deceased
